# Patient Record
Sex: Female | Born: 1955 | Race: White | Hispanic: No | Marital: Single | Smoking: Current every day smoker
Health system: Southern US, Community
[De-identification: ages and names within clinical notes are randomized; demographics above are authoritative.]

## PROBLEM LIST (undated history)

## (undated) DIAGNOSIS — I272 Pulmonary hypertension, unspecified: Secondary | ICD-10-CM

## (undated) DIAGNOSIS — Z720 Tobacco use: Secondary | ICD-10-CM

## (undated) DIAGNOSIS — C50919 Malignant neoplasm of unspecified site of unspecified female breast: Secondary | ICD-10-CM

## (undated) DIAGNOSIS — E669 Obesity, unspecified: Secondary | ICD-10-CM

## (undated) DIAGNOSIS — M1990 Unspecified osteoarthritis, unspecified site: Secondary | ICD-10-CM

## (undated) DIAGNOSIS — J449 Chronic obstructive pulmonary disease, unspecified: Secondary | ICD-10-CM

## (undated) DIAGNOSIS — S0291XA Unspecified fracture of skull, initial encounter for closed fracture: Secondary | ICD-10-CM

## (undated) DIAGNOSIS — K449 Diaphragmatic hernia without obstruction or gangrene: Secondary | ICD-10-CM

## (undated) DIAGNOSIS — F32A Depression, unspecified: Secondary | ICD-10-CM

## (undated) DIAGNOSIS — F419 Anxiety disorder, unspecified: Secondary | ICD-10-CM

## (undated) DIAGNOSIS — R079 Chest pain, unspecified: Secondary | ICD-10-CM

## (undated) DIAGNOSIS — IMO0001 Reserved for inherently not codable concepts without codable children: Secondary | ICD-10-CM

## (undated) DIAGNOSIS — F329 Major depressive disorder, single episode, unspecified: Secondary | ICD-10-CM

## (undated) DIAGNOSIS — I10 Essential (primary) hypertension: Secondary | ICD-10-CM

## (undated) DIAGNOSIS — H9190 Unspecified hearing loss, unspecified ear: Secondary | ICD-10-CM

## (undated) DIAGNOSIS — I890 Lymphedema, not elsewhere classified: Secondary | ICD-10-CM

## (undated) DIAGNOSIS — K219 Gastro-esophageal reflux disease without esophagitis: Secondary | ICD-10-CM

## (undated) DIAGNOSIS — E785 Hyperlipidemia, unspecified: Secondary | ICD-10-CM

## (undated) HISTORY — DX: Chest pain, unspecified: R07.9

## (undated) HISTORY — DX: Tobacco use: Z72.0

## (undated) HISTORY — DX: Depression, unspecified: F32.A

## (undated) HISTORY — DX: Diaphragmatic hernia without obstruction or gangrene: K44.9

## (undated) HISTORY — PX: EYE SURGERY: SHX253

## (undated) HISTORY — PX: BACK SURGERY: SHX140

## (undated) HISTORY — DX: Unspecified fracture of skull, initial encounter for closed fracture: S02.91XA

## (undated) HISTORY — PX: TONSILLECTOMY: SUR1361

## (undated) HISTORY — DX: Essential (primary) hypertension: I10

## (undated) HISTORY — DX: Major depressive disorder, single episode, unspecified: F32.9

## (undated) HISTORY — DX: Unspecified hearing loss, unspecified ear: H91.90

## (undated) HISTORY — DX: Anxiety disorder, unspecified: F41.9

## (undated) HISTORY — DX: Obesity, unspecified: E66.9

## (undated) HISTORY — DX: Hyperlipidemia, unspecified: E78.5

## (undated) HISTORY — DX: Reserved for inherently not codable concepts without codable children: IMO0001

## (undated) HISTORY — DX: Lymphedema, not elsewhere classified: I89.0

---
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
## 1988-06-30 HISTORY — PX: MASTECTOMY: SHX3

## 1992-06-30 HISTORY — PX: TOTAL ABDOMINAL HYSTERECTOMY W/ BILATERAL SALPINGOOPHORECTOMY: SHX83

## 1994-06-30 HISTORY — PX: SHOULDER SURGERY: SHX246

## 1994-06-30 HISTORY — PX: CHOLECYSTECTOMY: SHX55

## 1994-06-30 HISTORY — PX: CARPAL TUNNEL RELEASE: SHX101

## 1997-06-30 HISTORY — PX: KNEE SURGERY: SHX244

## 2005-10-29 ENCOUNTER — Ambulatory Visit: Payer: Self-pay | Admitting: Internal Medicine

## 2005-11-12 ENCOUNTER — Ambulatory Visit: Payer: Self-pay | Admitting: Internal Medicine

## 2005-12-09 ENCOUNTER — Ambulatory Visit (HOSPITAL_COMMUNITY): Admission: RE | Admit: 2005-12-09 | Discharge: 2005-12-09 | Payer: Self-pay | Admitting: Internal Medicine

## 2005-12-09 ENCOUNTER — Ambulatory Visit: Payer: Self-pay | Admitting: Internal Medicine

## 2005-12-09 ENCOUNTER — Encounter (INDEPENDENT_AMBULATORY_CARE_PROVIDER_SITE_OTHER): Payer: Self-pay | Admitting: *Deleted

## 2006-01-19 ENCOUNTER — Ambulatory Visit: Payer: Self-pay | Admitting: Internal Medicine

## 2006-03-03 ENCOUNTER — Ambulatory Visit (HOSPITAL_COMMUNITY): Admission: RE | Admit: 2006-03-03 | Discharge: 2006-03-03 | Payer: Self-pay | Admitting: Internal Medicine

## 2006-04-23 ENCOUNTER — Ambulatory Visit: Payer: Self-pay | Admitting: Internal Medicine

## 2006-05-15 ENCOUNTER — Ambulatory Visit: Payer: Self-pay | Admitting: Internal Medicine

## 2006-05-15 ENCOUNTER — Ambulatory Visit (HOSPITAL_COMMUNITY): Admission: RE | Admit: 2006-05-15 | Discharge: 2006-05-15 | Payer: Self-pay | Admitting: Internal Medicine

## 2006-09-10 ENCOUNTER — Ambulatory Visit: Payer: Self-pay | Admitting: Internal Medicine

## 2006-10-14 ENCOUNTER — Emergency Department (HOSPITAL_COMMUNITY): Admission: EM | Admit: 2006-10-14 | Discharge: 2006-10-14 | Payer: Self-pay | Admitting: Emergency Medicine

## 2006-11-27 ENCOUNTER — Ambulatory Visit: Payer: Self-pay | Admitting: Cardiology

## 2006-12-01 ENCOUNTER — Ambulatory Visit: Payer: Self-pay | Admitting: Internal Medicine

## 2006-12-02 ENCOUNTER — Ambulatory Visit: Payer: Self-pay | Admitting: Cardiology

## 2006-12-02 ENCOUNTER — Inpatient Hospital Stay (HOSPITAL_BASED_OUTPATIENT_CLINIC_OR_DEPARTMENT_OTHER): Admission: RE | Admit: 2006-12-02 | Discharge: 2006-12-03 | Payer: Self-pay | Admitting: Cardiology

## 2006-12-10 ENCOUNTER — Ambulatory Visit: Payer: Self-pay | Admitting: Cardiology

## 2007-12-23 ENCOUNTER — Ambulatory Visit: Payer: Self-pay | Admitting: Internal Medicine

## 2008-02-04 ENCOUNTER — Encounter: Admission: RE | Admit: 2008-02-04 | Discharge: 2008-02-04 | Payer: Self-pay | Admitting: Obstetrics and Gynecology

## 2009-01-11 ENCOUNTER — Encounter: Payer: Self-pay | Admitting: Gastroenterology

## 2009-06-30 HISTORY — PX: COLONOSCOPY: SHX174

## 2010-11-12 NOTE — Assessment & Plan Note (Signed)
[facility]                          [facility]   JERILYNN, FELDMEIER                         MRN:          [id number]  DATE:12/10/2006                            DOB:          04/19/1956    Kathleen Burns returns after catheterization.  She is doing well.  See my  complete note dated Nov 27, 2006.  I saw her in the office then, and  there is an extensive dictation by Tereso Newcomer PA-C.  We decided to  proceed with catheterization.  This was done as an outpatient at [facility].  It was done on December 02, 2006.  Ejection fraction was normal.  Coronaries were normal.  There was no valvular disease.  It was felt  that her coronary status was quite good.  She was discharged home.  Since then, she has had no problems at her catheterization site.  She  has been stable, but still has some chest discomfort.  This is not  cardiac.   PAST MEDICAL HISTORY:   ALLERGIES:  1. PROCARDIA.  2. INDERAL.  3. ASPIRIN.  4. NSAIDS.  5. COGENTIN.  6. ARTANE.  7. PAXIL.  8. LEVAQUIN.  9. PENICILLIN.  10.DEMEROL.   MEDICATIONS:  Botox, Celebrex, Ambien, Ativan, Avinza, albuterol,  Xopenex, hydrochlorothiazide, Prilosec, Zocor.   OTHER MEDICAL PROBLEMS:  See the list on the note of Nov 27, 2006.   PHYSICAL EXAMINATION:  Blood pressure today is 138/95.  She needs blood  pressure followup.  Her heart rate is 87.   IMPRESSION:  1. Chest discomfort.  Not cardiac.  2. Catheterization done December 02, 2006 showing normal coronary arteries,      and normal left ventricular function.  No further cardiac workup is      needed.  3. Hypertension.  4. Hyperlipidemia.  5. Tobacco abuse.  6. Chronic obstructive pulmonary disease.  7. History of breast cancer, status post left mastectomy in 1990.  8. Aspirin intolerance.  9. History of blepharospasm and Meige syndrome followed by Dr.      Kate Sable at [facility].  10.History of depression and anxiety, and a previous  suicide attempt.  11.Gastroesophageal reflux disease.  12.Peptic ulcer disease.  13.Fatigue.  14.Hypersomnolence.   [name] is stable.  No further cardiac workup is needed at this time.  She is to return to her doctors in [place].     Luis Abed, MD, [facility]  Electronically Signed    JDK/MedQ  DD: 12/10/2006  DT: 12/10/2006  Job #: [id number]   cc:   Reynolds Bowl, MD

## 2010-11-12 NOTE — Assessment & Plan Note (Signed)
[facility]                          [facility]   [name], [name]                         MRN:          [id number]  DATE:11/27/2006                            DOB:          1955/09/24    REFERRING PHYSICIAN:  KRISTI POWERS, PA-C   CARDIOLOGIST:  She will be new to Dr. Myrtis Ser.   REASON FOR REFERRAL:  Chest pain.   HISTORY OF PRESENT ILLNESS:  [name] is a 55 year old female patient  with an extensive past medical history as outlined below.  She has no  proven coronary artery disease in the past.  She does tell me that she  had a heart catheterization in the late 1990s, as well as either in 2003  or 2004 in [place], [place].  She was previously seeing a cardiologist  in [place] named Dr. Dalbert Mayotte.  Apparently, she was told that she  had no blockages but possibly had spasm as a cause for her chest pain.  She had a stress test prior to her heart catheterization.  She is unsure  about the results for heart catheterization.  She recently saw her  primary care Izayiah Tibbitts with complaints of chest discomfort and she was  referred to [name] for further evaluation.   The patient reports a history that is somewhat consistent with chronic  chest discomfort; however, since March of this year she has noticed  worsening in her discomfort.  It is a left-sided pressure.  It does  radiate to her shoulder.  When asked what it feels like, she says it  feels like an elephant sitting on her chest.  She does note it with  exertion.  When asked what would happen when she goes up steps she says,  it feels like it will kill me.  She notes associated shortness of  breath, nausea, and diaphoresis.  She notes the pain will last from 2-20  minutes.  She notes it goes away with rest.  She denies any syncope.  She does feel lightheaded at times with it.  She does note that she can  sometimes make it worse with bending over.  She also notes tachy  palpitations associated with her chest discomfort.  She denies any  pleuritic chest pain.  She denies any association with meals.  Other  than the bending over, she denies any changes with positioning.   PAST MEDICAL HISTORY:  1. Prior cardiac catheterization in either 2003 or 2004 with,      apparently, no coronary disease.  Question of spasm.  Records      pending.  2. Hypertension.  3. Treated dyslipidemia.  4. Gastroesophageal reflux disease.  5. Peptic ulcer disease.  History of intolerance to ASPIRIN and NSAIDs      in the past.  6. Chronic obstructive pulmonary disease.  7. Breast carcinoma, status post left mastectomy 1990.  8. Depression/anxiety.  History of previous suicide attempt.  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  Followed by Dr.      Kate Sable at [facility].  10.Status post multiple surgeries including mastectomy of the  left      breast, back surgery 1998, cholecystectomy 1996, total abdominal      hysterectomy and bilateral salpingo-oophorectomy 1994, left      shoulder surgery in 1996, carpal tunnel surgery in 1996, left knee      surgery 1999, partial myomectomy bilateral eyes, 1999 and 2001, and      tonsillectomy.  11.Osteoarthritis.  12.History of skull fracture, 1969.  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   CURRENT MEDICATIONS:  1. Botox injections every 3 months.  2. Celebrex 100 mg b.i.d.  3. Ambien CR 12.5 mg nightly.  4. Ativan 1 mg 3 times a day.  5. Avinza 120 mg b.i.d.  6. Albuterol 2 puffs q.i.d.  7. Xopenex 1.25 mg q.8 h. p.r.n.  8. Hydrochlorothiazide 25 mg daily.  9. Prilosec 20 mg 2 tablets daily.  10.Zocor 20 mg daily.  11.Norco 10/325 mg p.r.n. pain  12.Tylenol p.r.n. pain.   ALLERGIES:  She has an extensive list including PROCARDIA, INDERAL  causing psychosis, ASPIRIN and ALL NSAIDs causing GI upset and  exacerbation of peptic ulcer disease, COGENTIN causing  rapid heartbeat,  ARTANE causing rapid heartbeat, PAXIL causing anxiety, LEVAQUIN causing  muscle weakness, PENICILLIN causing a rash, DEMEROL causing the opposite  effect.   SOCIAL HISTORY:  She smokes cigarettes 1-1/2 packs per day for 20 years  for a 30-pack-year history.  She denies alcohol abuse.  She did use  marijuana at some point, but denies any current drug abuse.  She is  disabled secondary to her extensive medical history.  She is a previous  case Production designer, theatre/television/film with [employer].  She is not married, she has no children.   FAMILY HISTORY:  Significant for coronary artery disease.  Her father  died at age 55 with a myocardial infarction.  Her mother died of a  pulmonary embolus post surgery.   REVIEW OF SYSTEMS:  Please see HPI.  She notes that she sleeps on 5  pillows.  She has done this for years.  She notes she has had to add  some extra pillows recently.  She denies paroxysmal nocturnal dyspnea.  She has gained about 50 pounds in the last 9 months.  She denies any  melena, hematochezia, hematuria, dysuria.  Denies any unilateral  weakness, monocular blindness, facial drooping or difficulty of speech.  She does have occasional edema in her legs.  Her friend that is with her  today does note that she snores.  There have been no witnessed apneic  episodes.  The patient does admit to awaking with headaches.  She does  also admit to daytime hypersomnolence.  The rest of the review of  systems are negative.   PHYSICAL EXAMINATION:  She is a well-nourished, well-developed female in  no distress.  Blood pressure 118/93, pulse 100, weight 249.8 pounds.  HEENT:  Normal.  NECK:  Without JVD, endocrine without thyromegaly, carotids without  bruits bilaterally.  CARDIAC:  Normal S1, S2.  Regular rate and rhythm without murmurs,  clicks, rubs, or gallops.  LUNGS:  Clear to auscultation bilaterally without wheezing, rhonchi, or  rales. ABDOMEN:  Soft, nontender, with normoactive bowel  sounds.  No  organomegaly and no bruits.  EXTREMITIES:  With 1-2+ nonpitting edema bilaterally.  Calves are soft,  nontender.  SKIN:  Warm and dry.  NEUROLOGIC:  She is alert and oriented x3.  Cranial nerves II-XII are  grossly intact.  Electrocardiogram reveals sinus rhythm with a heart rate of 96.  Normal  axis.  Nonspecific ST-T wave changes.   IMPRESSION:  1. Exertional chest discomfort, concerning for angina pectoris.  2. Prior history of cardiac catheterization in either 2003 or 2004.      Apparently negative at that time.      a.     Question vasospasm.      b.     Records pending.  3. Hypertension.  4. Hyperlipidemia.  5. Tobacco abuse.  6. Family history of coronary artery disease.  7. Chronic obstructive pulmonary disease.  8. Breast carcinoma, status post left mastectomy in 1990.  9. ASPIRIN intolerance.  10.History of blepharospasm and Meige syndrome, followed by Dr.      Kate Sable at [facility].  11.History of depression/anxiety with previous suicide attempt.  12.Multiple surgeries as outlined above.  13.Gastroesophageal reflux disease/peptic ulcer disease.  14.Fatigue and hypersomnolence.   PLAN:  The patient presents to the office today for evaluation of chest  pain.  She apparently had a workup in the past and was seeing a  cardiologist in [place], but does not prefer to go back to [facility].  Per her report, she had a negative cardiac catheterization  some 4-5 years ago.  However, she describes chest pain symptoms that are  fairly concerning for exertional angina pectoris.  She does have some  atypical features, but her typical features are much greater.  She does  have significant risk factors for coronary artery disease.  At this  point in time, we have recommended proceeding with cardiac  catheterization to rule out obstructive coronary artery disease.  We  will arrange for her to go to [facility] in [place] sometime next  week for  outpatient cardiac catheterization.  Risks and benefits have  been explained to the patient, she agrees to proceed.   After further evaluation with cardiac catheterization, we will need to  consider future evaluation for sleep apnea.  She certainly has the body  habitus for it, and is certainly describing symptoms consistent with  this.  Will consider this at followup appointment.      Tereso Newcomer, PA-C  Electronically Signed      Luis Abed, MD, [facility]  Electronically Signed   SW/MedQ  DD: 11/27/2006  DT: 11/27/2006  Job #: [id number]   cc:   Michaele Offer, PA-C

## 2010-11-12 NOTE — Assessment & Plan Note (Signed)
NAMEMarland Kitchen  Kathleen Burns, Kathleen Burns                   CHART#:  [id number]   DATE:  12/23/2007                       DOB:  Dec 11, 1955   REASON FOR VISIT:  Followup constipation, predominant IBS, and GERD.   HISTORY OF PRESENT ILLNESS:  The patient has done well on Prilosec 20 mg  orally b.i.d.  She was doing well on normal dosing of Amitiza 24 mcg  daily, but she ran out of this agent several months ago.  She had  negative cardiac evaluation by Dr. Collier Bullock at [place].  She has become  constipated once again, but again, her reflux symptoms are well  controlled.  She has been able to accomplish significant weight loss,  for which she was commended.  She weighed 249.5 on 12/01/2007.  She now  weighs 212 pounds.  She has not had any intercurrent illnesses.   CURRENT MEDICATIONS:  See updated list.   ALLERGIES:  Procardia, Inderal, aspirin, Cogentin, Paxil, Levaquin,  penicillin__________.   PHYSICAL EXAMINATION:  GENERAL:  She is accompanied by her partner.  VITAL SIGNS:  Weight 212, height 5 feet 5 inches, temp 98, BP 132/90,  and pulse 96.  SKIN:  Warm and dry.  ABDOMEN:  Somewhat obese.  Positive bowel sounds.  Soft and entirely  nontender without any appreciable mass or hepatosplenomegaly.   ASSESSMENT:  1. Gastroesophageal reflux disease symptoms, well controlled on      Prilosec.  2. Irritable bowel syndrome-C, previously responsive to Amitiza, 24-      mcg dose was a bit much for daily dosing for this nice lady.   RECOMMENDATIONS:  We will begin Amitiza 8 mcg b.i.d.  She is to take it  with meals, i.e., breakfast and supper, warned about headache, nausea,  and diarrhea.  I have also given her prescription as well as samples.  Unless something comes up, plan to see this nice lady back in a year and  will slate her for a routine screening colonoscopy in 2017.      Jonathon Bellows, M.D.  Electronically Signed    RMR/MEDQ  D:  12/23/2007  T:  12/24/2007  Job:  [id number]   cc:   Abbey Chatters.  Jorene Guest, MD

## 2010-11-12 NOTE — Assessment & Plan Note (Signed)
NAMEDEVINN, VOSHELL                   CHART#:  [id number]   DATE:  12/01/2006                       DOB:  [birth year]   Last seen on September 10, 2006.  Follow up for constipation, predominant  IBS, GERD.   We started her on some Amitiza 1 24 mcg tablet each day with food.  She  took it for about four days, got diarrhea, and stopped taking it;  however, she has learned that she ends up taking it once daily 2-3 days  in a row every 3-4 days on a p.r.n. basis for constipation.  This has  worked very well for her.  She is having on the order of 3-4 bowel  movements weekly and is very pleased, really not having any abdominal  discomfort.  Reflux symptoms are controlled on Prilosec OTC 20 mg orally  b.i.d.  She has developed a new problem since March, namely that of  dyspnea and chest pain on exertion on walking as little as 50 feet at  one time.  She has seen Dr. Myrtis Ser of [facility] up in [place].  She  is slated to have a cardiac catheterization tomorrow down in [place].  Has not seen Dr. Jorene Guest recently.  All in all from a chest standpoint,  she is doing well.  She has lost 2-1/2 pounds since her last visit.   CURRENT MEDICATIONS:  See updated list.   ALLERGIES:  PROCARDIA, INDERAL, ASA, COGENTIN, ARTANE, PAXIL, LEVAQUIN,  PENICILLIN, DEMEROL.   PHYSICAL EXAMINATION:  VITAL SIGNS:  She appears at her baseline weight  of 249.5.  Height 5 feet 6.  Temp 97.7, BP 130/80, pulse 80.  SKIN:  Warm and dry.  ABDOMEN:  Obese.  Positive bowel sounds.  Soft, nontender without  appreciable mass or organomegaly.   ASSESSMENT:  Gastroesophageal reflux disease symptoms that are  responsive to Prilosec 20 mg OTC b.i.d., constipation, predominant IBS,  now on a novel regimen of p.r.n. Amitiza use.  As long as Amitiza is  working for her in this manner, I see no reason why she cannot continue  taking it on a p.r.n. basis.  I have encouraged weight loss, antireflux,  lifestyle, continue Prilosec  20 mg OTC b.i.d. for as long as needed.  Unless something comes up, plan to see this nice lady back in the office  in one year.       Jonathon Bellows, M.D.  Electronically Signed    RMR/MEDQ  D:  12/01/2006  T:  12/01/2006  Job:  [id number]   cc:   Dr. Reynolds Bowl

## 2010-11-12 NOTE — Cardiovascular Report (Signed)
NAME:  AKIRE, RENNERT NO.:  [id number]   MEDICAL RECORD NO.:  [id number]          PATIENT TYPE:  OIB   LOCATION:  NA                           FACILITY:  [facility]   PHYSICIAN:  Salvadore Farber, MD  DATE OF BIRTH:  1956-02-11   DATE OF PROCEDURE:  12/02/2006  DATE OF DISCHARGE:                            CARDIAC CATHETERIZATION   PROCEDURES:  1. Left heart catheterization.  2. Left ventriculography.  3. Coronary angiography.   INDICATIONS:  Ms. Nester is a 55 year old woman with longstanding chest  pain occurring primarily at rest.  However, over the past 3 months she  has had substernal chest pressure with exertion.  In addition, she has  risk factors of hypertension, hypercholesterolemia, tobacco abuse and a  borderline family history of father dying of a myocardial infarction at  age 24.   PROCEDURAL TECHNIQUE:  Informed consent was obtained.  Under 1%  lidocaine local anesthesia, a 4-French sheath was placed in the right  common femoral artery using the modified Seldinger technique.  Diagnostic angiography and ventriculography were performed using JL-4,  JR-4, and pigtail catheters.  The patient tolerated the procedure well  and was transferred to the holding room in stable condition.  Sheaths  will be removed there.   COMPLICATIONS:  None.   FINDINGS:  1. LV:  138/0/6.  EF 65% without regional wall motion abnormality.  2. No aortic stenosis or mitral regurgitation.  3. Left main:  Angiographically normal.  4. LAD:  Moderate-sized vessel giving rise to two diagonals.  It is      angiographically normal.  5. Circumflex:  Moderate-sized vessel giving rise to a single      marginal.  It is angiographically normal.  6. RCA:  Moderate-sized, dominant vessel.  It is angiographically      normal.   IMPRESSION/PLAN:  Normal coronary arteries and normal left ventricular  size and systolic function.  There is no aortic stenosis or mitral  regurgitation.  The  patient will follow up with HER primary care  physician.      Salvadore Farber, MD  Electronically Signed     WED/MEDQ  D:  12/02/2006  T:  12/02/2006  Job:  [id number]

## 2010-11-15 NOTE — Op Note (Signed)
NAME:  Kathleen Burns, Kathleen Burns                ACCOUNT NO.:  [id number]   MEDICAL RECORD NO.:  [id number]          PATIENT TYPE:  AMB   LOCATION:  DAY                           FACILITY:  [facility]   PHYSICIAN:  R. Roetta Sessions, M.D. DATE OF BIRTH:  [birth year]   DATE OF PROCEDURE:  12/09/2005  DATE OF DISCHARGE:                                 OPERATIVE REPORT   PROCEDURE:  EGD and Maloney dilation followed by incomplete colonoscopy,  segmental biopsy, stool sampling.   INDICATIONS FOR PROCEDURE:  Patient is a 55 year old lady with esophageal  dysphagia in a background setting of gastroesophageal reflux disease  symptoms.  She has a history of chronic diarrhea.  She is Hemoccult  negative.  EGD and colonoscopy now being done.  This procedure has been  discussed with the patient at length and its risks, benefits, and  alternatives have been reviewed.  Questions answered and consent given.  Please see documentation on medical record.   DESCRIPTION OF PROCEDURE:  O2 saturation, blood pressure, pulse,  respirations were monitored throughout the entirety of both procedures.  Conscious sedation for both procedures with Phenergan 25 mg IV __________ IV  push in divided doses.  Versed 7 mg, IV fentanyl 150 mcg in divided doses,  Cetacaine spray for topical oropharyngeal anesthesia.   INSTRUMENTS:  Olympus video chip system.   FINDINGS:  EGD examination of esophagus revealed a prominent but noncritical  picture.  Schatzki's ring, esophageal mucosa otherwise appeared normal.  EG  junction was easily traversed.   Stomach:  Gastric cavity was empty and insufflated well with air.  Thorough  examination of the gastric mucosa including retroflexed view of the proximal  stomach, esophagogastric junction demonstrated only a moderate sized hiatal  hernia.  Pylorus was patent and easily traversed.  Examination of bulb and  second portion revealed no abnormalities.   THERAPY/DIAGNOSTIC MANEUVERS PERFORMED:  A 56  French Maloney dilator was  passed to full insertion with ease but revealed the ring remained intact.  Subsequently a 44 French Maloney dilator was passed with moderate  resistance.  Upon full insertion, a look-back revealed the ring again  remained intact.  Subsequently four quadrant bites of the ring with the  biopsy forceps were taken to disrupt it.  This was done without difficulty.  Patient tolerated the procedure well and was prepared for colonoscopy.  Digital rectal exam revealed no abnormalities.   ENDOSCOPIC FINDINGS:  Prep was marginal.   Rectum:  Examination of the rectal mucosa including retroflexed view of the  anal verge revealed no abnormalities.   Colon:  Colonic mucosa was surveyed from the rectosigmoid junction into the  descending colon.  Even though the procedure was going smoothly and she had  a large amount of sedation, the patient became somewhat combative and poorly  tolerant of the procedure.  I elected not to pursue the cecum any further as  she had a colonoscopy elsewhere within the last year.  Segmental biopsies of  the descending and sigmoid as well as rectal mucosa were taken for  histologic study.  Also stool residue was suctioned  out for microbiology  studies.  The patient did have left-sided diverticula.  The remainder of the  colonic mucosa to the mid descending colon appeared normal.  Patient overall  tolerated both procedures fairly well as reactive.   ENDOSCOPY IMPRESSION:  1.  Esophagogastroduodenoscopy:  Schatzki's ring, status post dilatation and      disruption as described above.  Otherwise normal esophagus.  Moderate      sized hiatal hernia, otherwise normal stomach, normal D1 and D2.  2.  Colonoscopy:  Marginally poor prep, appreciably a normal-appearing      rectum.  Left-sided diverticula.  Exam incomplete as described above.      Segmental biopsy and stool sample take.   RECOMMENDATIONS:  1.  Increase Questran to 4 g orally daily  because 2 g is met with      significant improvement in malfunction.  She is not to take Questran      within two hours of other medications.  2.  She is to continue Prilosec 20 mg orally twice daily.  3.  Follow up on pathology.  4.  Follow up on stool studies.  5.  Further recommendations to follow.      Jonathon Bellows, M.D.  Electronically Signed     RMR/MEDQ  D:  12/09/2005  T:  12/09/2005  Job:  [id number]   cc:   Reynolds Bowl, M.D.  [place], [place]

## 2010-11-15 NOTE — Op Note (Signed)
NAME:  Kathleen Burns, Kathleen Burns                ACCOUNT NO.:  [id number]   MEDICAL RECORD NO.:  [id number]          PATIENT TYPE:  AMB   LOCATION:  DAY                           FACILITY:  [facility]   PHYSICIAN:  R. Roetta Sessions, M.D. DATE OF BIRTH:  [birth year]   DATE OF PROCEDURE:  05/15/2006  DATE OF DISCHARGE:                                 OPERATIVE REPORT   Diagnostic colonoscopy.   INDICATIONS FOR PROCEDURE:  The patient is a 55 year old lady with  constipation predominant irritable bowel syndrome. Prior colonoscopy  incomplete and poorly tolerated in part secondary to poor prep in her  polypharmacy requiring large amounts of conscious sedation.  Because of her  multiple allergies, she was able to be given Demerol.  She underwent air-  contrast barium which revealed some filling defects transverse colon.  We  are bringing her back now for a colonoscopy under MAC.  This approach has  discussed with the patient at length.  Potential risks, benefits and  alternatives have been reviewed, questions answered.  She is agreeable.  Please see documentation in medical record.   PROCEDURE NOTE:  O2 saturation, blood pressure, pulse, respirations  monitored throughout the entire procedure.  Propofol MAC administered by Dr.  Tollie Eth and associates.   INSTRUMENT:  Olympus video chip system.   FINDINGS:  Digital rectal exam revealed no abnormalities.   ENDOSCOPIC FINDINGS:  The prep was suboptimal but very doable.  Rectum:  Examination rectal mucosa retroflex view anal verge revealed no  mucosal abnormalities.  Colon:  Colonic mucosa was surveyed from rectosigmoid junction to the left,  transverse, right colon, area appendiceal orifice, ileocecal valve and  cecum.  These structures well seen photographed for the record.  From this  level scope slowly withdrawn, all previous mentioned mucosal surfaces were  again seen.  The patient a long redundant tortuous colon.  The patient had  extensive left-sided  diverticula.  However, the colonic mucosa otherwise  appeared normal.  There was some granular stool throughout the colon which  was washed and suctioned out really without much difficulty.  I feel the  colonic mucosa was well seen.  The patient tolerated the procedure well was  taken to recovery room good condition.   IMPRESSION:  1. Normal rectum.  2. Long tortuous redundant colon, extensive left-sided diverticula,      remainder colonic mucosa appeared normal.   RECOMMENDATIONS:  1. Begin Benefiber or Citrucel fiber supplement.  2. Add MiraLax 70 grams orally bedtime p.r.n. constipation.  Follow up      appointment with [name] in 4 to 6 weeks.      Jonathon Bellows, M.D.  Electronically Signed     RMR/MEDQ  D:  05/15/2006  T:  05/15/2006  Job:  [id number]

## 2010-11-15 NOTE — H&P (Signed)
NAME:  MELLONIE, GUESS                ACCOUNT NO.:  [id number]   MEDICAL RECORD NO.:  [id number]          PATIENT TYPE:  AMB   LOCATION:                                FACILITY:  [facility]   PHYSICIAN:  R. Roetta Sessions, M.D. DATE OF BIRTH:  Aug 13, 1955   DATE OF ADMISSION:  04/23/2006  DATE OF DISCHARGE:  LH                                HISTORY & PHYSICAL   CHIEF COMPLAINT:  Abnormal BE.   HISTORY:  Patient is a pleasant 55 year old Caucasian female with irritable  bowel syndrome who underwent an EGD and colonoscopy back on December 09, 2005.  She had dysphagia of the Schatzki ring and was dilated.  Colonoscopy  revealed a marginal prep.  Cecum was note seen.  She was poorly tolerant of  the procedure with Versed and Fentanyl (allergic to Demerol).  Because of  the a poor prep and poor tolerance, the exam count not be completed.  We  complemented the colonoscopy with an air-contrast barium enema which  revealed some filling defects in the transverse colon which were not felt to  be stool and were felt to be clinically significant.  I did review this  study personally with Dr. Alver Fisher.  We then attempted to get a virtual  colonoscopy but because of third-party payment restraints in a patient's  financial obligation if this were to be done; made it not feasible.  Ms.  Lafayette Dragon is back today saying that she is really doing well from a GI  standpoint.  She does have alternating constipation and diarrhea, but all-in-  all her symptoms are more or less the middle of the road now.  She does take  a fiber supplement daily; and is taking Prilosec OTC 20 mg orally b.i.d.  with excellent control of her reflux symptoms.  We discussed the abnormal BE  and the importance of thoroughly imaging her colon after some discussion we  mutually felt that it would be best if we attempted to go ahead and take  extra pains for a good colon prep and do her colonoscopy under Propofol  sedation in the operating room.  In  addition, stool studies from a prior  colonoscopy as well as biopsy of the left colon failed to demonstrate any  infection or evidence of mucosal inflammation.  She has been Hemoccult  negative.   PAST MEDICAL HISTORY:  1. Left blepharospasm.  2. __________ syndrome.  3. Hearing impaired.  4. Degenerative arthritis.  5. Asthma.  6. COPD.  7. Gastroesophageal reflux.  8. Hiatal hernia.  9. Chronic low back pain.  10.Cholecystectomy.  11.Hysterectomy.  12.Left shoulder surgery.  13.Left hand carpal tunnel release.  14.Skull fracture.  15.Left knee surgery.  16.Partial myectomy both eyes.  17.Tonsillectomy.  18.History of breast cancer, status post left mastectomy in 1999 and      chemotherapy.   CURRENT MEDICATIONS:  1. Prilosec 20 mg b.i.d.  2. Avinza 120 mg b.i.d.  3. Ativan 1 mg t.i.d.  4. Lyrica 75 mg b.i.d.  5. Hydrocodone/APAP 10/325 mg daily.  6. Celebrex __________ b.i.d.  7. Hydrochlorothiazide 12.5  mg daily.  8. Ambien CR 12.5 mg at bedtime.  9. Albuterol 2 puffs q.i.d.  10.Botox injection every 3 months.  11.Tylenol p.r.n.  12.Xopenex 1.25 mg q.8 h.  13.Fiber supplement daily.   ALLERGIES:  PROCARDIA, INDERAL (causes psychosis), ASPIRIN, NSAIDS, COGENTIN  (rapid heard rate), ARTANE (rapid heart rate), PAXIL (anxiety and jittery),  LEVAQUIN (muscle weakness), PENICILLIN (rash), and DEMEROL (causes  combativeness).   FAMILY HISTORY:  Mother died in her 19s of blood clot.  Father died at age  32 of a MI.  No history of colorectal cancer.   SOCIAL HISTORY:  The patient is single.  She is disabled.  She still smokes,  no alcohol or illicit drug use.   REVIEW OF SYSTEMS:  No chest pain, shortness of breath, no recent change in  weight.   PHYSICAL EXAMINATION:  GENERAL:  A pleasant, 55 year old lady resting  comfortably.  VITAL SIGNS:  Weight 240.5, height 5 feet 6 inches, temperature 98.2, blood  pressure 114/86, pulse 72.  HEENT:  There is no jaundice.   No scleral icterus.  CHEST:  Lungs are clear to auscultation.  HEART:  Regular rate and rhythm without murmur, gallop, or rub.  BREASTS:  Exam deferred.  ABDOMEN:  Obese.  Positive bowel sounds, soft, nontender, without  appreciable mass or organomegaly.  RECTAL EXAM:  Deferred until the time of colonoscopy.   IMPRESSION:  Patient is a pleasant 55 year old lady who has abnormalities on  a barium enema which warrant further evaluation.  This is after a failed  complete colonoscopy.  Reasons for incomplete colonoscopy were outlined  above.  I think this lady, given her polypharmacy and poor tolerance to the  procedure would be best done in the operating room under Propofol and it  would indeed be worthwhile to go ahead and repeat her colonoscopy in the  setting of hopefully an excellent prep.  I feel doing a virtual colonoscopy  may raise more questions than it answers and we may still end up back to  square one contemplating a repeat colonoscopy; so, therefore, I have offered  this  patient a repeat colonoscopy under Propofol sedation in the operating room.  The potential risks, benefits, and alternatives have been reviewed, and  questions answered.  She is agreeable.  I will make plans to go this route  in the very near future.      Jonathon Bellows, M.D.  Electronically Signed     RMR/MEDQ  D:  04/23/2006  T:  04/24/2006  Job:  [id number]   cc:   [place]  [id number] [address], [facility]

## 2011-06-24 ENCOUNTER — Encounter: Payer: Self-pay | Admitting: *Deleted

## 2011-06-24 ENCOUNTER — Emergency Department (HOSPITAL_COMMUNITY): Payer: No Typology Code available for payment source

## 2011-06-24 ENCOUNTER — Emergency Department (HOSPITAL_COMMUNITY)
Admission: EM | Admit: 2011-06-24 | Discharge: 2011-06-24 | Disposition: A | Discharge status: Home / Self Care | Payer: No Typology Code available for payment source | Attending: Emergency Medicine | Admitting: Emergency Medicine

## 2011-06-24 ENCOUNTER — Other Ambulatory Visit: Payer: Self-pay

## 2011-06-24 DIAGNOSIS — F17200 Nicotine dependence, unspecified, uncomplicated: Secondary | ICD-10-CM | POA: Insufficient documentation

## 2011-06-24 DIAGNOSIS — K219 Gastro-esophageal reflux disease without esophagitis: Secondary | ICD-10-CM | POA: Insufficient documentation

## 2011-06-24 DIAGNOSIS — J4489 Other specified chronic obstructive pulmonary disease: Secondary | ICD-10-CM | POA: Insufficient documentation

## 2011-06-24 DIAGNOSIS — J449 Chronic obstructive pulmonary disease, unspecified: Secondary | ICD-10-CM | POA: Insufficient documentation

## 2011-06-24 DIAGNOSIS — R079 Chest pain, unspecified: Secondary | ICD-10-CM | POA: Insufficient documentation

## 2011-06-24 DIAGNOSIS — Z853 Personal history of malignant neoplasm of breast: Secondary | ICD-10-CM | POA: Insufficient documentation

## 2011-06-24 DIAGNOSIS — M1990 Unspecified osteoarthritis, unspecified site: Secondary | ICD-10-CM | POA: Insufficient documentation

## 2011-06-24 DIAGNOSIS — S20219A Contusion of unspecified front wall of thorax, initial encounter: Secondary | ICD-10-CM | POA: Insufficient documentation

## 2011-06-24 DIAGNOSIS — Z79899 Other long term (current) drug therapy: Secondary | ICD-10-CM | POA: Insufficient documentation

## 2011-06-24 HISTORY — DX: Unspecified osteoarthritis, unspecified site: M19.90

## 2011-06-24 HISTORY — DX: Chronic obstructive pulmonary disease, unspecified: J44.9

## 2011-06-24 HISTORY — DX: Gastro-esophageal reflux disease without esophagitis: K21.9

## 2011-06-24 HISTORY — DX: Malignant neoplasm of unspecified site of unspecified female breast: C50.919

## 2011-06-24 MED ORDER — METHOCARBAMOL 500 MG PO TABS: ORAL_TABLET | ORAL | Status: DC
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
## 2011-06-29 NOTE — ED Provider Notes (Signed)
Medical screening examination/treatment/procedure(s) were performed by non-physician practitioner and as supervising physician I was immediately available for consultation/collaboration. Devoria Albe, MD, FACEP   Ward Givens, MD 06/29/11 1000

## 2011-09-17 DIAGNOSIS — Z006 Encounter for examination for normal comparison and control in clinical research program: Secondary | ICD-10-CM | POA: Diagnosis not present

## 2011-09-17 DIAGNOSIS — G245 Blepharospasm: Secondary | ICD-10-CM | POA: Diagnosis not present

## 2011-10-22 DIAGNOSIS — G245 Blepharospasm: Secondary | ICD-10-CM | POA: Diagnosis not present

## 2011-10-22 DIAGNOSIS — Z006 Encounter for examination for normal comparison and control in clinical research program: Secondary | ICD-10-CM | POA: Diagnosis not present

## 2012-01-15 DIAGNOSIS — R0789 Other chest pain: Secondary | ICD-10-CM | POA: Diagnosis not present

## 2012-01-15 DIAGNOSIS — J449 Chronic obstructive pulmonary disease, unspecified: Secondary | ICD-10-CM | POA: Diagnosis not present

## 2012-01-15 DIAGNOSIS — E870 Hyperosmolality and hypernatremia: Secondary | ICD-10-CM | POA: Diagnosis not present

## 2012-01-15 DIAGNOSIS — I10 Essential (primary) hypertension: Secondary | ICD-10-CM | POA: Diagnosis not present

## 2012-01-15 DIAGNOSIS — R55 Syncope and collapse: Secondary | ICD-10-CM | POA: Diagnosis not present

## 2012-01-28 ENCOUNTER — Encounter: Payer: Self-pay | Admitting: *Deleted

## 2012-01-28 ENCOUNTER — Encounter: Payer: Self-pay | Admitting: Cardiology

## 2012-01-28 ENCOUNTER — Ambulatory Visit (INDEPENDENT_AMBULATORY_CARE_PROVIDER_SITE_OTHER): Payer: Medicare Other | Admitting: Cardiology

## 2012-01-28 VITALS — BP 118/88 | HR 108 | Ht 66.0 in | Wt 198.0 lb

## 2012-01-28 DIAGNOSIS — E785 Hyperlipidemia, unspecified: Secondary | ICD-10-CM | POA: Insufficient documentation

## 2012-01-28 DIAGNOSIS — C50919 Malignant neoplasm of unspecified site of unspecified female breast: Secondary | ICD-10-CM | POA: Insufficient documentation

## 2012-01-28 DIAGNOSIS — F329 Major depressive disorder, single episode, unspecified: Secondary | ICD-10-CM

## 2012-01-28 DIAGNOSIS — N289 Disorder of kidney and ureter, unspecified: Secondary | ICD-10-CM | POA: Diagnosis not present

## 2012-01-28 DIAGNOSIS — F17200 Nicotine dependence, unspecified, uncomplicated: Secondary | ICD-10-CM

## 2012-01-28 DIAGNOSIS — R000 Tachycardia, unspecified: Secondary | ICD-10-CM

## 2012-01-28 DIAGNOSIS — F419 Anxiety disorder, unspecified: Secondary | ICD-10-CM | POA: Insufficient documentation

## 2012-01-28 DIAGNOSIS — I890 Lymphedema, not elsewhere classified: Secondary | ICD-10-CM | POA: Insufficient documentation

## 2012-01-28 DIAGNOSIS — Z720 Tobacco use: Secondary | ICD-10-CM

## 2012-01-28 DIAGNOSIS — E669 Obesity, unspecified: Secondary | ICD-10-CM

## 2012-01-28 DIAGNOSIS — R079 Chest pain, unspecified: Secondary | ICD-10-CM | POA: Insufficient documentation

## 2012-01-28 DIAGNOSIS — N189 Chronic kidney disease, unspecified: Secondary | ICD-10-CM

## 2012-01-28 DIAGNOSIS — I10 Essential (primary) hypertension: Secondary | ICD-10-CM | POA: Diagnosis not present

## 2012-01-28 MED ORDER — AMLODIPINE BESYLATE 5 MG PO TABS: 5.0000 mg | ORAL_TABLET | Freq: Every day | ORAL | Status: DC

## 2012-01-28 NOTE — Progress Notes (Deleted)
Name: Kathleen Burns    DOB: 1955/09/08  Age: 56 y.o.  MR#: [id number]       PCP:  No primary provider on file.      Insurance: @PAYORNAME @   CC:    Chief Complaint  Patient presents with  . Acute Renal Failure    s/p hosp [facility] for syncope and renal failure - Med list reviewed/TC  . Loss of Consciousness    VS BP 118/88  Pulse 108  Ht 5\' 6"  (1.676 m)  Wt 198 lb (89.812 kg)  BMI 31.96 kg/m2  SpO2 98%  Weights Current Weight  01/28/12 198 lb (89.812 kg)  06/24/11 200 lb (90.719 kg)    Blood Pressure  BP Readings from Last 3 Encounters:  01/28/12 118/88  06/24/11 152/89     Admit date:  (Not on file) Last encounter with RMR:  Visit date not found   Allergy Allergies  Allergen Reactions  . Aspirin   . Cogentin (Benztropine Mesylate)   . Demerol   . Inderal (Propranolol Hcl)   . Levofloxacin   . Other     artane  . Penicillins   . Procardia (Nifedipine)   . Sulfa Antibiotics     Current Outpatient Prescriptions  Medication Sig Dispense Refill  . albuterol (PROAIR HFA) 108 (90 BASE) MCG/ACT inhaler Inhale 2 puffs into the lungs 4 (four) times daily as needed. For shortness of breath       . celecoxib (CELEBREX) 100 MG capsule Take 100 mg by mouth 2 (two) times daily.        . hydrochlorothiazide (HYDRODIURIL) 25 MG tablet Take 12.5 mg by mouth daily.       Marland Kitchen HYDROcodone-acetaminophen (NORCO) 10-325 MG per tablet Take 1 tablet by mouth every 6 (six) hours as needed. For pain       . lisinopril (PRINIVIL,ZESTRIL) 40 MG tablet Take 40 mg by mouth daily.      Marland Kitchen LORazepam (ATIVAN) 1 MG tablet Take 1 mg by mouth every 8 (eight) hours.        Marland Kitchen morphine (AVINZA) 120 MG 24 hr capsule Take 120 mg by mouth 2 (two) times daily.       Marland Kitchen omeprazole (PRILOSEC) 20 MG capsule Take 20 mg by mouth 2 (two) times daily.        . OnabotulinumtoxinA (BOTOX IJ) Inject 80 Units as directed every 3 (three) months.        . tiotropium (SPIRIVA) 18 MCG inhalation capsule Place 18 mcg into  inhaler and inhale daily.        Marland Kitchen UNABLE TO FIND Inject as directed every 3 (three) months. Xenomin      . zolpidem (AMBIEN CR) 12.5 MG CR tablet Take 12.5 mg by mouth at bedtime.          Discontinued Meds:    Medications Discontinued During This Encounter  Medication Reason  . methocarbamol (ROBAXIN) 500 MG tablet Discontinued by provider    Patient Active Problem List  Diagnosis  . Chest pain  . Hypertension  . Meige disease (lymphedema praecox)  . Breast cancer  . Hyperlipidemia  . Anxiety and depression  . Tobacco abuse    LABS No results found for any previous visit.   Results for this Opt Visit:    No results found for this or any previous visit.  EKG Orders placed during the hospital encounter of 06/24/11  . ED EKG  . ED EKG  . EKG     Prior  Assessment and Plan Problem List as of 01/28/2012            Cardiology Problems   Hypertension   Hyperlipidemia     Other   Chest pain   Meige disease (lymphedema praecox)   Breast cancer   Anxiety and depression   Tobacco abuse       Imaging: No results found.   FRS Calculation: Score not calculated. Missing: Total Cholesterol, HDL

## 2012-01-28 NOTE — Assessment & Plan Note (Signed)
Patient advised to discontinue cigarette smoking.  We will spend more time addressing this problem at future visits.

## 2012-01-28 NOTE — Assessment & Plan Note (Signed)
No prior record of lipid measurements available; a profile will be obtained.

## 2012-01-28 NOTE — Patient Instructions (Addendum)
Your physician recommends that you schedule a follow-up appointment in: 5 weeks  Your physician has requested that you have a renal artery duplex. During this test, an ultrasound is used to evaluate blood flow to the kidneys. Allow one hour for this exam. Do not eat after midnight the day before and avoid carbonated beverages. Take your medications as you usually do.  Your physician recommends that you return for lab work in: 1 month (you will receive a reminder letter)  Your physician has recommended you make the following change in your medication:  1 - START Amlodipine 2.5 mg daily (if upper number is > 160 or bottom number is >100, increase to 5 mg daily 2 - STOP HCTZ 3 - STOP Lisinopril  STOP Smoking

## 2012-01-28 NOTE — Assessment & Plan Note (Addendum)
Renal insufficiency at least partially appears to reflect the effect of medication.  Hyponatremia is also present, likely related to diuretic therapy.  ACE Inhibitor may be exacerbating chronic kidney disease.  Both of these medications will be discontinued and amlodipine substituted.  Patient will call if blood pressure control deteriorates.  Renal ultrasound will be performed to eliminate obvious structural urologic disease.  Renal function and electrolytes will be rechecked in one month.

## 2012-01-28 NOTE — Progress Notes (Signed)
Patient ID: Kathleen Burns, female   DOB: 1956/04/30, 55 y.o.   MRN: [id number]  HPI: Patient returns to the [facility] practice after a hiatus of 5 years for evaluation of near syncope, cardiomegaly and kidney disease.  She was last evaluated in 2008 for chest discomfort at which time cardiac catheterization revealed normal left ventricular systolic function and normal coronary angiography.  She has done well since then with no suggestion of cardiac problems.  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  EMS found a thready pulse and unobtainable blood pressure prompting administration of intravenous fluids and transported to [facility] where symptoms resolved after additional intravenous fluids.  Admission was advised, but she elected to leave against medical advice and has done well since.  Medications were not changed.  She continues to note mild orthostatic lightheadedness.  Records obtained from hospital and reviewed.  Creatinine was 1.98, but prior values are uncertain.  Approximately 4 years ago, she was told by her primary care doctor of a minor kidney problem.Chest x-ray showed cardiomegaly without acute abnormalities and reabsorption of the lateral aspect of left clavicle.  A large hiatal hernia was noted.  CT Scan of the head was negative.  Lab notable for sodium of 126, potassium of 5.2, BUN of 35 and creatinine of 1.98.  Glucose was 128.  CBC was normal.  EKG: Normal sinus rhythm with a single PVC, low voltage, abnormal R wave progression-likely lead placement her, otherwise normal.  Current Outpatient Prescriptions on File Prior to Visit  Medication Sig Dispense Refill  . albuterol (PROAIR HFA) 108 (90 BASE) MCG/ACT inhaler Inhale 2 puffs into the lungs 4 (four) times daily as needed. For shortness of breath       . celecoxib (CELEBREX) 100 MG capsule Take 100 mg by mouth 2 (two)  times daily.        Marland Kitchen HYDROcodone-acetaminophen (NORCO) 10-325 MG per tablet Take 1 tablet by mouth every 6 (six) hours as needed. For pain       . LORazepam (ATIVAN) 1 MG tablet Take 1 mg by mouth every 8 (eight) hours.        Marland Kitchen morphine (AVINZA) 120 MG 24 hr capsule Take 120 mg by mouth 2 (two) times daily.       Marland Kitchen omeprazole (PRILOSEC) 20 MG capsule Take 20 mg by mouth 2 (two) times daily.        . OnabotulinumtoxinA (BOTOX IJ) Inject 80 Units as directed every 3 (three) months.        . tiotropium (SPIRIVA) 18 MCG inhalation capsule Place 18 mcg into inhaler and inhale daily.        Marland Kitchen zolpidem (AMBIEN CR) 12.5 MG CR tablet Take 12.5 mg by mouth at bedtime.        Marland Kitchen amLODipine (NORVASC) 5 MG tablet Take 1 tablet (5 mg total) by mouth daily.  30 tablet  12   Allergies  Allergen Reactions  . Aspirin   . Cogentin (Benztropine Mesylate)   . Demerol   . Inderal (Propranolol Hcl)   . Levofloxacin   . Other     artane  . Penicillins   . Procardia (Nifedipine)   . Sulfa Antibiotics     Past Medical History  Diagnosis Date  . COPD (chronic obstructive pulmonary disease)   . Chest pain     Cardiac catheterization in 1999, 2003, 2008-normal EF, normal coronaries  .  GERD (gastroesophageal reflux disease)   . Hypertension   . Meige disease (lymphedema praecox)     blepharospasm; followed at [facility] And treated with botulinum toxin injections  . Osteoarthritis   . Breast cancer     Left mastectomy in 1990  . Hyperlipidemia   . Anxiety and depression     H/o suicide attempt  . Skull fracture 1969    surgical repair in 1969  . Hearing impairment   . Tobacco abuse     45 pack years    Past Surgical History  Procedure Date  . Mastectomy 1990    Left  . Back surgery 1998 and  . Eye surgery 1999, 2001  . Skull fracture elevation 1969  . Carpal tunnel release 1996  . Shoulder surgery 1996    Left  . Knee surgery 1999    Left  . Cholecystectomy 1996  . Total abdominal hysterectomy  w/ bilateral salpingoophorectomy 1994  . Tonsillectomy     Family History  Problem Relation Age of Onset  . Heart attack  59    History   Social History  . Marital Status: Single    Spouse Name: N/A    Number of Children: 0  . Years of Education: N/A   Occupational History  . Disabled     previously case Production designer, theatre/television/film with [employer]   Social History Main Topics  . Smoking status: Current Everyday Smoker -- 1.5 packs/day for 30 years  . Smokeless tobacco: Never Used  . Alcohol Use: No  . Drug Use: Yes     remote marijuana  . Sexually Active: Not on file   Other Topics Concern  . Not on file   Social History Narrative  . No narrative on file    ROS: Patient notes malaise and fatigue, hearing impairment, poor dentition, palpitations, dyspnea on exertion, constipation, negative colonoscopy in 2011, joint discomfort, chronic back pain, headaches, dizziness, insomnia, irritability, anxiety and seasonal allergies.   All other systems reviewed and are negative.  PHYSICAL EXAM: BP 118/88  Pulse 108  Ht 5\' 6"  (1.676 m)  Wt 89.812 kg (198 lb)  BMI 31.96 kg/m2  SpO2 98%   General-Well-developed; no acute distress Body Habitus-proportionate weight and height HEENT-[place]/AT; PERRL; EOM intact; conjunctiva and lids nl Neck-No JVD; no carotid bruits Endocrine-No thyromegaly Lungs-Clear lung fields; resonant percussion; normal I-to-E ratio Cardiovascular- normal PMI; normal S1 and S2 Abdomen-BS normal; soft and non-tender without masses or organomegaly Musculoskeletal-No deformities, cyanosis or clubbing Neurologic-Nl cranial nerves; symmetric strength and tone Skin- Warm, no significant lesions Extremities-Nl distal pulses; no edema  Rhythm Strip:   Sinus tachycardia at a rate of 100 bpm  ASSESSMENT AND PLAN:  Mustang Bing, MD 01/28/2012 3:38 PM

## 2012-01-28 NOTE — Assessment & Plan Note (Signed)
Patient assesses at home with generally good results.  Control appears adequate.

## 2012-02-02 ENCOUNTER — Other Ambulatory Visit: Payer: Self-pay | Admitting: Cardiology

## 2012-02-02 DIAGNOSIS — I10 Essential (primary) hypertension: Secondary | ICD-10-CM

## 2012-02-06 ENCOUNTER — Encounter (INDEPENDENT_AMBULATORY_CARE_PROVIDER_SITE_OTHER): Payer: Managed Care, Other (non HMO)

## 2012-02-06 DIAGNOSIS — I10 Essential (primary) hypertension: Secondary | ICD-10-CM | POA: Diagnosis not present

## 2012-02-12 ENCOUNTER — Telehealth: Payer: Self-pay | Admitting: Cardiology

## 2012-02-12 NOTE — Telephone Encounter (Signed)
Pt calling for renal artery test results done last week in [place]

## 2012-02-16 ENCOUNTER — Encounter: Payer: Self-pay | Admitting: Cardiology

## 2012-02-18 NOTE — Telephone Encounter (Signed)
Advised patient that test needed to be reviewed by physician and I would call her as soon as it is completed.

## 2012-02-27 ENCOUNTER — Other Ambulatory Visit: Payer: Self-pay | Admitting: *Deleted

## 2012-02-27 DIAGNOSIS — R000 Tachycardia, unspecified: Secondary | ICD-10-CM

## 2012-02-27 DIAGNOSIS — N289 Disorder of kidney and ureter, unspecified: Secondary | ICD-10-CM

## 2012-02-27 DIAGNOSIS — I10 Essential (primary) hypertension: Secondary | ICD-10-CM

## 2012-02-27 DIAGNOSIS — N189 Chronic kidney disease, unspecified: Secondary | ICD-10-CM

## 2012-03-02 ENCOUNTER — Other Ambulatory Visit: Payer: Self-pay | Admitting: Cardiology

## 2012-03-02 DIAGNOSIS — N289 Disorder of kidney and ureter, unspecified: Secondary | ICD-10-CM | POA: Diagnosis not present

## 2012-03-02 DIAGNOSIS — R000 Tachycardia, unspecified: Secondary | ICD-10-CM | POA: Diagnosis not present

## 2012-03-02 DIAGNOSIS — I10 Essential (primary) hypertension: Secondary | ICD-10-CM | POA: Diagnosis not present

## 2012-03-02 DIAGNOSIS — N189 Chronic kidney disease, unspecified: Secondary | ICD-10-CM | POA: Diagnosis not present

## 2012-03-03 ENCOUNTER — Other Ambulatory Visit: Payer: Self-pay | Admitting: *Deleted

## 2012-03-03 ENCOUNTER — Encounter: Payer: Self-pay | Admitting: *Deleted

## 2012-03-03 ENCOUNTER — Encounter: Payer: Self-pay | Admitting: Cardiology

## 2012-03-03 DIAGNOSIS — E875 Hyperkalemia: Secondary | ICD-10-CM

## 2012-03-03 LAB — URINALYSIS
Nitrite: POSITIVE — AB
Specific Gravity, Urine: 1.031 — ABNORMAL HIGH (ref 1.005–1.030)
Urobilinogen, UA: 0.2 mg/dL (ref 0.0–1.0)
pH: 5 (ref 5.0–8.0)

## 2012-03-03 LAB — LIPID PANEL: HDL: 49 mg/dL (ref >39)

## 2012-03-03 LAB — CBC
Platelets: 332 10*3/uL (ref 150–400)
RBC: 4.15 MIL/uL (ref 3.87–5.11)
RDW: 13.1 % (ref 11.5–15.5)
WBC: 8.4 10*3/uL (ref 4.0–10.5)

## 2012-03-03 LAB — URINALYSIS, MICROSCOPIC ONLY
Casts: NONE SEEN
Crystals: NONE SEEN

## 2012-03-03 LAB — COMPREHENSIVE METABOLIC PANEL
Albumin: 4.2 g/dL (ref 3.5–5.2)
CO2: 27 mEq/L (ref 19–32)
Chloride: 106 mEq/L (ref 96–112)
Glucose, Bld: 93 mg/dL (ref 70–99)
Potassium: 5.7 mEq/L — ABNORMAL HIGH (ref 3.5–5.3)
Sodium: 138 mEq/L (ref 135–145)
Total Protein: 6.8 g/dL (ref 6.0–8.3)

## 2012-03-03 LAB — TSH: TSH: 1.496 u[IU]/mL (ref 0.350–4.500)

## 2012-03-03 MED ORDER — ACETAMINOPHEN 325 MG PO TABS: 650.0000 mg | ORAL_TABLET | Freq: Four times a day (QID) | ORAL | Status: AC | PRN

## 2012-03-04 ENCOUNTER — Encounter: Payer: Self-pay | Admitting: Cardiology

## 2012-03-05 ENCOUNTER — Ambulatory Visit (INDEPENDENT_AMBULATORY_CARE_PROVIDER_SITE_OTHER): Payer: Medicare Other | Admitting: Cardiology

## 2012-03-05 ENCOUNTER — Encounter: Payer: Self-pay | Admitting: Cardiology

## 2012-03-05 VITALS — BP 116/84 | HR 105 | Ht 66.0 in | Wt 191.0 lb

## 2012-03-05 DIAGNOSIS — R079 Chest pain, unspecified: Secondary | ICD-10-CM

## 2012-03-05 DIAGNOSIS — E785 Hyperlipidemia, unspecified: Secondary | ICD-10-CM

## 2012-03-05 DIAGNOSIS — I10 Essential (primary) hypertension: Secondary | ICD-10-CM

## 2012-03-05 DIAGNOSIS — E875 Hyperkalemia: Secondary | ICD-10-CM

## 2012-03-05 LAB — URINE CULTURE: Colony Count: >=100000

## 2012-03-05 MED ORDER — CEPHALEXIN 500 MG PO CAPS: 500.0000 mg | ORAL_CAPSULE | Freq: Three times a day (TID) | ORAL | Status: AC

## 2012-03-05 MED ORDER — AMLODIPINE BESYLATE 10 MG PO TABS: 10.0000 mg | ORAL_TABLET | Freq: Every day | ORAL | Status: DC

## 2012-03-05 NOTE — Assessment & Plan Note (Signed)
Renal function has normalized with adjustment of antihypertensive medications.  Asymptomatic urinary tract infection will be treated with a 3 day course of antibiotics.  Organism was Escherichia coli and was pansensitive.

## 2012-03-05 NOTE — Progress Notes (Signed)
Patient ID: Kathleen Burns, female   DOB: December 29, 1955, 56 y.o.   MRN: [id number]  HPI: Scheduled return visit for this nice woman with hypertension and renal insufficiency.  Since her last visit, she has done fairly well.  She has occasional episodes of lightheadedness, mostly when she is over exerting.  These resolve with rest.  Careful monitoring of blood pressure at home reveals that more than half of systolics are greater than 140 and a substantial minority of diastolics greater than 90.  Prior to Admission medications   Medication Sig Start Date End Date Taking? Authorizing Provider  acetaminophen (TYLENOL) 325 MG tablet Take 2 tablets (650 mg total) by mouth every 6 (six) hours as needed for pain. 03/03/12 03/03/13 Yes Kathlen Brunswick, MD  albuterol (PROAIR HFA) 108 (90 BASE) MCG/ACT inhaler Inhale 2 puffs into the lungs 4 (four) times daily as needed. For shortness of breath    Yes Historical Provider, MD  amLODipine (NORVASC) 5 MG tablet Take 1 tablet (5 mg total) by mouth daily. 01/28/12 01/27/13 Yes Kathlen Brunswick, MD  HYDROcodone-acetaminophen (NORCO) 10-325 MG per tablet Take 1 tablet by mouth every 6 (six) hours as needed. For pain    Yes Historical Provider, MD  LORazepam (ATIVAN) 1 MG tablet Take 1 mg by mouth every 8 (eight) hours.     Yes Historical Provider, MD  morphine (AVINZA) 120 MG 24 hr capsule Take 120 mg by mouth 2 (two) times daily.    Yes Historical Provider, MD  omeprazole (PRILOSEC) 20 MG capsule Take 20 mg by mouth 2 (two) times daily.     Yes Historical Provider, MD  OnabotulinumtoxinA (BOTOX IJ) Inject 80 Units as directed every 3 (three) months.     Yes Historical Provider, MD  tiotropium (SPIRIVA) 18 MCG inhalation capsule Place 18 mcg into inhaler and inhale daily.     Yes Historical Provider, MD  UNABLE TO FIND Inject as directed every 3 (three) months. Xenomin   Yes Historical Provider, MD  zolpidem (AMBIEN CR) 12.5 MG CR tablet Take 12.5 mg by mouth at bedtime.      Yes Historical Provider, MD   Allergies  Allergen Reactions  . Artane (Trihexyphenidyl) Other (See Comments)    tachycardia  . Aspirin Other (See Comments)    + nonsteroidals-tachycardia  . Cogentin (Benztropine Mesylate) Other (See Comments)    Tachycardia   . Demerol Other (See Comments)    Delirium  . Inderal (Propranolol Hcl) Other (See Comments)    Psychosis  . Levofloxacin Other (See Comments)    Muscle weakness  . Paxil (Paroxetine Hcl) Other (See Comments)    Anxiety   . Procardia (Nifedipine)   . Sulfa Antibiotics   . Penicillins Rash     Past medical history, social history, and family history reviewed and updated.  ROS: Denies chest pain, palpitations, lightheadedness or syncope.  All other systems reviewed and are negative.  PHYSICAL EXAM: BP 116/84  Pulse 105  Ht 5\' 6"  (1.676 m)  Wt 86.637 kg (191 lb)  BMI 30.83 kg/m2  General-Well developed; no acute distress Body habitus-mildly overweight Neck-No JVD; no carotid bruits Lungs-clear lung fields; resonant to percussion Cardiovascular-normal PMI; normal S1 and S2 Abdomen-normal bowel sounds; soft and non-tender without masses or organomegaly Musculoskeletal-No deformities, no cyanosis or clubbing Neurologic-Normal cranial nerves; symmetric strength and tone Skin-Warm, no significant lesions Extremities-distal pulses intact; no edema  ASSESSMENT AND PLAN:   Bing, MD 03/05/2012 3:40 PM

## 2012-03-05 NOTE — Progress Notes (Deleted)
Name: Kathleen Burns    DOB: 07-12-1955  Age: 56 y.o.  MR#: [id number]       PCP:  Reynolds Bowl, MD      Insurance: @PAYORNAME @   CC:    Chief Complaint  Patient presents with  . Hypertension    No change in symptoms since last visit - Med list/TC - taking 2.5 mg of amlodipine, for the most part, however takes 5 mg  when pressure is up.    VS BP 116/84  Pulse 105  Ht 5\' 6"  (1.676 m)  Wt 191 lb (86.637 kg)  BMI 30.83 kg/m2  Weights Current Weight  03/05/12 191 lb (86.637 kg)  01/28/12 198 lb (89.812 kg)  06/24/11 200 lb (90.719 kg)    Blood Pressure  BP Readings from Last 3 Encounters:  03/05/12 116/84  01/28/12 118/88  06/24/11 152/89     Admit date:  (Not on file) Last encounter with RMR:  03/04/2012   Allergy Allergies  Allergen Reactions  . Artane (Trihexyphenidyl) Other (See Comments)    tachycardia  . Aspirin Other (See Comments)    + nonsteroidals-tachycardia  . Cogentin (Benztropine Mesylate) Other (See Comments)    Tachycardia   . Demerol Other (See Comments)    Delirium  . Inderal (Propranolol Hcl) Other (See Comments)    Psychosis  . Levofloxacin Other (See Comments)    Muscle weakness  . Paxil (Paroxetine Hcl) Other (See Comments)    Anxiety   . Procardia (Nifedipine)   . Sulfa Antibiotics   . Penicillins Rash    Current Outpatient Prescriptions  Medication Sig Dispense Refill  . acetaminophen (TYLENOL) 325 MG tablet Take 2 tablets (650 mg total) by mouth every 6 (six) hours as needed for pain.      Marland Kitchen albuterol (PROAIR HFA) 108 (90 BASE) MCG/ACT inhaler Inhale 2 puffs into the lungs 4 (four) times daily as needed. For shortness of breath       . amLODipine (NORVASC) 5 MG tablet Take 1 tablet (5 mg total) by mouth daily.  30 tablet  12  . HYDROcodone-acetaminophen (NORCO) 10-325 MG per tablet Take 1 tablet by mouth every 6 (six) hours as needed. For pain       . LORazepam (ATIVAN) 1 MG tablet Take 1 mg by mouth every 8 (eight) hours.        Marland Kitchen  morphine (AVINZA) 120 MG 24 hr capsule Take 120 mg by mouth 2 (two) times daily.       Marland Kitchen omeprazole (PRILOSEC) 20 MG capsule Take 20 mg by mouth 2 (two) times daily.        . OnabotulinumtoxinA (BOTOX IJ) Inject 80 Units as directed every 3 (three) months.        . tiotropium (SPIRIVA) 18 MCG inhalation capsule Place 18 mcg into inhaler and inhale daily.        Marland Kitchen UNABLE TO FIND Inject as directed every 3 (three) months. Xenomin      . zolpidem (AMBIEN CR) 12.5 MG CR tablet Take 12.5 mg by mouth at bedtime.          Discontinued Meds:   There are no discontinued medications.  Patient Active Problem List  Diagnosis  . Chest pain  . Hypertension  . Meige disease (lymphedema praecox)  . Breast cancer  . Hyperlipidemia  . Anxiety and depression  . Tobacco abuse  . Chronic kidney disease  . Obesity    LABS Orders Only on 03/02/2012  Component Date Value  .  Squamous Epithelial / LPF 03/02/2012 RARE   . Crystals 03/02/2012 NONE SEEN   . Casts 03/02/2012 NONE SEEN   . WBC, UA 03/02/2012 3-6*  . RBC / HPF 03/02/2012 0-2   . Bacteria, UA 03/02/2012 MANY*  . Colony Count 03/02/2012 >=100,000 COLONIES/ML   . Organism ID, Bacteria 03/02/2012 ESCHERICHIA COLI   Orders Only on 02/27/2012  Component Date Value  . Sodium 03/02/2012 138   . Potassium 03/02/2012 5.7*  . Chloride 03/02/2012 106   . CO2 03/02/2012 27   . Glucose, Bld 03/02/2012 93   . BUN 03/02/2012 21   . Creat 03/02/2012 1.00   . Total Bilirubin 03/02/2012 0.4   . Alkaline Phosphatase 03/02/2012 76   . AST 03/02/2012 13   . ALT 03/02/2012 <8   . Total Protein 03/02/2012 6.8   . Albumin 03/02/2012 4.2   . Calcium 03/02/2012 10.1   . WBC 03/02/2012 8.4   . RBC 03/02/2012 4.15   . Hemoglobin 03/02/2012 13.6   . HCT 03/02/2012 40.2   . MCV 03/02/2012 96.9   . [facility] 03/02/2012 32.8   . MCHC 03/02/2012 33.8   . RDW 03/02/2012 13.1   . Platelets 03/02/2012 332   . Color, Urine 03/02/2012 ORANGE*  . APPearance  03/02/2012 CLEAR   . Specific Gravity, Urine 03/02/2012 1.031*  . pH 03/02/2012 5.0   . Glucose, UA 03/02/2012 NEG   . Bilirubin Urine 03/02/2012 SMALL*  . Ketones, ur 03/02/2012 NEG   . Hgb urine dipstick 03/02/2012 SMALL*  . Protein, ur 03/02/2012 NEG   . Urobilinogen, UA 03/02/2012 0.2   . Nitrite 03/02/2012 POS*  . Leukocytes, UA 03/02/2012 SMALL*  . Cholesterol 03/02/2012 203*  . Triglycerides 03/02/2012 187*  . HDL 03/02/2012 49   . Total CHOL/HDL Ratio 03/02/2012 4.1   . VLDL 03/02/2012 37   . LDL Cholesterol 03/02/2012 117*  . TSH 03/02/2012 1.496      Results for this Opt Visit:     Results for orders placed in visit on 03/02/12  URINALYSIS, WITH MICROSCOPIC      Component Value Range   Squamous Epithelial / LPF RARE  RARE   Crystals NONE SEEN  NONE SEEN   Casts NONE SEEN  NONE SEEN   WBC, UA 3-6 (*) <3 WBC/hpf   RBC / HPF 0-2  <3 RBC/hpf   Bacteria, UA MANY (*) RARE  URINE CULTURE      Component Value Range   Colony Count >=100,000 COLONIES/ML     Organism ID, Bacteria ESCHERICHIA COLI      EKG Orders placed in visit on 02/13/12  . EKG     Prior Assessment and Plan Problem List as of 03/05/2012            Cardiology Problems   Hypertension   Last Assessment & Plan Note   01/28/2012 Office Visit Signed 01/28/2012  3:53 PM by Kathlen Brunswick, MD    Patient assesses at home with generally good results.  Control appears adequate.    Hyperlipidemia   Last Assessment & Plan Note   01/28/2012 Office Visit Signed 01/28/2012  3:52 PM by Kathlen Brunswick, MD    No prior record of lipid measurements available; a profile will be obtained.      Other   Chest pain   Meige disease (lymphedema praecox)   Breast cancer   Anxiety and depression   Tobacco abuse   Last Assessment & Plan Note   01/28/2012 Office Visit Signed  01/28/2012  4:10 PM by Kathlen Brunswick, MD    Patient advised to discontinue cigarette smoking.  We will spend more time addressing this  problem at future visits.    Chronic kidney disease   Last Assessment & Plan Note   01/28/2012 Office Visit Addendum 01/28/2012  3:56 PM by Kathlen Brunswick, MD    Renal insufficiency at least partially appears to reflect the effect of medication.  Hyponatremia is also present, likely related to diuretic therapy.  ACE Inhibitor may be exacerbating chronic kidney disease.  Both of these medications will be discontinued and amlodipine substituted.  Patient will call if blood pressure control deteriorates.  Renal ultrasound will be performed to eliminate obvious structural urologic disease.  Renal function and electrolytes will be rechecked in one month.    Obesity       Imaging: No results found.   FRS Calculation: Score not calculated. Missing: Total Cholesterol

## 2012-03-05 NOTE — Patient Instructions (Addendum)
Your physician recommends that you schedule a follow-up appointment in:  1 - 6 month follow up 2 - 1 month blood pressure check - Lab work will also be due at that time  Your physician has requested that you regularly monitor and record your blood pressure readings at home. Please use the same machine at the same time of day to check your readings and record them to bring to your follow-up visit.  Your physician has recommended you make the following change in your medication:  1 - OK to resume celebrex at previous dose 2 - INCREASE Amlodipine to 10 mg daily 3 - START Keflex 500 mg 3 x daily for 3 days

## 2012-03-12 ENCOUNTER — Telehealth: Payer: Self-pay | Admitting: *Deleted

## 2012-03-12 ENCOUNTER — Other Ambulatory Visit: Payer: Self-pay | Admitting: *Deleted

## 2012-03-12 DIAGNOSIS — E782 Mixed hyperlipidemia: Secondary | ICD-10-CM

## 2012-03-12 NOTE — Assessment & Plan Note (Signed)
Control of hypertension inadequate.  Amlodipine dosage will be increased, and patient asked to return in one month for a blood pressure check by the cardiology nurses.  She will monitor blood pressure at home in the interim.

## 2012-03-12 NOTE — Assessment & Plan Note (Signed)
No recent lipid profile-1 will be obtained.

## 2012-03-12 NOTE — Assessment & Plan Note (Signed)
No recent chest discomfort.  With negative coronary angiography 5 years ago, the likelihood that she currently has hemodynamically significant coronary disease is minimal.

## 2012-03-12 NOTE — Telephone Encounter (Signed)
Attempted to contact patient with recommendations from Dr Dietrich Pates to restrict potassium in her diet.  Will mail information.

## 2012-03-12 NOTE — Assessment & Plan Note (Signed)
Potassium restriction will be added to her diet and chemistry profile reassessed in one month.

## 2012-04-02 ENCOUNTER — Other Ambulatory Visit: Payer: Self-pay | Admitting: *Deleted

## 2012-04-02 DIAGNOSIS — E782 Mixed hyperlipidemia: Secondary | ICD-10-CM

## 2012-04-02 DIAGNOSIS — E875 Hyperkalemia: Secondary | ICD-10-CM

## 2012-04-07 DIAGNOSIS — G245 Blepharospasm: Secondary | ICD-10-CM | POA: Diagnosis not present

## 2012-04-09 ENCOUNTER — Encounter: Payer: Self-pay | Admitting: *Deleted

## 2012-04-12 ENCOUNTER — Ambulatory Visit (INDEPENDENT_AMBULATORY_CARE_PROVIDER_SITE_OTHER): Payer: Managed Care, Other (non HMO) | Admitting: *Deleted

## 2012-04-12 VITALS — BP 124/96 | HR 104 | Ht 66.0 in | Wt 190.1 lb

## 2012-04-12 DIAGNOSIS — I10 Essential (primary) hypertension: Secondary | ICD-10-CM | POA: Diagnosis not present

## 2012-04-13 ENCOUNTER — Encounter: Payer: Self-pay | Admitting: Cardiology

## 2012-04-13 LAB — BASIC METABOLIC PANEL
CO2: 28 mEq/L (ref 19–32)
Calcium: 10.3 mg/dL (ref 8.4–10.5)
Chloride: 98 mEq/L (ref 96–112)
Potassium: 5.1 mEq/L (ref 3.5–5.3)
Sodium: 137 mEq/L (ref 135–145)

## 2012-04-13 LAB — LIPID PANEL
HDL: 46 mg/dL (ref >39)
Total CHOL/HDL Ratio: 4.7 Ratio
VLDL: 47 mg/dL — ABNORMAL HIGH (ref 0–40)

## 2012-04-13 NOTE — Progress Notes (Signed)
Presents for blood pressure check.  Orthostatics completed .Meds reviewed and states taking medications, as prescribed.  Denies complaints.  BP at OV on 03/05/12 116/84 with HR of 105.

## 2012-04-13 NOTE — Progress Notes (Signed)
Patient ID: Kathleen Burns, female   DOB: [birth year], 56 y.o.   MRN: [id number]  Blood pressure control improved; continue current Rx.

## 2012-04-14 NOTE — Progress Notes (Signed)
Noted  

## 2012-05-07 ENCOUNTER — Telehealth: Payer: Self-pay | Admitting: Cardiology

## 2012-05-07 NOTE — Telephone Encounter (Signed)
Letter  Can be discarded

## 2012-05-07 NOTE — Telephone Encounter (Signed)
States that she got a letter stating it was time to have her labs drawn and wants to know what she is to have drawn.  I didn't see the order in the computer. / tg

## 2012-05-18 ENCOUNTER — Encounter: Payer: Self-pay | Admitting: Adult Health

## 2012-05-18 ENCOUNTER — Other Ambulatory Visit: Payer: Self-pay | Admitting: *Deleted

## 2012-05-18 ENCOUNTER — Ambulatory Visit (INDEPENDENT_AMBULATORY_CARE_PROVIDER_SITE_OTHER): Payer: Medicare Other | Admitting: Adult Health

## 2012-05-18 VITALS — BP 150/102 | HR 99 | Ht 66.0 in | Wt 190.0 lb

## 2012-05-18 DIAGNOSIS — Z720 Tobacco use: Secondary | ICD-10-CM

## 2012-05-18 DIAGNOSIS — I10 Essential (primary) hypertension: Secondary | ICD-10-CM | POA: Diagnosis not present

## 2012-05-18 DIAGNOSIS — E785 Hyperlipidemia, unspecified: Secondary | ICD-10-CM

## 2012-05-18 DIAGNOSIS — F17200 Nicotine dependence, unspecified, uncomplicated: Secondary | ICD-10-CM | POA: Diagnosis not present

## 2012-05-18 NOTE — Assessment & Plan Note (Signed)
Review of blood pressure log to include orthostatics reveals a blood pressure range of 136 over 92 to 104/70. Unfortunately she is orthostatics with a decrease of her blood pressure from the higher level lying down to the lower level with standing. However, at other times during the day her blood pressure remains normal with normal response to standing. She does not take her blood pressure medication the same time every day. And is often DZ with position changes. Average blood pressures in the 120 range systolically but I am seeing levels of 93/64 102/75.   Today she is elevated on her blood pressure in the 150s range. I am not going to make any changes at this time and asked her to begin her medication at the same time every day. She is with a partner who works nights, and therefore she is up all night and sleeps during the day as well. I have advised her to take her blood pressure medication within an hour after waking. She states she usually awakens around 12:00 in the afternoon. I have asked her to take it around 1 PM.  Review of results of renal artery ultrasound was negative for renal artery stenosis dated 02/06/2012. She had normal caliber abdominal aorta, asymmetrical kidney size right smaller by 2.1 cm in length. She had normal renal arteries bilaterally.  It does not appear that she has had an echocardiogram in the past. Will evaluate her LV function with an echocardiogram prior to her next visit.

## 2012-05-18 NOTE — Assessment & Plan Note (Signed)
Review of labs shows a cholesterol of 215, triglycerides of 236, HDL of 46, LDL 122. Unfortunately the patient ate prior to having her labs completed. I will not add medications at this time and repeat her class all status fasting in one month. She is given instructions on a low cholesterol diet.

## 2012-05-18 NOTE — Patient Instructions (Addendum)
Your physician recommends that you schedule a follow-up appointment in: ONE MONTH WITH KL  Your physician recommends that you return for lab work in: ONE MONTH (FASTING LIPIDS)   LAB REQUISITIONS WILL BE FAXED TO THE LAB FOR YOUR CONVEINENCE   Please go to [facility], located across the street from [facility] on the second floor. Hours are Monday - Friday 7am until 7:30pm Saturday - 8am until 12noon   __x_ DO NOT EAT OR DRINK AFTER MIDNIGHT EVENING PRIOR TO LABWORK  Please try to quit smoking, information has been included in your discharge summery  Please try a low cholesterol diet, information has been included in your discharge summery   Your physician has requested that you have an echocardiogram. Echocardiography is a painless test that uses sound waves to create images of your heart. It provides your doctor with information about the size and shape of your heart and how well your heart's chambers and valves are working. This procedure takes approximately one hour. There are no restrictions for this procedure.

## 2012-05-18 NOTE — Progress Notes (Signed)
HPI:  Kathleen Burns is a 56 year old patient of Dr. Rosenhayn Bing we are following for ongoing assessment treatment of hypertension, history of renal insufficiency, ongoing tobacco abuse, hyperlipidemia. She also has a history of mild anxiety. On last visit with Dr. Dietrich Pates dated 03/05/2012 the patient was started on higher dose of amlodipine from 5 mg increased to 10 mg daily. The patient was also scheduled for a renal artery ultrasound. For risk stratification the patient also had lipids and LFTs completed. She is here to discuss results, and bring a copy of her blood pressure log for review. Her main complaint at this time is some positional dizziness. On occasion when she stands up quickly or bends over she has noticed some mild vertigo. Otherwise she is medically compliant. Unfortunately she continues to smoke.  Allergies  Allergen Reactions  . Artane (Trihexyphenidyl) Other (See Comments)    tachycardia  . Aspirin Other (See Comments)    + nonsteroidals-tachycardia  . Cogentin (Benztropine Mesylate) Other (See Comments)    Tachycardia   . Demerol Other (See Comments)    Delirium  . Inderal (Propranolol Hcl) Other (See Comments)    Psychosis  . Levofloxacin Other (See Comments)    Muscle weakness  . Paxil (Paroxetine Hcl) Other (See Comments)    Anxiety   . Procardia (Nifedipine)   . Sulfa Antibiotics   . Penicillins Rash    Current Outpatient Prescriptions  Medication Sig Dispense Refill  . acetaminophen (TYLENOL) 325 MG tablet Take 2 tablets (650 mg total) by mouth every 6 (six) hours as needed for pain.      Marland Kitchen albuterol (PROAIR HFA) 108 (90 BASE) MCG/ACT inhaler Inhale 2 puffs into the lungs 4 (four) times daily as needed. For shortness of breath       . amLODipine (NORVASC) 10 MG tablet Take 1 tablet (10 mg total) by mouth daily.  30 tablet  11  . celecoxib (CELEBREX) 100 MG capsule Take 100 mg by mouth 2 (two) times daily.      . cetirizine (ZYRTEC) 10 MG tablet Take 10  mg by mouth as needed.      Marland Kitchen HYDROcodone-acetaminophen (NORCO) 10-325 MG per tablet Take 1 tablet by mouth every 6 (six) hours as needed. For pain       . LORazepam (ATIVAN) 1 MG tablet Take 1 mg by mouth every 8 (eight) hours.        Marland Kitchen morphine (AVINZA) 120 MG 24 hr capsule Take 120 mg by mouth 2 (two) times daily.       Marland Kitchen omeprazole (PRILOSEC) 20 MG capsule Take 20 mg by mouth 2 (two) times daily.        . OnabotulinumtoxinA (BOTOX IJ) Inject 80 Units as directed every 6 (six) months.       Marland Kitchen UNABLE TO FIND Inject as directed every 3 (three) months. Xenomin      . zolpidem (AMBIEN CR) 12.5 MG CR tablet Take 12.5 mg by mouth at bedtime.          Past Medical History  Diagnosis Date  . COPD (chronic obstructive pulmonary disease)     with bronchospasm  . Chest pain     Cardiac catheterization in 1999, 2003, 2008-normal EF, normal coronaries  . GERD (gastroesophageal reflux disease)   . Hypertension   . Meige disease (lymphedema praecox)     blepharospasm; followed at [facility] And treated with botulinum toxin injections  . Osteoarthritis   . Breast cancer     Left  mastectomy in 1990s + chemotherapy  . Hyperlipidemia   . Anxiety and depression     H/o suicide attempt  . Skull fracture 1969    surgical repair in 1969  . Hearing impairment   . Tobacco abuse     45 pack years  . Obesity     Past Surgical History  Procedure Date  . Mastectomy 1990    Left  . Back surgery 1998 and  . Eye surgery 1999, 2001  . Skull fracture elevation 1969  . Carpal tunnel release 1996  . Shoulder surgery 1996    Left  . Knee surgery 1999    Left  . Cholecystectomy 1996  . Total abdominal hysterectomy w/ bilateral salpingoophorectomy 1994  . Tonsillectomy   . Colonoscopy 2011    No significant abnormalities    ZOX:WRUEAV of systems complete and found to be negative unless listed above  PHYSICAL EXAM BP 150/102  Pulse 99  Ht 5\' 6"  (1.676 m)  Wt 190 lb (86.183 kg)  BMI 30.67 kg/m2   SpO2 93%  General: Well developed, well nourished, in no acute distress, dental caries prominent Head: Eyes PERRLA, No xanthomas.   Normal cephalic and atramatic  Lungs: Clear bilaterally to auscultation and percussion. Heart: HRRR S1 S2, soft S 4.with 1/6 systolic murmur. .  Pulses are 2+ & equal.            No carotid bruit. No JVD.  No abdominal bruits. No femoral bruits. Abdomen: Bowel sounds are positive, abdomen soft and non-tender without masses or                  Hernia's noted. Msk:  Back normal, normal gait. Normal strength and tone for age. Extremities: No clubbing, cyanosis or edema.  DP +1 Neuro: Alert and oriented X 3. Psych:  Good affect, responds appropriately  ASSESSMENT AND PLAN

## 2012-05-18 NOTE — Assessment & Plan Note (Signed)
I was spoken to her concerning smoking cessation. I have provided her with information on smoking cessation classes N/A quit line is available through [facility]. She verbalizes understanding.

## 2012-05-18 NOTE — Progress Notes (Deleted)
Name: Kathleen Burns    DOB: 1955/12/03  Age: 56 y.o.  MR#: [id number]       PCP:  Reynolds Bowl, MD      Insurance: @PAYORNAME @   CC:   No chief complaint on file.   VS BP 150/102  Pulse 99  Ht 5\' 6"  (1.676 m)  Wt 190 lb (86.183 kg)  BMI 30.67 kg/m2  SpO2 93%  Weights Current Weight  05/18/12 190 lb (86.183 kg)  04/12/12 190 lb 1.9 oz (86.238 kg)  03/05/12 191 lb (86.637 kg)    Blood Pressure  BP Readings from Last 3 Encounters:  05/18/12 150/102  04/13/12 124/96  03/05/12 116/84     Admit date:  (Not on file) Last encounter with RMR:  Visit date not found   Allergy Allergies  Allergen Reactions  . Artane (Trihexyphenidyl) Other (See Comments)    tachycardia  . Aspirin Other (See Comments)    + nonsteroidals-tachycardia  . Cogentin (Benztropine Mesylate) Other (See Comments)    Tachycardia   . Demerol Other (See Comments)    Delirium  . Inderal (Propranolol Hcl) Other (See Comments)    Psychosis  . Levofloxacin Other (See Comments)    Muscle weakness  . Paxil (Paroxetine Hcl) Other (See Comments)    Anxiety   . Procardia (Nifedipine)   . Sulfa Antibiotics   . Penicillins Rash    Current Outpatient Prescriptions  Medication Sig Dispense Refill  . acetaminophen (TYLENOL) 325 MG tablet Take 2 tablets (650 mg total) by mouth every 6 (six) hours as needed for pain.      Marland Kitchen albuterol (PROAIR HFA) 108 (90 BASE) MCG/ACT inhaler Inhale 2 puffs into the lungs 4 (four) times daily as needed. For shortness of breath       . amLODipine (NORVASC) 10 MG tablet Take 1 tablet (10 mg total) by mouth daily.  30 tablet  11  . celecoxib (CELEBREX) 100 MG capsule Take 100 mg by mouth 2 (two) times daily.      . cetirizine (ZYRTEC) 10 MG tablet Take 10 mg by mouth as needed.      Marland Kitchen HYDROcodone-acetaminophen (NORCO) 10-325 MG per tablet Take 1 tablet by mouth every 6 (six) hours as needed. For pain       . LORazepam (ATIVAN) 1 MG tablet Take 1 mg by mouth every 8 (eight) hours.         Marland Kitchen morphine (AVINZA) 120 MG 24 hr capsule Take 120 mg by mouth 2 (two) times daily.       Marland Kitchen omeprazole (PRILOSEC) 20 MG capsule Take 20 mg by mouth 2 (two) times daily.        . OnabotulinumtoxinA (BOTOX IJ) Inject 80 Units as directed every 6 (six) months.       Marland Kitchen UNABLE TO FIND Inject as directed every 3 (three) months. Xenomin      . zolpidem (AMBIEN CR) 12.5 MG CR tablet Take 12.5 mg by mouth at bedtime.          Discontinued Meds:    Medications Discontinued During This Encounter  Medication Reason  . tiotropium (SPIRIVA) 18 MCG inhalation capsule Error    Patient Active Problem List  Diagnosis  . Chest pain  . Hypertension  . Meige disease (lymphedema praecox)  . Breast cancer  . Hyperlipidemia  . Anxiety and depression  . Tobacco abuse  . Chronic kidney disease  . Obesity  . Hyperkalemia    LABS Orders Only on 04/02/2012  Component  Date Value  . Sodium 04/02/2012 137   . Potassium 04/02/2012 5.1   . Chloride 04/02/2012 98   . CO2 04/02/2012 28   . Glucose, Bld 04/02/2012 90   . BUN 04/02/2012 22   . Creat 04/02/2012 1.09   . Calcium 04/02/2012 10.3   . Cholesterol 04/12/2012 215*  . Triglycerides 04/12/2012 236*  . HDL 04/12/2012 46   . Total CHOL/HDL Ratio 04/12/2012 4.7   . VLDL 04/12/2012 47*  . LDL Cholesterol 04/12/2012 122*  Orders Only on 03/02/2012  Component Date Value  . Squamous Epithelial / LPF 03/02/2012 RARE   . Crystals 03/02/2012 NONE SEEN   . Casts 03/02/2012 NONE SEEN   . WBC, UA 03/02/2012 3-6*  . RBC / HPF 03/02/2012 0-2   . Bacteria, UA 03/02/2012 MANY*  . Colony Count 03/02/2012 >=100,000 COLONIES/ML   . Organism ID, Bacteria 03/02/2012 ESCHERICHIA COLI   Orders Only on 02/27/2012  Component Date Value  . Sodium 03/02/2012 138   . Potassium 03/02/2012 5.7*  . Chloride 03/02/2012 106   . CO2 03/02/2012 27   . Glucose, Bld 03/02/2012 93   . BUN 03/02/2012 21   . Creat 03/02/2012 1.00   . Total Bilirubin 03/02/2012 0.4     . Alkaline Phosphatase 03/02/2012 76   . AST 03/02/2012 13   . ALT 03/02/2012 <8   . Total Protein 03/02/2012 6.8   . Albumin 03/02/2012 4.2   . Calcium 03/02/2012 10.1   . WBC 03/02/2012 8.4   . RBC 03/02/2012 4.15   . Hemoglobin 03/02/2012 13.6   . HCT 03/02/2012 40.2   . MCV 03/02/2012 96.9   . [facility] 03/02/2012 32.8   . MCHC 03/02/2012 33.8   . RDW 03/02/2012 13.1   . Platelets 03/02/2012 332   . Color, Urine 03/02/2012 ORANGE*  . APPearance 03/02/2012 CLEAR   . Specific Gravity, Urine 03/02/2012 1.031*  . pH 03/02/2012 5.0   . Glucose, UA 03/02/2012 NEG   . Bilirubin Urine 03/02/2012 SMALL*  . Ketones, ur 03/02/2012 NEG   . Hgb urine dipstick 03/02/2012 SMALL*  . Protein, ur 03/02/2012 NEG   . Urobilinogen, UA 03/02/2012 0.2   . Nitrite 03/02/2012 POS*  . Leukocytes, UA 03/02/2012 SMALL*  . Cholesterol 03/02/2012 203*  . Triglycerides 03/02/2012 187*  . HDL 03/02/2012 49   . Total CHOL/HDL Ratio 03/02/2012 4.1   . VLDL 03/02/2012 37   . LDL Cholesterol 03/02/2012 117*  . TSH 03/02/2012 1.496      Results for this Opt Visit:     Results for orders placed in visit on 04/02/12  BASIC METABOLIC PANEL      Component Value Range   Sodium 137  135 - 145 mEq/L   Potassium 5.1  3.5 - 5.3 mEq/L   Chloride 98  96 - 112 mEq/L   CO2 28  19 - 32 mEq/L   Glucose, Bld 90  70 - 99 mg/dL   BUN 22  6 - 23 mg/dL   Creat 1.61  0.96 - 0.45 mg/dL   Calcium 40.9  8.4 - 81.1 mg/dL  LIPID PANEL      Component Value Range   Cholesterol 215 (*) 0 - 200 mg/dL   Triglycerides 914 (*) <150 mg/dL   HDL 46  >78 mg/dL   Total CHOL/HDL Ratio 4.7     VLDL 47 (*) 0 - 40 mg/dL   LDL Cholesterol 295 (*) 0 - 99 mg/dL    EKG Orders placed in  visit on 02/13/12  . EKG     Prior Assessment and Plan Problem List as of 05/18/2012            Cardiology Problems   Hypertension   Last Assessment & Plan Note   03/05/2012 Office Visit Signed 03/12/2012  8:26 AM by Kathlen Brunswick, MD     Control of hypertension inadequate.  Amlodipine dosage will be increased, and patient asked to return in one month for a blood pressure check by the cardiology nurses.  She will monitor blood pressure at home in the interim.    Hyperlipidemia   Last Assessment & Plan Note   03/05/2012 Office Visit Signed 03/12/2012  8:26 AM by Kathlen Brunswick, MD    No recent lipid profile-1 will be obtained.      Other   Chest pain   Last Assessment & Plan Note   03/05/2012 Office Visit Signed 03/12/2012  8:23 AM by Kathlen Brunswick, MD    No recent chest discomfort.  With negative coronary angiography 5 years ago, the likelihood that she currently has hemodynamically significant coronary disease is minimal.    Meige disease (lymphedema praecox)   Breast cancer   Anxiety and depression   Tobacco abuse   Last Assessment & Plan Note   01/28/2012 Office Visit Signed 01/28/2012  4:10 PM by Kathlen Brunswick, MD    Patient advised to discontinue cigarette smoking.  We will spend more time addressing this problem at future visits.    Chronic kidney disease   Last Assessment & Plan Note   03/05/2012 Office Visit Signed 03/05/2012  3:44 PM by Kathlen Brunswick, MD    Renal function has normalized with adjustment of antihypertensive medications.  Asymptomatic urinary tract infection will be treated with a 3 day course of antibiotics.  Organism was Escherichia coli and was pansensitive.    Obesity   Hyperkalemia   Last Assessment & Plan Note   03/05/2012 Office Visit Signed 03/12/2012  8:25 AM by Kathlen Brunswick, MD    Potassium restriction will be added to her diet and chemistry profile reassessed in one month.        Imaging: No results found.   FRS Calculation: Score not calculated. Missing: Total Cholesterol

## 2012-05-20 ENCOUNTER — Other Ambulatory Visit (HOSPITAL_COMMUNITY): Payer: Managed Care, Other (non HMO)

## 2012-05-26 ENCOUNTER — Ambulatory Visit (HOSPITAL_COMMUNITY)
Admission: RE | Admit: 2012-05-26 | Discharge: 2012-05-26 | Disposition: A | Discharge status: Home / Self Care | Payer: Managed Care, Other (non HMO) | Source: Ambulatory Visit | Attending: Adult Health | Admitting: Adult Health

## 2012-05-26 DIAGNOSIS — N189 Chronic kidney disease, unspecified: Secondary | ICD-10-CM | POA: Insufficient documentation

## 2012-05-26 DIAGNOSIS — I129 Hypertensive chronic kidney disease with stage 1 through stage 4 chronic kidney disease, or unspecified chronic kidney disease: Secondary | ICD-10-CM | POA: Insufficient documentation

## 2012-05-26 DIAGNOSIS — I079 Rheumatic tricuspid valve disease, unspecified: Secondary | ICD-10-CM | POA: Insufficient documentation

## 2012-05-26 DIAGNOSIS — E785 Hyperlipidemia, unspecified: Secondary | ICD-10-CM | POA: Insufficient documentation

## 2012-05-26 DIAGNOSIS — I059 Rheumatic mitral valve disease, unspecified: Secondary | ICD-10-CM | POA: Diagnosis not present

## 2012-05-26 DIAGNOSIS — I10 Essential (primary) hypertension: Secondary | ICD-10-CM

## 2012-05-26 DIAGNOSIS — F17200 Nicotine dependence, unspecified, uncomplicated: Secondary | ICD-10-CM | POA: Insufficient documentation

## 2012-05-26 DIAGNOSIS — I369 Nonrheumatic tricuspid valve disorder, unspecified: Secondary | ICD-10-CM | POA: Diagnosis not present

## 2012-05-26 DIAGNOSIS — I517 Cardiomegaly: Secondary | ICD-10-CM | POA: Insufficient documentation

## 2012-05-26 DIAGNOSIS — C3490 Malignant neoplasm of unspecified part of unspecified bronchus or lung: Secondary | ICD-10-CM | POA: Diagnosis not present

## 2012-05-26 NOTE — Progress Notes (Signed)
*  PRELIMINARY RESULTS* Echocardiogram 2D Echocardiogram has been performed.  Conrad [place] 05/26/2012, 1:41 PM

## 2012-06-04 ENCOUNTER — Encounter: Payer: Self-pay | Admitting: *Deleted

## 2012-06-04 ENCOUNTER — Other Ambulatory Visit: Payer: Self-pay | Admitting: *Deleted

## 2012-06-04 DIAGNOSIS — I10 Essential (primary) hypertension: Secondary | ICD-10-CM

## 2012-06-17 ENCOUNTER — Encounter: Payer: Self-pay | Admitting: Adult Health

## 2012-06-17 ENCOUNTER — Encounter: Payer: Self-pay | Admitting: *Deleted

## 2012-06-17 ENCOUNTER — Ambulatory Visit (INDEPENDENT_AMBULATORY_CARE_PROVIDER_SITE_OTHER): Payer: Managed Care, Other (non HMO) | Admitting: Adult Health

## 2012-06-17 VITALS — BP 138/84 | HR 88 | Wt 192.0 lb

## 2012-06-17 DIAGNOSIS — I10 Essential (primary) hypertension: Secondary | ICD-10-CM

## 2012-06-17 DIAGNOSIS — I272 Pulmonary hypertension, unspecified: Secondary | ICD-10-CM

## 2012-06-17 DIAGNOSIS — E785 Hyperlipidemia, unspecified: Secondary | ICD-10-CM | POA: Diagnosis not present

## 2012-06-17 DIAGNOSIS — J8410 Pulmonary fibrosis, unspecified: Secondary | ICD-10-CM

## 2012-06-17 DIAGNOSIS — I2789 Other specified pulmonary heart diseases: Secondary | ICD-10-CM | POA: Diagnosis not present

## 2012-06-17 LAB — LIPID PANEL
Cholesterol: 241 mg/dL — ABNORMAL HIGH (ref 0–200)
VLDL: 35 mg/dL (ref 0–40)

## 2012-06-17 MED ORDER — ATORVASTATIN CALCIUM 40 MG PO TABS: 40.0000 mg | ORAL_TABLET | Freq: Every day | ORAL | Status: DC

## 2012-06-17 NOTE — Assessment & Plan Note (Signed)
I have discussed the echocardiogram results with Dr. Diona Browner on site. The patient will be referred to pulmonology in [facility]. The patient states that in the past she been told that she had pulmonary fibrosis although I have no records of same. She will need to have a workup via pulmonology for better evaluation from their standpoint. More recommendations concerning medications and adjustments of current doses will be deferred to them on their assessment.

## 2012-06-17 NOTE — Patient Instructions (Addendum)
Your physician recommends that you schedule a follow-up appointment in: 6 months  Referral has been made to [facility] in [facility]  Your physician has recommended you make the following change in your medication:  1 - START Atorvastatin (Lipitor) 40 mg daily  Your physician recommends that you return for lab work in: 6 weeks - You will receive a letter

## 2012-06-17 NOTE — Progress Notes (Signed)
HPI: Kathleen Burns is a 56 year old patient of Dr. Trilby Bing we are following for ongoing assessment treatment of hypertension, history of renal insufficiency, ongoing tobacco abuse and hyperlipidemia. The patient has been seen recently for labile blood pressure. She lives with a partner who works Advertising copywriter and therefore she is awake all night and sleeping during the day. She was taking her blood pressure medications at different times during the day with changes in blood pressure throughout the day from very low to 90/46 too high at 158/90. On last visit I advised to take her medications at the same time every day. She is here on followup. She still states that she is having drops in her blood pressure on an off throughout the day. Usually her blood pressure goes up after smoking cigarettes, she states she smokes approximately 1-2 an hour. She is also taking morphine for chronic pain. And has noticed that her blood pressure will drop within a couple hours after taking that medication. I will also check an echocardiogram on her from the prior visit. Along with lipids and LFTs. She denies any chest discomfort, or dizziness.  Allergies  Allergen Reactions  . Artane (Trihexyphenidyl) Other (See Comments)    tachycardia  . Aspirin Other (See Comments)    + nonsteroidals-tachycardia  . Cogentin (Benztropine Mesylate) Other (See Comments)    Tachycardia   . Demerol Other (See Comments)    Delirium  . Inderal (Propranolol Hcl) Other (See Comments)    Psychosis  . Levofloxacin Other (See Comments)    Muscle weakness  . Paxil (Paroxetine Hcl) Other (See Comments)    Anxiety   . Procardia (Nifedipine)   . Sulfa Antibiotics   . Penicillins Rash    Current Outpatient Prescriptions  Medication Sig Dispense Refill  . acetaminophen (TYLENOL) 325 MG tablet Take 2 tablets (650 mg total) by mouth every 6 (six) hours as needed for pain.      Marland Kitchen albuterol (PROAIR HFA) 108 (90 BASE) MCG/ACT inhaler Inhale  2 puffs into the lungs 4 (four) times daily as needed. For shortness of breath       . amLODipine (NORVASC) 10 MG tablet Take 1 tablet (10 mg total) by mouth daily.  30 tablet  11  . celecoxib (CELEBREX) 100 MG capsule Take 100 mg by mouth 2 (two) times daily.      . cetirizine (ZYRTEC) 10 MG tablet Take 10 mg by mouth as needed.      Marland Kitchen HYDROcodone-acetaminophen (NORCO) 10-325 MG per tablet Take 1 tablet by mouth every 6 (six) hours as needed. For pain       . LORazepam (ATIVAN) 1 MG tablet Take 1 mg by mouth every 8 (eight) hours.        Marland Kitchen morphine (AVINZA) 120 MG 24 hr capsule Take 120 mg by mouth 2 (two) times daily.       Marland Kitchen omeprazole (PRILOSEC) 20 MG capsule Take 20 mg by mouth 2 (two) times daily.        . OnabotulinumtoxinA (BOTOX IJ) Inject 80 Units as directed every 6 (six) months.       Marland Kitchen UNABLE TO FIND Inject as directed every 3 (three) months. Xenomin      . zolpidem (AMBIEN CR) 12.5 MG CR tablet Take 12.5 mg by mouth at bedtime.        Marland Kitchen atorvastatin (LIPITOR) 40 MG tablet Take 1 tablet (40 mg total) by mouth daily.  30 tablet  6    Past Medical  History  Diagnosis Date  . COPD (chronic obstructive pulmonary disease)     with bronchospasm  . Chest pain     Cardiac catheterization in 1999, 2003, 2008-normal EF, normal coronaries  . GERD (gastroesophageal reflux disease)   . Hypertension   . Meige disease (lymphedema praecox)     blepharospasm; followed at [facility] And treated with botulinum toxin injections  . Osteoarthritis   . Breast cancer     Left mastectomy in 1990s + chemotherapy  . Hyperlipidemia   . Anxiety and depression     H/o suicide attempt  . Skull fracture 1969    surgical repair in 1969  . Hearing impairment   . Tobacco abuse     45 pack years  . Obesity     Past Surgical History  Procedure Date  . Mastectomy 1990    Left  . Back surgery 1998 and  . Eye surgery 1999, 2001  . Skull fracture elevation 1969  . Carpal tunnel release 1996  . Shoulder  surgery 1996    Left  . Knee surgery 1999    Left  . Cholecystectomy 1996  . Total abdominal hysterectomy w/ bilateral salpingoophorectomy 1994  . Tonsillectomy   . Colonoscopy 2011    No significant abnormalities    ZOX:WRUEAV of systems complete and found to be negative unless listed above  PHYSICAL EXAM BP 138/84  Pulse 88  Wt 192 lb (87.091 kg)  General: Well developed, well nourished, in no acute distress Head: Eyes PERRLA, No xanthomas.   Normal cephalic and atramatic  Lungs: Some crackles are noted in the bases, with expiratory wheezes. Heart: HRRR S1 S2, without MRG.  Pulses are 2+ & equal.            No carotid bruit. No JVD.  No abdominal bruits. No femoral bruits. Abdomen: Bowel sounds are positive, abdomen soft and non-tender without masses or                  Hernia's noted. Msk:  Back normal, normal gait. Normal strength and tone for age. Extremities: No clubbing, cyanosis or edema.  DP +1 Neuro: Alert and oriented X 3. Psych:  Good affect, responds appropriately     ASSESSMENT AND PLAN

## 2012-06-17 NOTE — Progress Notes (Deleted)
Name: Kathleen Burns    DOB: 1956/05/04  Age: 56 y.o.  MR#: [id number]       PCP:  Reynolds Bowl, MD      Insurance: @PAYORNAME @   CC:   No chief complaint on file.   VS BP 138/84  Pulse 88  Wt 192 lb (87.091 kg)  Weights Current Weight  06/17/12 192 lb (87.091 kg)  05/18/12 190 lb (86.183 kg)  04/12/12 190 lb 1.9 oz (86.238 kg)    Blood Pressure  BP Readings from Last 3 Encounters:  06/17/12 138/84  05/18/12 150/102  04/13/12 124/96     Admit date:  (Not on file) Last encounter with RMR:  05/18/2012   Allergy Allergies  Allergen Reactions  . Artane (Trihexyphenidyl) Other (See Comments)    tachycardia  . Aspirin Other (See Comments)    + nonsteroidals-tachycardia  . Cogentin (Benztropine Mesylate) Other (See Comments)    Tachycardia   . Demerol Other (See Comments)    Delirium  . Inderal (Propranolol Hcl) Other (See Comments)    Psychosis  . Levofloxacin Other (See Comments)    Muscle weakness  . Paxil (Paroxetine Hcl) Other (See Comments)    Anxiety   . Procardia (Nifedipine)   . Sulfa Antibiotics   . Penicillins Rash    Current Outpatient Prescriptions  Medication Sig Dispense Refill  . acetaminophen (TYLENOL) 325 MG tablet Take 2 tablets (650 mg total) by mouth every 6 (six) hours as needed for pain.      Marland Kitchen albuterol (PROAIR HFA) 108 (90 BASE) MCG/ACT inhaler Inhale 2 puffs into the lungs 4 (four) times daily as needed. For shortness of breath       . amLODipine (NORVASC) 10 MG tablet Take 1 tablet (10 mg total) by mouth daily.  30 tablet  11  . celecoxib (CELEBREX) 100 MG capsule Take 100 mg by mouth 2 (two) times daily.      . cetirizine (ZYRTEC) 10 MG tablet Take 10 mg by mouth as needed.      Marland Kitchen HYDROcodone-acetaminophen (NORCO) 10-325 MG per tablet Take 1 tablet by mouth every 6 (six) hours as needed. For pain       . LORazepam (ATIVAN) 1 MG tablet Take 1 mg by mouth every 8 (eight) hours.        Marland Kitchen morphine (AVINZA) 120 MG 24 hr capsule Take 120 mg  by mouth 2 (two) times daily.       Marland Kitchen omeprazole (PRILOSEC) 20 MG capsule Take 20 mg by mouth 2 (two) times daily.        . OnabotulinumtoxinA (BOTOX IJ) Inject 80 Units as directed every 6 (six) months.       Marland Kitchen UNABLE TO FIND Inject as directed every 3 (three) months. Xenomin      . zolpidem (AMBIEN CR) 12.5 MG CR tablet Take 12.5 mg by mouth at bedtime.          Discontinued Meds:   There are no discontinued medications.  Patient Active Problem List  Diagnosis  . Chest pain  . Hypertension  . Meige disease (lymphedema praecox)  . Breast cancer  . Hyperlipidemia  . Anxiety and depression  . Tobacco abuse  . Chronic kidney disease  . Obesity  . Hyperkalemia    LABS Orders Only on 06/04/2012  Component Date Value  . Cholesterol 06/16/2012 241*  . Triglycerides 06/16/2012 176*  . HDL 06/16/2012 57   . Total CHOL/HDL Ratio 06/16/2012 4.2   . VLDL 06/16/2012 35   .  LDL Cholesterol 06/16/2012 149*  Orders Only on 04/02/2012  Component Date Value  . Sodium 04/02/2012 137   . Potassium 04/02/2012 5.1   . Chloride 04/02/2012 98   . CO2 04/02/2012 28   . Glucose, Bld 04/02/2012 90   . BUN 04/02/2012 22   . Creat 04/02/2012 1.09   . Calcium 04/02/2012 10.3   . Cholesterol 04/12/2012 215*  . Triglycerides 04/12/2012 236*  . HDL 04/12/2012 46   . Total CHOL/HDL Ratio 04/12/2012 4.7   . VLDL 04/12/2012 47*  . LDL Cholesterol 04/12/2012 122*     Results for this Opt Visit:     Results for orders placed in visit on 06/04/12  LIPID PANEL      Component Value Range   Cholesterol 241 (*) 0 - 200 mg/dL   Triglycerides 213 (*) <150 mg/dL   HDL 57  >08 mg/dL   Total CHOL/HDL Ratio 4.2     VLDL 35  0 - 40 mg/dL   LDL Cholesterol 657 (*) 0 - 99 mg/dL    EKG Orders placed in visit on 02/13/12  . EKG     Prior Assessment and Plan Problem List as of 06/17/2012            Cardiology Problems   Hypertension   Last Assessment & Plan Note   05/18/2012 Office Visit Signed  05/18/2012  4:00 PM by Jodelle Gross, NP    Review of blood pressure log to include orthostatics reveals a blood pressure range of 136 over 92 to 104/70. Unfortunately she is orthostatics with a decrease of her blood pressure from the higher level lying down to the lower level with standing. However, at other times during the day her blood pressure remains normal with normal response to standing. She does not take her blood pressure medication the same time every day. And is often DZ with position changes. Average blood pressures in the 120 range systolically but I am seeing levels of 93/64 102/75.   Today she is elevated on her blood pressure in the 150s range. I am not going to make any changes at this time and asked her to begin her medication at the same time every day. She is with a partner who works nights, and therefore she is up all night and sleeps during the day as well. I have advised her to take her blood pressure medication within an hour after waking. She states she usually awakens around 12:00 in the afternoon. I have asked her to take it around 1 PM.  Review of results of renal artery ultrasound was negative for renal artery stenosis dated 02/06/2012. She had normal caliber abdominal aorta, asymmetrical kidney size right smaller by 2.1 cm in length. She had normal renal arteries bilaterally.  It does not appear that she has had an echocardiogram in the past. Will evaluate her LV function with an echocardiogram prior to her next visit.    Hyperlipidemia   Last Assessment & Plan Note   05/18/2012 Office Visit Signed 05/18/2012  4:01 PM by Jodelle Gross, NP    Review of labs shows a cholesterol of 215, triglycerides of 236, HDL of 46, LDL 122. Unfortunately the patient ate prior to having her labs completed. I will not add medications at this time and repeat her class all status fasting in one month. She is given instructions on a low cholesterol diet.      Other   Chest pain    Last Assessment &  Plan Note   03/05/2012 Office Visit Signed 03/12/2012  8:23 AM by Kathlen Brunswick, MD    No recent chest discomfort.  With negative coronary angiography 5 years ago, the likelihood that she currently has hemodynamically significant coronary disease is minimal.    Meige disease (lymphedema praecox)   Breast cancer   Anxiety and depression   Tobacco abuse   Last Assessment & Plan Note   05/18/2012 Office Visit Signed 05/18/2012  4:01 PM by Jodelle Gross, NP    I was spoken to her concerning smoking cessation. I have provided her with information on smoking cessation classes N/A quit line is available through [facility]. She verbalizes understanding.    Chronic kidney disease   Last Assessment & Plan Note   03/05/2012 Office Visit Signed 03/05/2012  3:44 PM by Kathlen Brunswick, MD    Renal function has normalized with adjustment of antihypertensive medications.  Asymptomatic urinary tract infection will be treated with a 3 day course of antibiotics.  Organism was Escherichia coli and was pansensitive.    Obesity   Hyperkalemia   Last Assessment & Plan Note   03/05/2012 Office Visit Signed 03/12/2012  8:25 AM by Kathlen Brunswick, MD    Potassium restriction will be added to her diet and chemistry profile reassessed in one month.        Imaging: No results found.   FRS Calculation: Score not calculated. Missing: Total Cholesterol

## 2012-06-17 NOTE — Assessment & Plan Note (Signed)
Total cholesterol was 241, HDL 57, triglycerides 176, LDL 149. Will place her on Lipitor 40 mg 1 by mouth at at bedtime. Left followup lipids and LFTs in 6 weeks. She has been advised a low-cholesterol diet. With comparison to prior cholesterol studies completed in October 2013. She has had a worsening of her total cholesterol and LDL

## 2012-06-17 NOTE — Assessment & Plan Note (Signed)
Blood pressure is labile, but I believe this to be multifactorial. Blood pressure often rises after she smokes cigarettes, she smokes 1-2 and hour. She is also on chronic morphine for chronic pain. She states, that her  blood pressure goes down after she takes one of the pills within the next 2 hours. I have advised her of the contributing factors, and also that the blood pressure can range up and down throughout the day. I will not make any changes currently in her blood pressure medication regimen.

## 2012-06-24 ENCOUNTER — Encounter: Payer: Self-pay | Admitting: Adult Health

## 2012-07-14 ENCOUNTER — Telehealth: Payer: Self-pay | Admitting: *Deleted

## 2012-07-14 ENCOUNTER — Encounter: Payer: Self-pay | Admitting: Adult Health

## 2012-07-14 NOTE — Telephone Encounter (Signed)
Pt contacted office via My Chart, this nurse made call and reply via telephone and my chart to advise pt about next steps, notations copied below:  From Ovidio Kin, LPN [[id number]] To Daziya Redmond Composed 07/14/2012 1:07 PM For Delivery On 07/14/2012 1:07 PM Subject RE: Non-Urgent Medical Question Message Type Patient Medical Advice Request Read Status N By Daneil Dolin has not read the message Message Body I called your home to speak with you and unfortunately you were a sleep, I did speak with [name] concerning your sxs and she noted that you are no longer experiencing chest pain at this time and the last episode was noted as last week with a run time of 15 minutes, offered an apt with MD Dietrich Pates and she advised you would rather see Samara Deist, an apt has been scheduled for you to see Samara Deist this Friday 07-16-12 at 3pm, please keep this apt and in the future please go to the Emergency Room to be evaluated, however do not drive yourself, call EMS first to transport you, please contact our office with any further concerns that you may have.   ----- Message -----  From: Daneil Dolin  Sent: 07/14/2012 12:44 PM EST  To: Joni Reining, NP  Subject: Non-Urgent Medical Question   Iwas seen in December and was told that I needed to see a pulmonary Doctor and we decided to go with the office in [facility] and was told they would call and set up an appointment. I have not heard anything from them. So instead of going to [facility] please schedule the appointment with the pulmonary Doctor in [place]. I believe it need to be as soon as possiible because I have had two espiodes of severe chest pain with my blood pressure being 180/150 one time and around that the second time. The chest pain is severe with the pain shooting up into my neck and jaw   Called [facility] to clarify the apt date and time, Jeanice Lim offered pt an apt for tomorrow 07-15-12 AT 2PM, pt declined apt and wanted to request and  apt for next week in the afternoon, noted ok with MD Clance 3pm on 07-28-12 at [address], second floor, located across from [facility] hospital, pt understood and will keep apt

## 2012-07-14 NOTE — Telephone Encounter (Signed)
FYI

## 2012-07-16 ENCOUNTER — Encounter: Payer: Self-pay | Admitting: Adult Health

## 2012-07-16 ENCOUNTER — Ambulatory Visit (INDEPENDENT_AMBULATORY_CARE_PROVIDER_SITE_OTHER): Payer: Medicare Other | Admitting: Adult Health

## 2012-07-16 ENCOUNTER — Institutional Professional Consult (permissible substitution): Payer: PRIVATE HEALTH INSURANCE | Admitting: Pulmonary Disease

## 2012-07-16 VITALS — BP 117/75 | HR 105 | Ht 66.0 in | Wt 193.1 lb

## 2012-07-16 DIAGNOSIS — I272 Pulmonary hypertension, unspecified: Secondary | ICD-10-CM

## 2012-07-16 DIAGNOSIS — I2789 Other specified pulmonary heart diseases: Secondary | ICD-10-CM

## 2012-07-16 DIAGNOSIS — R079 Chest pain, unspecified: Secondary | ICD-10-CM

## 2012-07-16 DIAGNOSIS — I10 Essential (primary) hypertension: Secondary | ICD-10-CM

## 2012-07-16 MED ORDER — NITROGLYCERIN 0.4 MG SL SUBL: 0.4000 mg | SUBLINGUAL_TABLET | SUBLINGUAL | Status: DC | PRN

## 2012-07-16 NOTE — Assessment & Plan Note (Signed)
Good control at present. She has self reported hypertension with chest pain in the recent past, that normalized once pain was relieved. No changes in her medications at this time. Would not add BB in the setting of pulmonary disease.

## 2012-07-16 NOTE — Assessment & Plan Note (Signed)
She is encouraged to see pulmonologist at scheduled appointment and to stop smoking.

## 2012-07-16 NOTE — Patient Instructions (Addendum)
Your physician recommends that you schedule a follow-up appointment in: 3 MONTHS WITH KL  KEEP YOUR UPCOMING PULMONARY APT ON 07-28-12

## 2012-07-16 NOTE — Assessment & Plan Note (Signed)
Doubt cardiac etiology of chest discomfort. May be broncho spams, or esophageal spams, which she has experienced in the past. She states that she was told she had coronary spams and had been placed on nitrates but stopped taking them. I have asked her to quit smoking and drinking so much caffeine to help with over stimulation which could possibly cause her symptoms as well. I have provided her a Rx for NTG 0.4 mg to use prn. Last cath 5 years ago is reassuring and will not pursue more testing at this time until pulmonary evaluation is complete. Anxiety can also be playing a role in her chest pain as it is relieved with use of ativan.

## 2012-07-16 NOTE — Progress Notes (Signed)
HPI: Kathleen Burns is an anxious 57 y/o patient of Dr. Dietrich Pates we are following for ongoing assessment of hypertension, hyperlipidemia, with history of recurrent chest pain with negative coronary angiography 5 years ago and recent diagnosis of pulmonary hypertension. She is due to see Dr. Shelle Iron on 07/28/12 for evaluation and treatment options. She unfortunately continues to smoke heavily, 2-3 an hour. She comes today after complaints of recurrent chest pain with rapid HR last "a few minutes" at rest going away on its own with Ativan and rest. No associated diaphoresis, increased dyspnea or weakness. This occurred after being up all night waiting on girlfriend to come home from work, drinking tea and diet Pepsi. She has had two episodes of this, once on Christmas Eve and once on January 11th. No precipitating factors that she can think of. Otherwise she has been in her usual state of health.   Allergies  Allergen Reactions  . Artane (Trihexyphenidyl) Other (See Comments)    tachycardia  . Aspirin Other (See Comments)    + nonsteroidals-tachycardia  . Cogentin (Benztropine Mesylate) Other (See Comments)    Tachycardia   . Demerol Other (See Comments)    Delirium  . Inderal (Propranolol Hcl) Other (See Comments)    Psychosis  . Levofloxacin Other (See Comments)    Muscle weakness  . Paxil (Paroxetine Hcl) Other (See Comments)    Anxiety   . Procardia (Nifedipine)   . Sulfa Antibiotics   . Penicillins Rash    Current Outpatient Prescriptions  Medication Sig Dispense Refill  . acetaminophen (TYLENOL) 325 MG tablet Take 2 tablets (650 mg total) by mouth every 6 (six) hours as needed for pain.      Marland Kitchen albuterol (PROAIR HFA) 108 (90 BASE) MCG/ACT inhaler Inhale 2 puffs into the lungs 4 (four) times daily as needed. For shortness of breath       . amLODipine (NORVASC) 10 MG tablet Take 1 tablet (10 mg total) by mouth daily.  30 tablet  11  . atorvastatin (LIPITOR) 40 MG tablet Take 1 tablet  (40 mg total) by mouth daily.  30 tablet  6  . celecoxib (CELEBREX) 100 MG capsule Take 100 mg by mouth 2 (two) times daily.      . cetirizine (ZYRTEC) 10 MG tablet Take 10 mg by mouth as needed.      Marland Kitchen HYDROcodone-acetaminophen (NORCO) 10-325 MG per tablet Take 1 tablet by mouth every 6 (six) hours as needed. For pain       . LORazepam (ATIVAN) 1 MG tablet Take 1 mg by mouth every 8 (eight) hours.        Marland Kitchen morphine (AVINZA) 120 MG 24 hr capsule Take 120 mg by mouth 2 (two) times daily.       Marland Kitchen omeprazole (PRILOSEC) 20 MG capsule Take 20 mg by mouth 2 (two) times daily.        . OnabotulinumtoxinA (BOTOX IJ) Inject 80 Units as directed every 6 (six) months.       Marland Kitchen UNABLE TO FIND Inject as directed every 3 (three) months. Xenomin      . zolpidem (AMBIEN CR) 12.5 MG CR tablet Take 12.5 mg by mouth at bedtime.        . nitroGLYCERIN (NITROSTAT) 0.4 MG SL tablet Place 1 tablet (0.4 mg total) under the tongue every 5 (five) minutes as needed for chest pain.  25 tablet  3    Past Medical History  Diagnosis Date  . COPD (chronic obstructive pulmonary  disease)     with bronchospasm  . Chest pain     Cardiac catheterization in 1999, 2003, 2008-normal EF, normal coronaries  . GERD (gastroesophageal reflux disease)   . Hypertension   . Meige disease (lymphedema praecox)     blepharospasm; followed at [facility] And treated with botulinum toxin injections  . Osteoarthritis   . Breast cancer     Left mastectomy in 1990s + chemotherapy  . Hyperlipidemia   . Anxiety and depression     H/o suicide attempt  . Skull fracture 1969    surgical repair in 1969  . Hearing impairment   . Tobacco abuse     45 pack years  . Obesity     Past Surgical History  Procedure Date  . Mastectomy 1990    Left  . Back surgery 1998 and  . Eye surgery 1999, 2001  . Skull fracture elevation 1969  . Carpal tunnel release 1996  . Shoulder surgery 1996    Left  . Knee surgery 1999    Left  . Cholecystectomy 1996    . Total abdominal hysterectomy w/ bilateral salpingoophorectomy 1994  . Tonsillectomy   . Colonoscopy 2011    No significant abnormalities    ZOX:WRUEAV of systems complete and found to be negative unless listed above  PHYSICAL EXAM BP 117/75  Pulse 105  Ht 5\' 6"  (1.676 m)  Wt 193 lb 1.3 oz (87.581 kg)  BMI 31.16 kg/m2  SpO2 93%  General: Well developed, well nourished, in no acute distress Head: Eyes PERRLA, No xanthomas.   Normal cephalic and atramatic Endentulous Lungs: Bilateral rales and rhonchi with occasional wheezes Heart: HRRR S1 S2, without MRG.  Pulses are 2+ & equal.            No carotid bruit. No JVD.  No abdominal bruits. No femoral bruits. Abdomen: Bowel sounds are positive, abdomen soft and non-tender without masses or                  Hernia's noted. Msk:  Back normal, normal gait. Normal strength and tone for age. Extremities: No clubbing, cyanosis or edema.  DP +1 Neuro: Alert and oriented X 3. Psych:  Good affect, responds appropriately    ASSESSMENT AND PLAN

## 2012-07-16 NOTE — Progress Notes (Deleted)
Name: Kathleen Burns    DOB: [birth year]  Age: 57 y.o.  MR#: [id number]       PCP:  Reynolds Bowl, MD      Insurance: @PAYORNAME @   CC:    Chief Complaint  Patient presents with  . Office Visit    Chest pain    VS BP 117/75  Pulse 105  Ht 5\' 6"  (1.676 m)  Wt 193 lb 1.3 oz (87.581 kg)  BMI 31.16 kg/m2  SpO2 93%  Weights Current Weight  07/16/12 193 lb 1.3 oz (87.581 kg)  06/17/12 192 lb (87.091 kg)  05/18/12 190 lb (86.183 kg)    Blood Pressure  BP Readings from Last 3 Encounters:  07/16/12 117/75  06/17/12 138/84  05/18/12 150/102     Admit date:  (Not on file) Last encounter with RMR:  07/14/2012   Allergy Allergies  Allergen Reactions  . Artane (Trihexyphenidyl) Other (See Comments)    tachycardia  . Aspirin Other (See Comments)    + nonsteroidals-tachycardia  . Cogentin (Benztropine Mesylate) Other (See Comments)    Tachycardia   . Demerol Other (See Comments)    Delirium  . Inderal (Propranolol Hcl) Other (See Comments)    Psychosis  . Levofloxacin Other (See Comments)    Muscle weakness  . Paxil (Paroxetine Hcl) Other (See Comments)    Anxiety   . Procardia (Nifedipine)   . Sulfa Antibiotics   . Penicillins Rash    Current Outpatient Prescriptions  Medication Sig Dispense Refill  . acetaminophen (TYLENOL) 325 MG tablet Take 2 tablets (650 mg total) by mouth every 6 (six) hours as needed for pain.      Marland Kitchen albuterol (PROAIR HFA) 108 (90 BASE) MCG/ACT inhaler Inhale 2 puffs into the lungs 4 (four) times daily as needed. For shortness of breath       . amLODipine (NORVASC) 10 MG tablet Take 1 tablet (10 mg total) by mouth daily.  30 tablet  11  . atorvastatin (LIPITOR) 40 MG tablet Take 1 tablet (40 mg total) by mouth daily.  30 tablet  6  . celecoxib (CELEBREX) 100 MG capsule Take 100 mg by mouth 2 (two) times daily.      . cetirizine (ZYRTEC) 10 MG tablet Take 10 mg by mouth as needed.      Marland Kitchen HYDROcodone-acetaminophen (NORCO) 10-325 MG per tablet Take  1 tablet by mouth every 6 (six) hours as needed. For pain       . LORazepam (ATIVAN) 1 MG tablet Take 1 mg by mouth every 8 (eight) hours.        Marland Kitchen morphine (AVINZA) 120 MG 24 hr capsule Take 120 mg by mouth 2 (two) times daily.       Marland Kitchen omeprazole (PRILOSEC) 20 MG capsule Take 20 mg by mouth 2 (two) times daily.        . OnabotulinumtoxinA (BOTOX IJ) Inject 80 Units as directed every 6 (six) months.       Marland Kitchen UNABLE TO FIND Inject as directed every 3 (three) months. Xenomin      . zolpidem (AMBIEN CR) 12.5 MG CR tablet Take 12.5 mg by mouth at bedtime.          Discontinued Meds:   There are no discontinued medications.  Patient Active Problem List  Diagnosis  . Chest pain  . Hypertension  . Meige disease (lymphedema praecox)  . Breast cancer  . Hyperlipidemia  . Anxiety and depression  . Tobacco abuse  . Chronic kidney  disease  . Obesity  . Hyperkalemia  . Pulmonary hypertension    LABS Orders Only on 06/04/2012  Component Date Value  . Cholesterol 06/16/2012 241*  . Triglycerides 06/16/2012 176*  . HDL 06/16/2012 57   . Total CHOL/HDL Ratio 06/16/2012 4.2   . VLDL 06/16/2012 35   . LDL Cholesterol 06/16/2012 149*     Results for this Opt Visit:     Results for orders placed in visit on 06/04/12  LIPID PANEL      Component Value Range   Cholesterol 241 (*) 0 - 200 mg/dL   Triglycerides 962 (*) <150 mg/dL   HDL 57  >95 mg/dL   Total CHOL/HDL Ratio 4.2     VLDL 35  0 - 40 mg/dL   LDL Cholesterol 284 (*) 0 - 99 mg/dL    EKG Orders placed in visit on 07/16/12  . EKG 12-LEAD     Prior Assessment and Plan Problem List as of 07/16/2012          Chest pain   Last Assessment & Plan Note   03/05/2012 Office Visit Signed 03/12/2012  8:23 AM by Kathlen Brunswick, MD    No recent chest discomfort.  With negative coronary angiography 5 years ago, the likelihood that she currently has hemodynamically significant coronary disease is minimal.    Hypertension   Last  Assessment & Plan Note   06/17/2012 Office Visit Signed 06/17/2012  4:49 PM by Jodelle Gross, NP    Blood pressure is labile, but I believe this to be multifactorial. Blood pressure often rises after she smokes cigarettes, she smokes 1-2 and hour. She is also on chronic morphine for chronic pain. She states, that her  blood pressure goes down after she takes one of the pills within the next 2 hours. I have advised her of the contributing factors, and also that the blood pressure can range up and down throughout the day. I will not make any changes currently in her blood pressure medication regimen.    Meige disease (lymphedema praecox)   Breast cancer   Hyperlipidemia   Last Assessment & Plan Note   06/17/2012 Office Visit Signed 06/17/2012  4:50 PM by Jodelle Gross, NP    Total cholesterol was 241, HDL 57, triglycerides 176, LDL 149. Will place her on Lipitor 40 mg 1 by mouth at at bedtime. Left followup lipids and LFTs in 6 weeks. She has been advised a low-cholesterol diet. With comparison to prior cholesterol studies completed in October 2013. She has had a worsening of her total cholesterol and LDL    Anxiety and depression   Tobacco abuse   Last Assessment & Plan Note   05/18/2012 Office Visit Signed 05/18/2012  4:01 PM by Jodelle Gross, NP    I was spoken to her concerning smoking cessation. I have provided her with information on smoking cessation classes N/A quit line is available through [facility]. She verbalizes understanding.    Chronic kidney disease   Last Assessment & Plan Note   03/05/2012 Office Visit Signed 03/05/2012  3:44 PM by Kathlen Brunswick, MD    Renal function has normalized with adjustment of antihypertensive medications.  Asymptomatic urinary tract infection will be treated with a 3 day course of antibiotics.  Organism was Escherichia coli and was pansensitive.    Obesity   Hyperkalemia   Last Assessment & Plan Note   03/05/2012 Office Visit Signed  03/12/2012  8:25 AM by Gerrit Friends  Rothbart, MD    Potassium restriction will be added to her diet and chemistry profile reassessed in one month.    Pulmonary hypertension   Last Assessment & Plan Note   06/17/2012 Office Visit Signed 06/17/2012  4:53 PM by Jodelle Gross, NP    I have discussed the echocardiogram results with Dr. Diona Browner on site. The patient will be referred to pulmonology in [facility]. The patient states that in the past she been told that she had pulmonary fibrosis although I have no records of same. She will need to have a workup via pulmonology for better evaluation from their standpoint. More recommendations concerning medications and adjustments of current doses will be deferred to them on their assessment.        Imaging: No results found.   FRS Calculation: Score not calculated. Missing: Total Cholesterol

## 2012-07-28 ENCOUNTER — Institutional Professional Consult (permissible substitution): Payer: PRIVATE HEALTH INSURANCE | Admitting: Pulmonary Disease

## 2012-08-11 ENCOUNTER — Institutional Professional Consult (permissible substitution): Payer: Self-pay | Admitting: Pulmonary Disease

## 2012-08-14 ENCOUNTER — Other Ambulatory Visit: Payer: Self-pay

## 2012-08-31 ENCOUNTER — Telehealth: Payer: Self-pay | Admitting: Pulmonary Disease

## 2012-08-31 ENCOUNTER — Ambulatory Visit (INDEPENDENT_AMBULATORY_CARE_PROVIDER_SITE_OTHER)
Admission: RE | Admit: 2012-08-31 | Discharge: 2012-08-31 | Disposition: A | Discharge status: Home / Self Care | Payer: Managed Care, Other (non HMO) | Source: Ambulatory Visit | Attending: Pulmonary Disease | Admitting: Pulmonary Disease

## 2012-08-31 ENCOUNTER — Ambulatory Visit (INDEPENDENT_AMBULATORY_CARE_PROVIDER_SITE_OTHER): Payer: Managed Care, Other (non HMO) | Admitting: Pulmonary Disease

## 2012-08-31 ENCOUNTER — Encounter: Payer: Self-pay | Admitting: Pulmonary Disease

## 2012-08-31 VITALS — BP 142/100 | HR 111 | Temp 97.9°F | Ht 66.0 in | Wt 197.5 lb

## 2012-08-31 DIAGNOSIS — I272 Pulmonary hypertension, unspecified: Secondary | ICD-10-CM

## 2012-08-31 DIAGNOSIS — R0989 Other specified symptoms and signs involving the circulatory and respiratory systems: Secondary | ICD-10-CM

## 2012-08-31 DIAGNOSIS — I2789 Other specified pulmonary heart diseases: Secondary | ICD-10-CM

## 2012-08-31 DIAGNOSIS — R0609 Other forms of dyspnea: Secondary | ICD-10-CM

## 2012-08-31 NOTE — Assessment & Plan Note (Signed)
The patient has significant dyspnea on exertion that I suspect is multifactorial.  It is very likely that she has some element of COPD, and also has echocardiographic evidence for diastolic dysfunction.  She also has her elevated pulmonary artery pressures, and likely has significant deconditioning.  I would like to check a chest x-ray today, and also scheduled for pulmonary function studies.

## 2012-08-31 NOTE — Progress Notes (Signed)
  Subjective:    Patient ID: Kathleen Burns, female    DOB: 11/06/1955, 57 y.o.   MRN: [id number]  HPI The patient is a 57 year old female who I've been asked to see for pulmonary hypertension.  The patient has had recent episodes with chest pain and elevated blood pressures, and underwent an echocardiogram for evaluation in November of last year.  This showed a normal ejection fraction, diastolic dysfunction was present, and she was felt to have an elevated left ventricular end-diastolic filling pressure.  Also had a dilated left atrium with a mild decrease in right ventricular function.  Her pulmonary artery pressure was estimated at a peak of 64 mm.  The patient describes significant dyspnea on exertion at one block, and will get winded bringing groceries in from the car or doing light housework.  She has a long history of tobacco abuse, and although has been told that she has COPD she has not had a recent breathing test.  She uses albuterol as needed currently.  The patient has significant long-standing blood pressure issues, and is hypertensive today.  She has never been on anorexics, and denies any significant history to suggest sleep disordered breathing.  She has no history of autoimmune disease, but tells me that her testing has been negative in the past.  There is no family history for autoimmune disease.  She has no history of thromboembolic disease, and denies chronic lower extremity edema.  The patient did receive chemotherapy when she had breast cancer, and was told that it may have "damaged her lungs".   Review of Systems  Constitutional: Negative for fever and unexpected weight change.  HENT: Negative for ear pain, nosebleeds, congestion, sore throat, rhinorrhea, sneezing, trouble swallowing, dental problem, postnasal drip and sinus pressure.   Eyes: Negative for redness and itching.  Respiratory: Positive for cough, chest tightness and shortness of breath. Negative for wheezing.    Cardiovascular: Positive for chest pain and palpitations. Negative for leg swelling.  Gastrointestinal: Negative for nausea and vomiting.       Acid heart burn  Genitourinary: Negative for dysuria.  Musculoskeletal: Negative for joint swelling.  Skin: Negative for rash.  Neurological: Negative for headaches.  Hematological: Does not bruise/bleed easily.  Psychiatric/Behavioral: Negative for dysphoric mood. The patient is not nervous/anxious.        Objective:   Physical Exam Constitutional:  Overweight female, no acute distress  HENT:  Nares patent without discharge  Oropharynx without exudate, palate and uvula are normal.  Terrible dentition.   Eyes:  Perrla, eomi, no scleral icterus  Neck:  No JVD, no TMG  Cardiovascular:  Normal rate, regular rhythm, no rubs or gallops.  1/6 sem        Intact distal pulses  Pulmonary :  decreased breath sounds, no stridor or respiratory distress   No rales, rhonchi, or wheezing  Abdominal:  Soft, nondistended, bowel sounds present.  No tenderness noted.   Musculoskeletal:  No lower extremity edema noted, but puffy ankles.   Lymph Nodes:  No cervical lymphadenopathy noted  Skin:  No cyanosis noted  Neurologic:  Alert, appropriate, moves all 4 extremities without obvious deficit.         Assessment & Plan:

## 2012-08-31 NOTE — Patient Instructions (Addendum)
Will schedule you for breathing studies at [facility] Will check chest xray today, and call you with results. Need to arrange an appointment with Dr. Gala Romney to do a right heart catheterization to evaluate your pulmonary hypertension. Will see you back once your heart pressures are evaluated.

## 2012-08-31 NOTE — Assessment & Plan Note (Signed)
The patient has evidence for pulmonary hypertension by echocardiogram, however it is unclear whether this is pre-capillary or post capillary.  It certainly could be both.  She has diastolic dysfunction and poorly controlled blood pressure at this time, but also has a long history of smoking and may have COPD.  She does not have a history to support the other causes of pulmonary hypertension, but these will need to be investigated if she indeed does have elevated pressures at right heart catheterization.  I will refer her to cardiology for RHC, and we'll also check a chest x-ray and pulmonary function studies.

## 2012-08-31 NOTE — Telephone Encounter (Signed)
Please let pt know that her cxr shows changes of copd, "dirty lungs", and a large hiatal hernia (part of stomach up in chest).

## 2012-09-01 NOTE — Telephone Encounter (Signed)
Patient aware of results per [facility]. Pt states that she is trying really hard to quit smoking. She states that she is going to find way. Pt aware that once heart cath is complete and Dr Shelle Iron receives notes from [facility] we will schedule a f/u.

## 2012-09-17 ENCOUNTER — Ambulatory Visit (HOSPITAL_COMMUNITY)
Admission: RE | Admit: 2012-09-17 | Discharge: 2012-09-17 | Disposition: A | Discharge status: Home / Self Care | Payer: Managed Care, Other (non HMO) | Source: Ambulatory Visit | Attending: Pulmonary Disease | Admitting: Pulmonary Disease

## 2012-09-17 DIAGNOSIS — R0609 Other forms of dyspnea: Secondary | ICD-10-CM | POA: Insufficient documentation

## 2012-09-17 DIAGNOSIS — R0989 Other specified symptoms and signs involving the circulatory and respiratory systems: Secondary | ICD-10-CM | POA: Insufficient documentation

## 2012-09-17 MED ORDER — ALBUTEROL SULFATE (5 MG/ML) 0.5% IN NEBU
2.5000 mg | INHALATION_SOLUTION | Freq: Once | RESPIRATORY_TRACT | Status: AC
  Administered 2012-09-17: 2.5 mg via RESPIRATORY_TRACT

## 2012-09-23 ENCOUNTER — Ambulatory Visit (HOSPITAL_COMMUNITY)
Admission: RE | Admit: 2012-09-23 | Discharge: 2012-09-23 | Disposition: A | Discharge status: Home / Self Care | Payer: Managed Care, Other (non HMO) | Source: Ambulatory Visit | Attending: Internal Medicine | Admitting: Internal Medicine

## 2012-09-23 VITALS — BP 144/90 | Wt 196.1 lb

## 2012-09-23 DIAGNOSIS — R0609 Other forms of dyspnea: Secondary | ICD-10-CM | POA: Diagnosis not present

## 2012-09-23 DIAGNOSIS — E669 Obesity, unspecified: Secondary | ICD-10-CM | POA: Diagnosis not present

## 2012-09-23 DIAGNOSIS — I2789 Other specified pulmonary heart diseases: Secondary | ICD-10-CM

## 2012-09-23 DIAGNOSIS — J4489 Other specified chronic obstructive pulmonary disease: Secondary | ICD-10-CM | POA: Insufficient documentation

## 2012-09-23 DIAGNOSIS — F17200 Nicotine dependence, unspecified, uncomplicated: Secondary | ICD-10-CM | POA: Insufficient documentation

## 2012-09-23 DIAGNOSIS — I5032 Chronic diastolic (congestive) heart failure: Secondary | ICD-10-CM

## 2012-09-23 DIAGNOSIS — J449 Chronic obstructive pulmonary disease, unspecified: Secondary | ICD-10-CM | POA: Insufficient documentation

## 2012-09-23 DIAGNOSIS — I10 Essential (primary) hypertension: Secondary | ICD-10-CM | POA: Diagnosis not present

## 2012-09-23 DIAGNOSIS — I272 Pulmonary hypertension, unspecified: Secondary | ICD-10-CM

## 2012-09-23 DIAGNOSIS — R0602 Shortness of breath: Secondary | ICD-10-CM | POA: Insufficient documentation

## 2012-09-23 DIAGNOSIS — R079 Chest pain, unspecified: Secondary | ICD-10-CM | POA: Insufficient documentation

## 2012-09-23 DIAGNOSIS — K219 Gastro-esophageal reflux disease without esophagitis: Secondary | ICD-10-CM | POA: Diagnosis not present

## 2012-09-23 DIAGNOSIS — R0989 Other specified symptoms and signs involving the circulatory and respiratory systems: Secondary | ICD-10-CM | POA: Insufficient documentation

## 2012-09-23 NOTE — Patient Instructions (Addendum)
Will review PFTs for possible RHC

## 2012-09-23 NOTE — Progress Notes (Signed)
Referring Physician: Dr. Shelle Iron Primary Care:  Primary Cardiologist: Dr. Dietrich Pates Pulmonologist: Dr. Shelle Iron    HPI: Kathleen Burns is a 57 y.o. caucasian female history of HTN, HL, tobacco history for the last 30 years with 2 ppd.  Cath in 2008 showed normal coronary arteries.    She was referred to pulmonary for dyspnea concerning for pulmonary hypertension.  Dr Shelle Iron evaluated the patient on 3/4.  PFTs have been completed but results are not available.  CXR 08/31/12 showed very large hiatal hernia, COPD/chronic lung changes with no acute abnormalities.  He has referred her for possible right heart catheterization.  05/26/13: Echo EF 60-65%, Grade 1 diastolic dysfunction.  Ventricular septum with diastolic flattening.  Trivial MR.  Mildly dilated LA.  RV systolic function mildly reduced  Trivial TR.  PAPP 64 mmHg.    She is here with her partner, Kathleen Burns.  She feels ok today.  She notes that her breathing has been an issue over the last year.  She has tried to stop smoking but has been unsuccessful.  She gets shortness of breath with ambulating around her trailer.  She does not have oxygen.  She denies chest pain.  Did have episode in Jan but none since that time.  Ambulated in clinic and O2 sats dropped to 90% but tended to stay more around 93-94%.     Review of Systems: [y] = yes, [ ]  = no   General: Weight gain [ ] ; Weight loss [ ] ; Anorexia [ ] ; Fatigue [ ] ; Fever [ ] ; Chills [ ] ; Weakness [ ]   Cardiac: Chest pain/pressure [ ] ; Resting SOB [ ] ; Exertional SOB [place] ]; Orthopnea [ ] ; Pedal Edema [ ] ; Palpitations [ ] ; Syncope [ ] ; Presyncope [ ] ; Paroxysmal nocturnal dyspnea[ ]   Pulmonary: Cough [ ] ; Wheezing[ ] ; Hemoptysis[ ] ; Sputum [ ] ; Snoring [ ]   GI: Vomiting[ ] ; Dysphagia[ ] ; Melena[ ] ; Hematochezia [ ] ; Heartburn[ ] ; Abdominal pain [ ] ; Constipation [ ] ; Diarrhea [ ] ; BRBPR [ ]   GU: Hematuria[ ] ; Dysuria [ ] ; Nocturia[ ]   Vascular: Pain in legs with walking [ ] ; Pain in feet with lying  flat [ ] ; Non-healing sores [ ] ; Stroke [ ] ; TIA [ ] ; Slurred speech [ ] ;  Neuro: Headaches[ ] ; Vertigo[ ] ; Seizures[ ] ; Paresthesias[ ] ;Blurred vision [ ] ; Diplopia [ ] ; Vision changes [ ]   Ortho/Skin: Arthritis [ ] ; Joint pain [ ] ; Muscle pain [ ] ; Joint swelling [ ] ; Back Pain [ ] ; Rash [ ]   Psych: Depression[ ] ; Anxiety[ ]   Heme: Bleeding problems [ ] ; Clotting disorders [ ] ; Anemia [ ]   Endocrine: Diabetes [ ] ; Thyroid dysfunction[ ]    Past Medical History  Diagnosis Date  . COPD (chronic obstructive pulmonary disease)     with bronchospasm  . Chest pain     Cardiac catheterization in 1999, 2003, 2008-normal EF, normal coronaries  . GERD (gastroesophageal reflux disease)   . Hypertension   . Meige disease (lymphedema praecox)     blepharospasm; followed at [facility] And treated with botulinum toxin injections  . Osteoarthritis   . Breast cancer     Left mastectomy in 1990s + chemotherapy  . Hyperlipidemia   . Anxiety and depression     H/o suicide attempt  . Skull fracture 1969    surgical repair in 1969  . Hearing impairment   . Tobacco abuse     45 pack years  . Obesity     Current Outpatient Prescriptions  Medication  Sig Dispense Refill  . acetaminophen (TYLENOL) 325 MG tablet Take 2 tablets (650 mg total) by mouth every 6 (six) hours as needed for pain.      Marland Kitchen albuterol (PROAIR HFA) 108 (90 BASE) MCG/ACT inhaler Inhale 2 puffs into the lungs 4 (four) times daily as needed. For shortness of breath       . amLODipine (NORVASC) 10 MG tablet Take 1 tablet (10 mg total) by mouth daily.  30 tablet  11  . atorvastatin (LIPITOR) 40 MG tablet Take 1 tablet (40 mg total) by mouth daily.  30 tablet  6  . celecoxib (CELEBREX) 100 MG capsule Take 100 mg by mouth 2 (two) times daily.      . cetirizine (ZYRTEC) 10 MG tablet Take 10 mg by mouth as needed.      Marland Kitchen HYDROcodone-acetaminophen (NORCO) 10-325 MG per tablet Take 1 tablet by mouth every 6 (six) hours as needed. For pain       .  LORazepam (ATIVAN) 1 MG tablet Take 1 mg by mouth every 8 (eight) hours.        Marland Kitchen morphine (AVINZA) 120 MG 24 hr capsule Take 120 mg by mouth 2 (two) times daily.       . nitroGLYCERIN (NITROSTAT) 0.4 MG SL tablet Place 1 tablet (0.4 mg total) under the tongue every 5 (five) minutes as needed for chest pain.  25 tablet  3  . omeprazole (PRILOSEC) 20 MG capsule Take 20 mg by mouth 2 (two) times daily.        . OnabotulinumtoxinA (BOTOX IJ) Inject 80 Units as directed every 6 (six) months.       Marland Kitchen UNABLE TO FIND Inject as directed every 3 (three) months. Xenomin      . zolpidem (AMBIEN CR) 12.5 MG CR tablet Take 12.5 mg by mouth at bedtime.         No current facility-administered medications for this encounter.    Allergies  Allergen Reactions  . Artane (Trihexyphenidyl) Other (See Comments)    tachycardia  . Aspirin Other (See Comments)    + nonsteroidals-tachycardia  . Cogentin (Benztropine Mesylate) Other (See Comments)    Tachycardia   . Demerol Other (See Comments)    Delirium  . Inderal (Propranolol Hcl) Other (See Comments)    Psychosis  . Levofloxacin Other (See Comments)    Muscle weakness  . Paxil (Paroxetine Hcl) Other (See Comments)    Anxiety   . Procardia (Nifedipine)   . Sulfa Antibiotics   . Penicillins Rash    History   Social History  . Marital Status: Single    Spouse Name: N/A    Number of Children: 0  . Years of Education: N/A   Occupational History  . Disabled     previously case Production designer, theatre/television/film with [employer]   Social History Main Topics  . Smoking status: Current Every Day Smoker -- 1.50 packs/day for 30 years    Types: Cigarettes  . Smokeless tobacco: Never Used  . Alcohol Use: No  . Drug Use: No     Comment: remote marijuana--QUIT SMOKING IN 1990's  . Sexually Active: Not on file   Other Topics Concern  . Not on file   Social History Narrative  . No narrative on file    Family History  Problem Relation Age of Onset  . Coronary artery  disease  67    + mother, sister  . Hypertension      + father, Sister  .  Diabetes      + Sister    PHYSICAL EXAM: Filed Vitals:   09/23/12 1424  BP: 144/90  Weight: 196 lb 1.9 oz (88.959 kg)    General:  Well appearing. No respiratory difficulty HEENT: normal except for poor dentition Neck: supple. no JVD. Carotids 2+ bilat; no bruits. No lymphadenopathy or thryomegaly appreciated. Cor: PMI nondisplaced. Regular rate & rhythm. No rubs, gallops or murmurs. Lungs: diminished breath sounds throughout  Abdomen: soft, nontender, nondistended. No hepatosplenomegaly. No bruits or masses. Good bowel sounds. Extremities: no cyanosis, clubbing, rash, tr edema Neuro: alert & oriented x 3, cranial nerves grossly intact. moves all 4 extremities w/o difficulty. Affect pleasant.    ASSESSMENT & PLAN:

## 2012-09-23 NOTE — Assessment & Plan Note (Addendum)
Have reviewed echo with patient.  She has grade 1 diastolic dysfunction and elevated PAPP by echo.  With her 60 pack year smoking history I feel that her dyspnea maybe more related to lung disease but she does have elevated PA pressures on echo.  Will wait for PFT results if she has severe lung disease will hold off on cath.     Attending. Agree. See below. Counseled on need to stop smoking.

## 2012-09-24 ENCOUNTER — Telehealth: Payer: Self-pay | Admitting: Pulmonary Disease

## 2012-09-24 NOTE — Telephone Encounter (Signed)
Records have been requested. Waiting on results to be faxed--Monday morning per [facility].

## 2012-09-24 NOTE — Telephone Encounter (Signed)
Kathleen Burns, this pt apparently had pfts earlier in the month at [facility].  Can we get these for my review.  Thanks.

## 2012-09-24 NOTE — Assessment & Plan Note (Addendum)
Patient seen and examined with Ulyess Blossom, PA-C. We discussed all aspects of the encounter. I agree with the assessment as stated above.   She has appears to have severe COPD on exam and I suspect her elevated pulmonary pressures on echo are proportional to her COPD. That said, i walked her personally in clinic and she did not desaturate. She is also having rare episodes of CP. We discussed the possibility of R and LHC at length. We will first obtain PFTs, If PFTs show severe lung disease we may defer cath as I suspect PH is WHO group 3. However, if spirometry on PFTs only mlidly down then would proceed with cath. She agrees with this approach although her partner feels she should proceed with cath no matter what. Await results of PFTs.

## 2012-10-01 ENCOUNTER — Telehealth: Payer: Self-pay | Admitting: Pulmonary Disease

## 2012-10-01 NOTE — Telephone Encounter (Signed)
I spoke with Kathleen Burns and she stated she will call first thing Monday morning to get this from [facility] as she does not have access to there PFT's. Will hold in triage

## 2012-10-01 NOTE — Telephone Encounter (Signed)
[name] from [facility] called stating pt had PFT done @ [facility].  Baxter Hire can be reached @ [phone]. Kathleen Burns

## 2012-10-01 NOTE — Telephone Encounter (Signed)
Per kristin she is trying to get in touch with someone at [facility] to get this faxed over to [name].

## 2012-10-01 NOTE — Telephone Encounter (Signed)
Duplicate message.  Kathleen Burns states that she is working on receiving these results.

## 2012-10-01 NOTE — Telephone Encounter (Signed)
I dont see this in EPIC. I called to get this faxed over to triage. Pt aware we are awaiting results

## 2012-10-05 MED ORDER — BUDESONIDE-FORMOTEROL FUMARATE 160-4.5 MCG/ACT IN AERO: 2.0000 | INHALATION_SPRAY | Freq: Two times a day (BID) | RESPIRATORY_TRACT | Status: DC

## 2012-10-05 NOTE — Telephone Encounter (Addendum)
PFT results are in [facility]'s green folder Please advise thanks

## 2012-10-05 NOTE — Telephone Encounter (Signed)
Rx sent to her pharmacy on file.

## 2012-10-05 NOTE — Telephone Encounter (Signed)
Spoke with Belenda Cruise and she is going to fax this to [name] this am to triage fax line Will await results

## 2012-10-05 NOTE — Telephone Encounter (Signed)
Discussed with pt.  She has mild to moderate copd at best, and will need RHC.  Note sent to Dr. Gala Romney  Triage, please call in symbicort 160/4.5  2 inhalations am and pm.  Rinse mouth well after using.  #1, 6 fills.  Pt is aware this is being called in.

## 2012-10-06 NOTE — Procedures (Signed)
NAMEMarland Kitchen  TANICIA, WOLAVER NO.:  [id number]  MEDICAL RECORD NO.:  [id number]  LOCATION:                                 FACILITY:  PHYSICIAN:  Clydene Burack L. Juanetta Gosling, M.D.DATE OF BIRTH:  1956/03/03  DATE OF PROCEDURE:  10/04/2012 DATE OF DISCHARGE:                           PULMONARY FUNCTION TEST   Reason for pulmonary function testing is dyspnea on exertion. 1. Spirometry shows a mild ventilatory defect with evidence of airflow     obstruction. 2. Lung volumes are normal. 3. DLCO is severely reduced, but does correct somewhat when     ventilation is taken into account. 4. Airway resistance is normal. 5. There is no significant bronchodilator improvement. 6. This study shows some airflow obstruction with marked reduction in     DLCO suggesting that perhaps there is a component of chemotherapy     involved.  Clinical correlation is suggested. 7. I apologize for the delay in report.  I just proceed the pulmonary     function test on the day of dictation.     Haidar Muse L. Juanetta Gosling, M.D.     ELH/MEDQ  D:  10/04/2012  T:  10/05/2012  Job:  [id number]  cc:   Barbaraann Share, MD,FCCP

## 2012-10-12 LAB — PULMONARY FUNCTION TEST

## 2012-10-20 ENCOUNTER — Telehealth (HOSPITAL_COMMUNITY): Payer: Self-pay | Admitting: *Deleted

## 2012-10-20 ENCOUNTER — Encounter (HOSPITAL_COMMUNITY): Payer: Self-pay | Admitting: *Deleted

## 2012-10-20 NOTE — Telephone Encounter (Signed)
Message copied by Noralee Space on Wed Oct 20, 2012  4:10 PM ------      Message from: Hadassah Pais      Created: Thu Oct 14, 2012  8:54 PM       Hey are you setting up cath for Ms Montez Morita?                  ----- Message -----         From: Dolores Patty, MD         Sent: 10/14/2012   5:43 PM           To: Hadassah Pais, PA-C            Yes. [facility] already called about it. I think Herbert Seta was setting up. Can you confirm.                   ----- Message -----         From: Hadassah Pais, PA-C         Sent: 10/14/2012   4:15 PM           To: Dolores Patty, MD            She has mild to mod COPD.  Do you want to proceed with cath?            Thanks.      ----- Message -----         From: Hadassah Pais, PA-C         Sent: 09/24/2012  11:55 AM           To: Hadassah Pais, PA-C            Follow up PFT results for +/- cath                   ------

## 2012-10-20 NOTE — Telephone Encounter (Signed)
Cath sch for 5/2 at 9:30 pt is aware and instructions reviewed with her via phone, she will go to [facility] on Mon 4/28 for labs, order faxed to them

## 2012-10-21 ENCOUNTER — Other Ambulatory Visit (HOSPITAL_COMMUNITY): Payer: Self-pay | Admitting: Physician Assistant

## 2012-10-21 DIAGNOSIS — I272 Pulmonary hypertension, unspecified: Secondary | ICD-10-CM

## 2012-10-29 ENCOUNTER — Encounter (HOSPITAL_BASED_OUTPATIENT_CLINIC_OR_DEPARTMENT_OTHER)
Admission: RE | Disposition: A | Discharge status: Home / Self Care | Payer: Self-pay | Source: Ambulatory Visit | Attending: Internal Medicine

## 2012-10-29 ENCOUNTER — Inpatient Hospital Stay (HOSPITAL_BASED_OUTPATIENT_CLINIC_OR_DEPARTMENT_OTHER)
Admission: RE | Admit: 2012-10-29 | Discharge: 2012-10-29 | Disposition: A | Discharge status: Home / Self Care | Payer: Managed Care, Other (non HMO) | Source: Ambulatory Visit | Attending: Internal Medicine | Admitting: Internal Medicine

## 2012-10-29 ENCOUNTER — Telehealth: Payer: Self-pay | Admitting: Pulmonary Disease

## 2012-10-29 ENCOUNTER — Encounter (HOSPITAL_BASED_OUTPATIENT_CLINIC_OR_DEPARTMENT_OTHER): Payer: Self-pay | Admitting: *Deleted

## 2012-10-29 DIAGNOSIS — I2789 Other specified pulmonary heart diseases: Secondary | ICD-10-CM | POA: Insufficient documentation

## 2012-10-29 DIAGNOSIS — R0989 Other specified symptoms and signs involving the circulatory and respiratory systems: Secondary | ICD-10-CM | POA: Insufficient documentation

## 2012-10-29 DIAGNOSIS — J449 Chronic obstructive pulmonary disease, unspecified: Secondary | ICD-10-CM | POA: Insufficient documentation

## 2012-10-29 DIAGNOSIS — F17200 Nicotine dependence, unspecified, uncomplicated: Secondary | ICD-10-CM | POA: Insufficient documentation

## 2012-10-29 DIAGNOSIS — R0789 Other chest pain: Secondary | ICD-10-CM | POA: Insufficient documentation

## 2012-10-29 DIAGNOSIS — J4489 Other specified chronic obstructive pulmonary disease: Secondary | ICD-10-CM | POA: Insufficient documentation

## 2012-10-29 DIAGNOSIS — I2510 Atherosclerotic heart disease of native coronary artery without angina pectoris: Secondary | ICD-10-CM

## 2012-10-29 DIAGNOSIS — R0609 Other forms of dyspnea: Secondary | ICD-10-CM | POA: Insufficient documentation

## 2012-10-29 DIAGNOSIS — I279 Pulmonary heart disease, unspecified: Secondary | ICD-10-CM

## 2012-10-29 DIAGNOSIS — I10 Essential (primary) hypertension: Secondary | ICD-10-CM | POA: Insufficient documentation

## 2012-10-29 DIAGNOSIS — I272 Pulmonary hypertension, unspecified: Secondary | ICD-10-CM

## 2012-10-29 LAB — POCT I-STAT 3, VENOUS BLOOD GAS (G3P V)
Bicarbonate: 21.4 mEq/L (ref 20.0–24.0)
TCO2: 23 mmol/L (ref 0–100)
TCO2: 27 mmol/L (ref 0–100)
pCO2, Ven: 40.5 mmHg — ABNORMAL LOW (ref 45.0–50.0)
pCO2, Ven: 45.1 mmHg (ref 45.0–50.0)
pH, Ven: 7.33 — ABNORMAL HIGH (ref 7.250–7.300)
pO2, Ven: 31 mmHg (ref 30.0–45.0)

## 2012-10-29 LAB — POCT I-STAT 3, ART BLOOD GAS (G3+)
TCO2: 24 mmol/L (ref 0–100)
pCO2 arterial: 37.7 mmHg (ref 35.0–45.0)
pH, Arterial: 7.389 (ref 7.350–7.450)

## 2012-10-29 SURGERY — JV LEFT HEART CATHETERIZATION WITH CORONARY ANGIOGRAM

## 2012-10-29 MED ORDER — ACETAMINOPHEN 325 MG PO TABS: 650.0000 mg | ORAL_TABLET | ORAL | Status: DC | PRN

## 2012-10-29 MED ORDER — HEPARIN SODIUM (PORCINE) 5000 UNIT/ML IJ SOLN
5000.0000 [IU] | Freq: Three times a day (TID) | INTRAMUSCULAR | Status: DC
Start: 2012-10-29 — End: 2012-10-29

## 2012-10-29 MED ORDER — SODIUM CHLORIDE 0.9 % IJ SOLN: 3.0000 mL | Freq: Two times a day (BID) | INTRAMUSCULAR | Status: DC

## 2012-10-29 MED ORDER — SODIUM CHLORIDE 0.9 % IJ SOLN: 3.0000 mL | INTRAMUSCULAR | Status: DC | PRN

## 2012-10-29 MED ORDER — SODIUM CHLORIDE 0.9 % IV SOLN: INTRAVENOUS | Status: DC

## 2012-10-29 MED ORDER — SODIUM CHLORIDE 0.9 % IV SOLN
250.0000 mL | INTRAVENOUS | Status: DC | PRN
  Administered 2012-10-29: 250 mL via INTRAVENOUS

## 2012-10-29 MED ORDER — ONDANSETRON HCL 4 MG/2ML IJ SOLN: 4.0000 mg | Freq: Four times a day (QID) | INTRAMUSCULAR | Status: DC | PRN

## 2012-10-29 NOTE — Interval H&P Note (Signed)
History and Physical Interval Note:  10/29/2012 9:06 AM  Kathleen Burns  has presented today for surgery, with the diagnosis of dyspnea/chest pressure.The various methods of treatment have been discussed with the patient and family. After consideration of risks, benefits and other options for treatment, the patient has consented to  Procedure(s): JV LEFT HEART CATHETERIZATION WITH CORONARY ANGIOGRAM (N/A) as a surgical intervention .  The patient's history has been reviewed, patient examined, no change in status, stable for surgery.  I have reviewed the patient's chart and labs.  Questions were answered to the patient's satisfaction.     Daniel Bensimhon

## 2012-10-29 NOTE — Telephone Encounter (Signed)
Kathleen Burns, this pt needs ov with me in next 2-3 weeks to followup her copd, and right heart cath findings.

## 2012-10-29 NOTE — H&P (Signed)
Referring Physician: Dr. Shelle Iron  Primary Care:  Primary Cardiologist: Dr. Dietrich Pates  Pulmonologist: Dr. Shelle Iron   HPI:  Kathleen Burns is a 57 y.o. caucasian female history of HTN, HL, tobacco history for the last 30 years with 2 ppd. Cath in 2008 showed normal coronary arteries.   She was referred to pulmonary for dyspnea concerning for pulmonary hypertension. Dr Shelle Iron evaluated the patient on 3/4. PFTs have been completed but results are not available. CXR 08/31/12 showed very large hiatal hernia, COPD/chronic lung changes with no acute abnormalities. He has referred her for possible right heart catheterization.  05/26/13: Echo EF 60-65%, [name], [name]  Review of Systems: [y] = yes, [ ]  = no   General: Weight gain [ ] ; Weight loss [ ] ; Anorexia [ ] ; Fatigue [ ] ; Fever [ ] ; Chills [ ] ; Weakness [ ]   Cardiac: Chest pain/pressure Cove.Etienne ]; Resting SOB [ ] ; Exertional SOB Cove.Etienne ]; Orthopnea [ ] ; Pedal Edema [ ] ; Palpitations [ ] ; Syncope [ ] ; Presyncope [ ] ; Paroxysmal nocturnal dyspnea[ ]   Pulmonary: Cough [ y]; Wheezing[ ] ; Hemoptysis[ ] ; Sputum [ ] ; Snoring [ ]   GI: Vomiting[ ] ; Dysphagia[ ] ; Melena[ ] ;  Hematochezia [ ] ; Heartburn[ ] ; Abdominal pain [ ] ; Constipation [ ] ; Diarrhea [ ] ; BRBPR [ ]   GU: Hematuria[ ] ; Dysuria [ ] ; Nocturia[ ]   Vascular: Pain in legs with walking [ ] ; Pain in feet with lying flat [ ] ; Non-healing sores [ ] ; Stroke [ ] ; TIA [ ] ; Slurred speech [ ] ;  Neuro: Headaches[ ] ; Vertigo[ ] ; Seizures[ ] ; Paresthesias[ ] ;Blurred vision [ ] ; Diplopia [ ] ; Vision changes [ ]   Ortho/Skin: Arthritis [ ] ; Joint pain Cove.Etienne ]; Muscle pain Cove.Etienne ]; Joint swelling [ ] ; Back Pain [ ] ; Rash [ ]   Psych: Depression[ ] ; Anxiety[ y]  Heme: Bleeding problems [ ] ; Clotting disorders [ ] ; Anemia [ ]   Endocrine: Diabetes [ ] ; Thyroid dysfunction[ ]   Past Medical History   Diagnosis  Date   .  COPD (chronic obstructive pulmonary disease)      with bronchospasm   .  Chest pain      Cardiac catheterization in 1999, 2003, 2008-normal EF, normal coronaries   .  GERD (gastroesophageal reflux disease)    .  Hypertension    .  Meige disease (lymphedema praecox)      blepharospasm; followed at [facility] And treated with botulinum toxin injections   .  Osteoarthritis    .  Breast cancer      Left mastectomy in 1990s + chemotherapy   .  Hyperlipidemia    .  Anxiety and depression  H/o suicide attempt   .  Skull fracture  1969     surgical repair in 1969   .  Hearing impairment    .  Tobacco abuse      45 pack years   .  Obesity     Current Outpatient Prescriptions   Medication  Sig  Dispense  Refill   .  acetaminophen (TYLENOL) 325 MG tablet  Take 2 tablets (650 mg total) by mouth every 6 (six) hours as needed for pain.     Marland Kitchen  albuterol (PROAIR HFA) 108 (90 BASE) MCG/ACT inhaler  Inhale 2 puffs into the lungs 4 (four) times daily as needed. For shortness of breath     .  amLODipine (NORVASC) 10 MG tablet  Take 1 tablet (10 mg total) by mouth daily.  30 tablet  11   .  atorvastatin (LIPITOR) 40 MG tablet  Take 1 tablet (40 mg total) by mouth daily.  30 tablet  6   .  celecoxib (CELEBREX) 100 MG  capsule  Take 100 mg by mouth 2 (two) times daily.     .  cetirizine (ZYRTEC) 10 MG tablet  Take 10 mg by mouth as needed.     Marland Kitchen  HYDROcodone-acetaminophen (NORCO) 10-325 MG per tablet  Take 1 tablet by mouth every 6 (six) hours as needed. For pain     .  LORazepam (ATIVAN) 1 MG tablet  Take 1 mg by mouth every 8 (eight) hours.     Marland Kitchen  morphine (AVINZA) 120 MG 24 hr capsule  Take 120 mg by mouth 2 (two) times daily.     .  nitroGLYCERIN (NITROSTAT) 0.4 MG SL tablet  Place 1 tablet (0.4 mg total) under the tongue every 5 (five) minutes as needed for chest pain.  25 tablet  3   .  omeprazole (PRILOSEC) 20 MG capsule  Take 20 mg by mouth 2 (two) times daily.     .  OnabotulinumtoxinA (BOTOX IJ)  Inject 80 Units as directed every 6 (six) months.     Marland Kitchen  UNABLE TO FIND  Inject as directed every 3 (three) months. Xenomin     .  zolpidem (AMBIEN CR) 12.5 MG CR tablet  Take 12.5 mg by mouth at bedtime.      No current facility-administered medications for this encounter.    Allergies   Allergen  Reactions   .  Artane (Trihexyphenidyl)  Other (See Comments)     tachycardia   .  Aspirin  Other (See Comments)     + nonsteroidals-tachycardia   .  Cogentin (Benztropine Mesylate)  Other (See Comments)     Tachycardia   .  Demerol  Other (See Comments)     Delirium   .  Inderal (Propranolol Hcl)  Other (See Comments)     Psychosis   .  Levofloxacin  Other (See Comments)     Muscle weakness   .  Paxil (Paroxetine Hcl)  Other (See Comments)     Anxiety   .  Procardia (Nifedipine)    .  Sulfa Antibiotics    .  Penicillins  Rash    History    Social History   .  Marital Status:  Single     Spouse Name:  N/A     Number of Children:  0   .  Years of Education:  N/A    Occupational History   .  Disabled      previously case Production designer, theatre/television/film  with [employer]    Social History Main Topics   .  Smoking status:  Current Every Day Smoker -- 1.50 packs/day for 30 years     Types:  Cigarettes   .  Smokeless  tobacco:  Never Used   .  Alcohol Use:  No   .  Drug Use:  No      Comment: remote marijuana--QUIT SMOKING IN 1990's   .  Sexually Active:  Not on file    Other Topics  Concern   .  Not on file    Social History Narrative   .  No narrative on file    Family History   Problem  Relation  Age of Onset   .  Coronary artery disease   44     + mother, sister   .  Hypertension       + father, Sister   .  Diabetes       + Sister   PHYSICAL EXAM:  Filed Vitals:      BP:  153/97 HR 84  Weight:  196 lb 1.9 oz (88.959 kg)   General: Older than stated age. No respiratory difficulty  HEENT: normal except for poor dentition  Neck: supple. no JVD. Carotids 2+ bilat; no bruits. No lymphadenopathy or thryomegaly appreciated.  Cor: PMI nondisplaced. Regular rate & rhythm. No rubs, gallops or murmurs.  Lungs: diminished breath sounds throughout  Abdomen: soft, nontender, nondistended. No hepatosplenomegaly. No bruits or masses. Good bowel sounds.  Extremities: no cyanosis, clubbing, rash, tr edema  Neuro: alert & oriented x 3, cranial nerves grossly intact. moves all 4 extremities w/o difficulty. Affect pleasant.   ASSESSMENT & PLAN: 1. Dyspnea on exertion with PAH on echo 2. COPD with ongoing tobacco use 3. Chest pressure  Proceed with R&L hear cath. She understands risks and indications and wants to proceed.   Christophere Hillhouse,MD 9:06 AM

## 2012-10-29 NOTE — Telephone Encounter (Signed)
LMOM x 1 

## 2012-10-29 NOTE — CV Procedure (Addendum)
Cardiac Cath Procedure Note  Indication: Pulmonary HTN, dyspnea, chest pressure  Procedures performed:  1) Right heart cathererization 2) Selective coronary angiography 3) Left heart catheterization 4) Left ventriculogram  Description of procedure:     The risks and indication of the procedure were explained. Consent was signed and placed on the chart. An appropriate timeout was taken prior to the procedure. The right groin was prepped and draped in the routine sterile fashion and anesthetized with 1% local lidocaine.   A 5 FR arterial sheath was placed in the right femoral artery using a modified Seldinger technique. Standard catheters including a JL4, JR4 and angled pigtail were used. All catheter exchanges were made over a wire. A 7 FR venous sheath was placed in the right femoral vein using a modified Seldinger technique. A standard Swan-Ganz catheter was used for the procedure.   Complications:  None apparent  Findings:  RA = 4 RV = 53/8/7 PA = 53/26 (37) PCW = 15 Fick cardiac output/index = 4.2/2.1 PVR = 5.3 woods FA sat = 87% (after sedation) PA sat = 56%  Ao Pressure: 153/83 (112) LV Pressure: 142/13/15-18 There was no signficant gradient across the aortic valve on pullback.  Left main: Normal  LAD: 40% proximal lesion. Otherwise normal. 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   LCX: Gives off tiny ramus, tiny OM-1, Large OM-2, small PL. Mild plaque in proximal and midsection  RCA: Large dominant vessel with PDA and 2PLs. 30% mid stenosis.  LV-gram done in the RAO projection: Ejection fraction = 60%. Normal wall motion.   Assessment: 1. Mild non-obstructive CAD 2. Mild to moderate PAH with PVR 5.3 Woods 3. Normal LV function  Plan/Discussion:  PAH appears proportionate to COPD. (May also have some chronically elevated PA pressures due to longstanding diastolic dysfunction). Doubt she would benefit much from selective pulmonary  vasodilators. Will d/w Dr. Shelle Iron. Encouraged her to stop smoking. Assess need for nighttime O2. Control BP.   Arvilla Meres, MD 9:33 AM

## 2012-11-10 ENCOUNTER — Encounter: Payer: Self-pay | Admitting: Adult Health

## 2012-11-10 ENCOUNTER — Ambulatory Visit (INDEPENDENT_AMBULATORY_CARE_PROVIDER_SITE_OTHER): Payer: Managed Care, Other (non HMO) | Admitting: Adult Health

## 2012-11-10 VITALS — BP 134/94 | HR 88 | Ht 66.0 in | Wt 196.0 lb

## 2012-11-10 DIAGNOSIS — I2789 Other specified pulmonary heart diseases: Secondary | ICD-10-CM | POA: Diagnosis not present

## 2012-11-10 DIAGNOSIS — E785 Hyperlipidemia, unspecified: Secondary | ICD-10-CM | POA: Diagnosis not present

## 2012-11-10 DIAGNOSIS — Z720 Tobacco use: Secondary | ICD-10-CM

## 2012-11-10 DIAGNOSIS — I10 Essential (primary) hypertension: Secondary | ICD-10-CM

## 2012-11-10 DIAGNOSIS — F17200 Nicotine dependence, unspecified, uncomplicated: Secondary | ICD-10-CM

## 2012-11-10 DIAGNOSIS — I509 Heart failure, unspecified: Secondary | ICD-10-CM | POA: Diagnosis not present

## 2012-11-10 DIAGNOSIS — I272 Pulmonary hypertension, unspecified: Secondary | ICD-10-CM

## 2012-11-10 MED ORDER — AMLODIPINE BESYLATE 10 MG PO TABS: 10.0000 mg | ORAL_TABLET | Freq: Every day | ORAL | Status: DC

## 2012-11-10 NOTE — Assessment & Plan Note (Signed)
Ongoing issue. She has no plans to quit. Hopefully rehab will be helpful with this

## 2012-11-10 NOTE — Assessment & Plan Note (Signed)
Blood pressure is well controlled. No changes in medications.  

## 2012-11-10 NOTE — Assessment & Plan Note (Signed)
She is advised to enroll in pulmonary rehab. This will be set up with [facility].

## 2012-11-10 NOTE — Progress Notes (Signed)
HPI: Kathleen Burns is an anxious 57 year old patient of Dr. Bryan Lemma. [facility] following for ongoing assessment and treatment of hypertension, hyperlipidemia, chronic dyspnea on exertion, with history of COPD and ongoing tobacco abuse. The patient had recent cardiac catheterization per Dr. Lyndee Hensen, 10/29/2012, to include the right and left chamber evaluation. Coronaries were found to have mild nonobstructive CAD most dominant in the LAD with a 40% proximal lesion there was mid RCA stenosis of 30%. He was found to have pulmonary hypertension which was proportionate to COPD. She was recommended followup with Dr. Shelle Iron, pulmonologist,  for ongoing treatment for COPD. She was strongly encouraged to stop smoking.   No further chest pain is expressed., She is to be referred to pulmonary rehab.  Allergies  Allergen Reactions  . Artane (Trihexyphenidyl) Other (See Comments)    tachycardia  . Aspirin Other (See Comments)    + nonsteroidals-tachycardia  . Cogentin (Benztropine Mesylate) Other (See Comments)    Tachycardia   . Demerol Other (See Comments)    Delirium  . Inderal (Propranolol Hcl) Other (See Comments)    Psychosis  . Levofloxacin Other (See Comments)    Muscle weakness  . Paxil (Paroxetine Hcl) Other (See Comments)    Anxiety   . Procardia (Nifedipine)   . Sulfa Antibiotics   . Penicillins Rash    Current Outpatient Prescriptions  Medication Sig Dispense Refill  . acetaminophen (TYLENOL) 325 MG tablet Take 2 tablets (650 mg total) by mouth every 6 (six) hours as needed for pain.      Marland Kitchen albuterol (PROAIR HFA) 108 (90 BASE) MCG/ACT inhaler Inhale 2 puffs into the lungs 4 (four) times daily as needed. For shortness of breath       . amLODipine (NORVASC) 10 MG tablet Take 1 tablet (10 mg total) by mouth daily.  30 tablet  11  . atorvastatin (LIPITOR) 40 MG tablet Take 1 tablet (40 mg total) by mouth daily.  30 tablet  6  . budesonide-formoterol (SYMBICORT) 160-4.5 MCG/ACT  inhaler Inhale 2 puffs into the lungs 2 (two) times daily.  1 Inhaler  6  . celecoxib (CELEBREX) 100 MG capsule Take 100 mg by mouth 2 (two) times daily.      . cetirizine (ZYRTEC) 10 MG tablet Take 10 mg by mouth as needed.      Marland Kitchen HYDROcodone-acetaminophen (NORCO) 10-325 MG per tablet Take 1 tablet by mouth every 6 (six) hours as needed. For pain       . LORazepam (ATIVAN) 1 MG tablet Take 1 mg by mouth every 8 (eight) hours.        Marland Kitchen morphine (AVINZA) 120 MG 24 hr capsule Take 120 mg by mouth 2 (two) times daily.       . nitroGLYCERIN (NITROSTAT) 0.4 MG SL tablet Place 1 tablet (0.4 mg total) under the tongue every 5 (five) minutes as needed for chest pain.  25 tablet  3  . omeprazole (PRILOSEC) 20 MG capsule Take 20 mg by mouth 2 (two) times daily.        . OnabotulinumtoxinA (BOTOX IJ) Inject 80 Units as directed every 6 (six) months.       Marland Kitchen UNABLE TO FIND Inject as directed every 3 (three) months. Xenomin      . zolpidem (AMBIEN CR) 12.5 MG CR tablet Take 12.5 mg by mouth at bedtime.         No current facility-administered medications for this visit.    Past Medical History  Diagnosis Date  .  COPD (chronic obstructive pulmonary disease)     with bronchospasm  . Chest pain     Cardiac catheterization in 1999, 2003, 2008-normal EF, normal coronaries  . GERD (gastroesophageal reflux disease)   . Hypertension   . Meige disease (lymphedema praecox)     blepharospasm; followed at [facility] And treated with botulinum toxin injections  . Osteoarthritis   . Breast cancer     Left mastectomy in 1990s + chemotherapy  . Hyperlipidemia   . Anxiety and depression     H/o suicide attempt  . Skull fracture 1969    surgical repair in 1969  . Hearing impairment   . Tobacco abuse     45 pack years  . Obesity     Past Surgical History  Procedure Laterality Date  . Mastectomy  1990    Left  . Back surgery  1998 and  . Eye surgery  1999, 2001  . Skull fracture elevation  1969  . Carpal  tunnel release  1996  . Shoulder surgery  1996    Left  . Knee surgery  1999    Left  . Cholecystectomy  1996  . Total abdominal hysterectomy w/ bilateral salpingoophorectomy  1994  . Tonsillectomy    . Colonoscopy  2011    No significant abnormalities    ZOX:WRUEAV of systems complete and found to be negative unless listed above  PHYSICAL EXAM BP 134/94  Pulse 88  Ht 5\' 6"  (1.676 m)  Wt 196 lb (88.905 kg)  BMI 31.65 kg/m2  SpO2 92% General: Well developed, well nourished, in no acute distress Head: Eyes PERRLA, No xanthomas.   Normal cephalic and atraumatic,  Several missing teeth. Lungs: Clear bilaterally to auscultation and percussion. Heart: HRRR S1 S2, without MRG.  Pulses are 2+ & equal.            No carotid bruit. No JVD.  No abdominal bruits. No femoral bruits. Abdomen: Bowel sounds are positive, abdomen soft and non-tender without masses or                  Hernia's noted. Msk:  Back normal, normal gait. Normal strength and tone for age. Extremities: No clubbing, cyanosis or edema.  DP +1 Neuro: Alert and oriented X 3. Psych:  Good affect, responds appropriately  EKG:NSR rate of 88 bpm  ASSESSMENT AND PLAN

## 2012-11-10 NOTE — Addendum Note (Signed)
Addended by: Antony Odea on: 11/10/2012 02:34 PM   Modules accepted: Orders

## 2012-11-10 NOTE — Patient Instructions (Addendum)
Your physician wants you to follow-up in: 6 months  You will receive a reminder letter in the mail two months in advance. If you don't receive a letter, please call our office to schedule the follow-up appointment.  Your physician recommends that you continue on your current medications as directed. Please refer to the Current Medication list given to you today.  

## 2012-11-10 NOTE — Assessment & Plan Note (Signed)
She has no evidence of fluid overload.  Continue current medication regimen.

## 2012-11-15 ENCOUNTER — Encounter: Payer: Self-pay | Admitting: Pulmonary Disease

## 2012-11-15 ENCOUNTER — Ambulatory Visit (INDEPENDENT_AMBULATORY_CARE_PROVIDER_SITE_OTHER): Payer: Managed Care, Other (non HMO) | Admitting: Pulmonary Disease

## 2012-11-15 VITALS — BP 148/100 | HR 97 | Temp 96.7°F | Ht 66.0 in | Wt 198.8 lb

## 2012-11-15 DIAGNOSIS — J438 Other emphysema: Secondary | ICD-10-CM

## 2012-11-15 DIAGNOSIS — I2789 Other specified pulmonary heart diseases: Secondary | ICD-10-CM

## 2012-11-15 DIAGNOSIS — J439 Emphysema, unspecified: Secondary | ICD-10-CM | POA: Insufficient documentation

## 2012-11-15 DIAGNOSIS — I272 Pulmonary hypertension, unspecified: Secondary | ICD-10-CM

## 2012-11-15 MED ORDER — VARENICLINE TARTRATE 1 MG PO TABS: 1.0000 mg | ORAL_TABLET | Freq: Two times a day (BID) | ORAL | Status: DC

## 2012-11-15 MED ORDER — VARENICLINE TARTRATE 0.5 MG X 11 & 1 MG X 42 PO MISC: ORAL | Status: DC

## 2012-11-15 NOTE — Progress Notes (Signed)
  Subjective:    Patient ID: Kathleen Burns, female    DOB: 1956/01/18, 57 y.o.   MRN: [id number]  HPI Patient comes in today for followup of her multifactorial dyspnea on exertion, with documented COPD and pulmonary hypertension.  She has been tried on symbicort, but is unable to tolerate this for multiple reasons.  Unfortunately, she continues to smoke.  She has had a recent left and right heart catheterization, with nonobstructive coronary disease is seen.  She also had moderate pulmonary hypertension with a mildly elevated pulmonary artery occlusion pressure.  Her pulmonary vascular resistance was calculated at approximately 4.8 Wood units.   Review of Systems  Constitutional: Negative for fever and unexpected weight change.  HENT: Positive for rhinorrhea. Negative for ear pain, nosebleeds, congestion, sore throat, sneezing, trouble swallowing, dental problem, postnasal drip and sinus pressure.   Eyes: Negative for redness and itching.  Respiratory: Negative for cough, chest tightness, shortness of breath and wheezing.   Cardiovascular: Negative for palpitations and leg swelling.  Gastrointestinal: Negative for nausea and vomiting.  Genitourinary: Negative for dysuria.  Musculoskeletal: Negative for joint swelling.  Skin: Negative for rash.  Neurological: Negative for headaches.  Hematological: Does not bruise/bleed easily.  Psychiatric/Behavioral: Negative for dysphoric mood. The patient is not nervous/anxious.        Objective:   Physical Exam Well-developed female in no acute distress Nose without purulence or discharge noted Chest with decreased breath sounds, but adequate air flow, no active wheezing Cardiac exam with regular rate and rhythm Lower extremities with mild edema, no cyanosis Alert and oriented, moves all 4 extremities.       Assessment & Plan:

## 2012-11-15 NOTE — Patient Instructions (Addendum)
Stop symbicort and start breo one inhalation each am.   Let me know if this works well, and we can send in prescription. Stop smoking.  Will prescribe chantix starter pack and one month maintenance.  Let us know how this goes.  Will check oxygen level overnight one night, and let you know the results. Keep blood pressure controlled. followup with me in 6mos.

## 2012-11-15 NOTE — Assessment & Plan Note (Signed)
The patient has moderate COPD by her recent pulmonary function studies, and unfortunately continues to smoke.  I have stressed to her the total smoking cessation is the treatment of choice, and would also like to try and get her on a LABA/ICS for maintenance.  She was unable to tolerate symbicort, so therefore we'll try BREO.

## 2012-11-15 NOTE — Assessment & Plan Note (Signed)
The patient has mild to moderate pulmonary hypertension as I suspect is secondary to her COPD as well as diastolic dysfunction.  At this point, I have stressed the importance of total smoking cessation, staying on a bronchodilator regimen for maintenance, making sure that she is adequately saturated at night, and finally treatment of her hypertension which will lead to treatment of her diastolic dysfunction.

## 2012-11-26 DIAGNOSIS — I2789 Other specified pulmonary heart diseases: Secondary | ICD-10-CM | POA: Diagnosis not present

## 2012-12-01 NOTE — Telephone Encounter (Signed)
Pt had f/u appt 5/19 with KC.

## 2012-12-07 ENCOUNTER — Other Ambulatory Visit: Payer: Self-pay | Admitting: Pulmonary Disease

## 2012-12-07 ENCOUNTER — Encounter: Payer: Self-pay | Admitting: Pulmonary Disease

## 2012-12-07 ENCOUNTER — Telehealth: Payer: Self-pay | Admitting: Pulmonary Disease

## 2012-12-07 DIAGNOSIS — J439 Emphysema, unspecified: Secondary | ICD-10-CM

## 2012-12-07 DIAGNOSIS — I272 Pulmonary hypertension, unspecified: Secondary | ICD-10-CM

## 2012-12-07 MED ORDER — FLUTICASONE FUROATE-VILANTEROL 100-25 MCG/INH IN AEPB: 1.0000 | INHALATION_SPRAY | Freq: Every day | RESPIRATORY_TRACT | Status: DC

## 2012-12-07 NOTE — Telephone Encounter (Signed)
LMTCB

## 2012-12-07 NOTE — Telephone Encounter (Signed)
Dr. Shelle Iron I have placed the ONO results in your green folder. Pt is requesting the results. Please advise. Carron Curie, CMA

## 2012-12-07 NOTE — Telephone Encounter (Signed)
Per last OV note pt was to try breo and if it worked well for her then we could send in RX. Refill sent. Pt is aware. Carron Curie, CMA

## 2012-12-07 NOTE — Telephone Encounter (Signed)
Please let pt know that her oxygen level at night dropped to 80%, and spent over 2 hrs less than 88%.  Will need to be started on oxygen at 2 liters everynight while sleeping.  Order sent to [facility].

## 2012-12-07 NOTE — Telephone Encounter (Signed)
Pt has been instructed of results via patient email. Kathleen Burns, CMA

## 2012-12-08 ENCOUNTER — Telehealth: Payer: Self-pay | Admitting: Pulmonary Disease

## 2012-12-09 MED ORDER — MOMETASONE FURO-FORMOTEROL FUM 100-5 MCG/ACT IN AERO: 2.0000 | INHALATION_SPRAY | Freq: Two times a day (BID) | RESPIRATORY_TRACT | Status: DC

## 2012-12-09 NOTE — Telephone Encounter (Signed)
Pt ware of recs. RX has been sent

## 2012-12-09 NOTE — Telephone Encounter (Signed)
Called [facility], spoke with Tammy PA info to be faxed to triage Will await fax to initiate PA

## 2012-12-09 NOTE — Telephone Encounter (Signed)
PA info received Marchia Bond @ [phone] and spoke with representative Boneta Lucks The covered alternatives are: Advair diskus all strengths Advair HFA all strengths Symbicort both strengths [facility] both strengths  Dr Shelle Iron please advise if you would like to switch pt to a covered alternative, thank you.

## 2012-12-09 NOTE — Telephone Encounter (Signed)
Lets try dulera 100/5  2 inhalations am and pm. Rinse well after using.

## 2012-12-09 NOTE — Telephone Encounter (Signed)
Kathleen Burns, have you received the PA form for Breo? Please advise thanks!

## 2012-12-21 ENCOUNTER — Encounter: Payer: Self-pay | Admitting: Pulmonary Disease

## 2012-12-21 ENCOUNTER — Telehealth: Payer: Self-pay | Admitting: Pulmonary Disease

## 2012-12-21 NOTE — Telephone Encounter (Signed)
Dr Shelle Iron have you had a chance to contact physician?  Just checking so that I may close this telephone note.

## 2012-12-21 NOTE — Telephone Encounter (Signed)
Just saw message. Will call them first thing in am.

## 2012-12-21 NOTE — Telephone Encounter (Signed)
Ethelene Browns Robinson,PA//[facility] Wants to speak t KC in ref to this patient. [phone] Information placed on [facility] desk to call.

## 2012-12-22 ENCOUNTER — Telehealth: Payer: Self-pay | Admitting: Pulmonary Disease

## 2012-12-22 NOTE — Telephone Encounter (Signed)
Per phone msg from 12/21/12, Albertina Parr, PA can be reached at [phone].  This is the office #. Called office, was transferred to [facility].  I transferred call directly to [facility]. Will route msg back to him if any further documentation is needed.

## 2012-12-22 NOTE — Telephone Encounter (Signed)
Have heard from PA, and discussed case in detail.

## 2012-12-22 NOTE — Telephone Encounter (Signed)
I have tried to return the call of Kathleen Burns, the PA about this pt multiple times this am and put on terminal hold.  I am happy to call back and discuss pt if I can get a direct number to the back without waiting.  Please if there is a number for that.

## 2012-12-23 NOTE — Telephone Encounter (Signed)
Will close message.  

## 2013-05-05 ENCOUNTER — Other Ambulatory Visit: Payer: Self-pay

## 2013-05-18 ENCOUNTER — Ambulatory Visit: Payer: Managed Care, Other (non HMO) | Admitting: Pulmonary Disease

## 2013-06-21 ENCOUNTER — Encounter: Payer: Self-pay | Admitting: Pulmonary Disease

## 2013-06-21 ENCOUNTER — Ambulatory Visit (INDEPENDENT_AMBULATORY_CARE_PROVIDER_SITE_OTHER): Payer: Managed Care, Other (non HMO) | Admitting: Pulmonary Disease

## 2013-06-21 VITALS — BP 132/88 | HR 105 | Temp 97.9°F | Ht 66.0 in | Wt 193.0 lb

## 2013-06-21 DIAGNOSIS — J438 Other emphysema: Secondary | ICD-10-CM | POA: Diagnosis not present

## 2013-06-21 DIAGNOSIS — J441 Chronic obstructive pulmonary disease with (acute) exacerbation: Secondary | ICD-10-CM | POA: Diagnosis not present

## 2013-06-21 DIAGNOSIS — J439 Emphysema, unspecified: Secondary | ICD-10-CM

## 2013-06-21 MED ORDER — PREDNISONE 10 MG PO TABS: ORAL_TABLET | ORAL | Status: DC

## 2013-06-21 MED ORDER — CEFDINIR 300 MG PO CAPS: 600.0000 mg | ORAL_CAPSULE | Freq: Every day | ORAL | Status: DC

## 2013-06-21 NOTE — Patient Instructions (Signed)
Will treat with a 6 day course of prednisone Will give you omnicef 300mg , take 2 each am for 5 days for developing bronchitis. I have sent a note to your cardiology office Stop smoking.  This is the key for you to breathe better. Change inhaler to breo one inhalation each am everyday.  Will send in long term prescription. followup with me again in 6mos, or sooner if having breathing issues.

## 2013-06-21 NOTE — Assessment & Plan Note (Signed)
The patient's history is most suggestive of a developing COPD exacerbation. Because of the upcoming holiday, will give her a short course of prednisone and an antibiotic to treat this. I have stressed to her again that she must stop smoking in order to prevent this from occurring. The patient also has a classic summation gallop with tachycardia today, and I have sent a note to her cardiology office to reevaluate.

## 2013-06-21 NOTE — Progress Notes (Signed)
   Subjective:    Patient ID: Kathleen Burns, female    DOB: 09-Mar-1956, 57 y.o.   MRN: [id number]  HPI Patient comes in today for followup of her known COPD. Unfortunately, she continues to smoke, but it least has been compliant on her medication. Her insurance formulary has recently changed, and wishes to go back on breo.  Most recently she has noticed increasing shortness of breath, chest tightness with wheezing, and a cough but occasionally discolored mucus. She feels that she may be developing bronchitis.   Review of Systems  Constitutional: Positive for fever. Negative for unexpected weight change.  HENT: Negative for congestion, dental problem, ear pain, nosebleeds, postnasal drip, rhinorrhea, sinus pressure, sneezing, sore throat and trouble swallowing.   Eyes: Negative for redness and itching.  Respiratory: Positive for cough and shortness of breath. Negative for chest tightness and wheezing.   Cardiovascular: Positive for palpitations. Negative for leg swelling.  Gastrointestinal: Negative for nausea and vomiting.  Genitourinary: Negative for dysuria.  Musculoskeletal: Negative for joint swelling.  Skin: Negative for rash.  Neurological: Negative for headaches.  Hematological: Does not bruise/bleed easily.  Psychiatric/Behavioral: Negative for dysphoric mood. The patient is not nervous/anxious.        Objective:   Physical Exam Well-developed female in no acute distress Nose without purulence or discharge noted Neck without lymphadenopathy or thyromegaly Chest with decreased breath sounds, rhonchi but no wheezes Cardiac exam with tachycardia, prominent summation gallop Lower extremities with mild edema, no cyanosis Alert and oriented, moves all 4 extremities.       Assessment & Plan:

## 2013-06-22 DIAGNOSIS — G245 Blepharospasm: Secondary | ICD-10-CM | POA: Diagnosis not present

## 2013-07-12 ENCOUNTER — Telehealth: Payer: Self-pay | Admitting: Pulmonary Disease

## 2013-07-12 ENCOUNTER — Encounter: Payer: Self-pay | Admitting: Pulmonary Disease

## 2013-07-12 NOTE — Telephone Encounter (Signed)
lmtcb x1 

## 2013-07-12 NOTE — Telephone Encounter (Signed)
ATC home number, NA, no voicemail.  I ATC cell number, message states subscriber not available at this time. I sent an email to the pt asking that she call the office so we can get more details.  [place] Bing, CMA

## 2013-07-13 ENCOUNTER — Ambulatory Visit: Payer: Managed Care, Other (non HMO) | Admitting: Adult Health

## 2013-07-13 MED ORDER — CEFDINIR 300 MG PO CAPS: 600.0000 mg | ORAL_CAPSULE | Freq: Every day | ORAL | Status: DC

## 2013-07-13 MED ORDER — PREDNISONE 10 MG PO TABS: ORAL_TABLET | ORAL | Status: DC

## 2013-07-13 NOTE — Telephone Encounter (Signed)
Ok to call in prednisone 8 day taper and omnicef 300mg  2 each am for 5 days Also remind her that ongoing smoking interferes with her improvement, and cause recurrent flare ups.

## 2013-07-13 NOTE — Telephone Encounter (Signed)
Pt aware of recs. RX sent. Nothing further needed 

## 2013-07-13 NOTE — Telephone Encounter (Signed)
Pt sent an email yesterday but we were unable to get a hold of the pt with the # in the chart so I sent an email back asking that she call. Below is the original email: When I was last visit you gave me an antibiotic I took the antibiotic. The antibiotic worked somewhat I have more congestion in chest and cough I was wondering could [facility] I can't come in for appointments because my roommate just got out of the hospital from having a heart attack and a stint put in and can't drive for a while   Spoke with the pt and she states at last OV on 06-21-13 she was given abx and pred taper. She states while she was taking the medications she was feeling some improvement in her symptoms but not complete clearance. She states as soon as she finished the medications her symptoms became worse again. She is c/o increased SOB, chest tightness, wheezing, productive cough with yellow phlegm, hoarseness, sinus congestion. Pt states she is having difficulty using her inhalers because she cannot take a deep breath without coughing. Pt cannot come in for OV because she does nto have any transportation at this time. Please advise. [place] Bing, CMA Allergies  Allergen Reactions  . Artane [Trihexyphenidyl] Other (See Comments)    tachycardia  . Aspirin Other (See Comments)    + nonsteroidals-tachycardia  . Cogentin [Benztropine Mesylate] Other (See Comments)    Tachycardia   . Demerol Other (See Comments)    Delirium  . Inderal [Propranolol Hcl] Other (See Comments)    Psychosis  . Levofloxacin Other (See Comments)    Muscle weakness  . Paxil [Paroxetine Hcl] Other (See Comments)    Anxiety   . Procardia [Nifedipine]   . Sulfa Antibiotics   . Penicillins Rash    [facility]

## 2013-07-15 ENCOUNTER — Encounter: Payer: Self-pay | Admitting: Pulmonary Disease

## 2013-07-15 MED ORDER — FLUTICASONE FUROATE-VILANTEROL 100-25 MCG/INH IN AEPB: 1.0000 | INHALATION_SPRAY | Freq: Every day | RESPIRATORY_TRACT | Status: DC

## 2013-09-06 ENCOUNTER — Ambulatory Visit (INDEPENDENT_AMBULATORY_CARE_PROVIDER_SITE_OTHER): Payer: PRIVATE HEALTH INSURANCE | Admitting: Adult Health

## 2013-09-06 ENCOUNTER — Encounter: Payer: Self-pay | Admitting: Adult Health

## 2013-09-06 VITALS — BP 128/78 | HR 106 | Ht 66.0 in | Wt 194.0 lb

## 2013-09-06 DIAGNOSIS — I5032 Chronic diastolic (congestive) heart failure: Secondary | ICD-10-CM

## 2013-09-06 DIAGNOSIS — Z720 Tobacco use: Secondary | ICD-10-CM

## 2013-09-06 DIAGNOSIS — I509 Heart failure, unspecified: Secondary | ICD-10-CM

## 2013-09-06 DIAGNOSIS — I10 Essential (primary) hypertension: Secondary | ICD-10-CM

## 2013-09-06 DIAGNOSIS — F17200 Nicotine dependence, unspecified, uncomplicated: Secondary | ICD-10-CM

## 2013-09-06 DIAGNOSIS — E785 Hyperlipidemia, unspecified: Secondary | ICD-10-CM

## 2013-09-06 MED ORDER — AMLODIPINE BESYLATE 10 MG PO TABS: 10.0000 mg | ORAL_TABLET | Freq: Every day | ORAL | Status: DC

## 2013-09-06 MED ORDER — METOPROLOL TARTRATE 25 MG PO TABS: 12.5000 mg | ORAL_TABLET | Freq: Two times a day (BID) | ORAL | Status: DC

## 2013-09-06 NOTE — Progress Notes (Deleted)
Name: Kathleen Burns    DOB: 12/08/1955  Age: 58 y.o.  MR#: [id number]       PCP:  No primary provider on file.      Insurance: Payor: [employer] MEDICARE / Plan: [employer] / Product Type: *No Product type* /   CC:    Chief Complaint  Patient presents with  . Hypertension  . Congestive Heart Failure    VS Filed Vitals:   09/06/13 1500  BP: 128/78  Pulse: 106  Height: 5\' 6"  (1.676 m)  Weight: 194 lb (87.998 kg)    Weights Current Weight  09/06/13 194 lb (87.998 kg)  06/21/13 193 lb (87.544 kg)  11/15/12 198 lb 12.8 oz (90.175 kg)    Blood Pressure  BP Readings from Last 3 Encounters:  09/06/13 128/78  06/21/13 132/88  11/15/12 148/100     Admit date:  (Not on file) Last encounter with RMR:  Visit date not found   Allergy Artane; Aspirin; Cogentin; Demerol; Inderal; Levofloxacin; Paxil; Procardia; Sulfa antibiotics; and Penicillins  Current Outpatient Prescriptions  Medication Sig Dispense Refill  . albuterol (PROAIR HFA) 108 (90 BASE) MCG/ACT inhaler Inhale 2 puffs into the lungs 4 (four) times daily as needed. For shortness of breath       . amLODipine (NORVASC) 10 MG tablet Take 1 tablet (10 mg total) by mouth daily.  30 tablet  11  . celecoxib (CELEBREX) 100 MG capsule Take 100 mg by mouth 2 (two) times daily.      . cetirizine (ZYRTEC) 10 MG tablet Take 10 mg by mouth as needed.      . Fluticasone Furoate-Vilanterol (BREO ELLIPTA) 100-25 MCG/INH AEPB Inhale 1 puff into the lungs daily.  90 each  3  . HYDROcodone-acetaminophen (NORCO) 10-325 MG per tablet Take 1 tablet by mouth every 6 (six) hours as needed. For pain       . LORazepam (ATIVAN) 1 MG tablet Take 1 mg by mouth every 8 (eight) hours.        Marland Kitchen morphine (AVINZA) 120 MG 24 hr capsule Take 120 mg by mouth 2 (two) times daily.       . nitroGLYCERIN (NITROSTAT) 0.4 MG SL tablet Place 1 tablet (0.4 mg total) under the tongue every 5 (five) minutes as needed for chest pain.  25 tablet  3  .  omeprazole (PRILOSEC) 20 MG capsule Take 20 mg by mouth 2 (two) times daily.        . OnabotulinumtoxinA (BOTOX IJ) Inject 80 Units as directed every 6 (six) months.       Marland Kitchen UNABLE TO FIND Inject as directed every 3 (three) months. Xenomin      . zolpidem (AMBIEN CR) 12.5 MG CR tablet Take 12.5 mg by mouth at bedtime.         No current facility-administered medications for this visit.    Discontinued Meds:    Medications Discontinued During This Encounter  Medication Reason  . predniSONE (DELTASONE) 10 MG tablet Error  . cefdinir (OMNICEF) 300 MG capsule Error  . mometasone-formoterol (DULERA) 100-5 MCG/ACT AERO Error    Patient Active Problem List   Diagnosis Date Noted  . COPD exacerbation 06/21/2013  . COPD (chronic obstructive pulmonary disease) with emphysema 11/15/2012  . Chronic diastolic heart failure 24/58/0998  . DOE (dyspnea on exertion) 08/31/2012  . Pulmonary hypertension 06/17/2012  . Hyperkalemia 03/05/2012  . Chronic kidney disease 01/28/2012  . Chest pain   . Hypertension   . Meige disease (lymphedema  praecox)   . Breast cancer   . Hyperlipidemia   . Anxiety and depression   . Tobacco abuse   . Obesity     LABS    Component Value Date/Time   NA 137 04/02/2012 1636   NA 138 03/02/2012 1615   K 5.1 04/02/2012 1636   K 5.7* 03/02/2012 1615   CL 98 04/02/2012 1636   CL 106 03/02/2012 1615   CO2 28 04/02/2012 1636   CO2 27 03/02/2012 1615   GLUCOSE 90 04/02/2012 1636   GLUCOSE 93 03/02/2012 1615   BUN 22 04/02/2012 1636   BUN 21 03/02/2012 1615   CREATININE 1.09 04/02/2012 1636   CREATININE 1.00 03/02/2012 1615   CALCIUM 10.3 04/02/2012 1636   CALCIUM 10.1 03/02/2012 1615   CMP     Component Value Date/Time   NA 137 04/02/2012 1636   K 5.1 04/02/2012 1636   CL 98 04/02/2012 1636   CO2 28 04/02/2012 1636   GLUCOSE 90 04/02/2012 1636   BUN 22 04/02/2012 1636   CREATININE 1.09 04/02/2012 1636   CALCIUM 10.3 04/02/2012 1636   PROT 6.8 03/02/2012 1615   ALBUMIN 4.2 03/02/2012  1615   AST 13 03/02/2012 1615   ALT <8 03/02/2012 1615   ALKPHOS 76 03/02/2012 1615   BILITOT 0.4 03/02/2012 1615       Component Value Date/Time   WBC 8.4 03/02/2012 1615   HGB 13.6 03/02/2012 1615   HCT 40.2 03/02/2012 1615   MCV 96.9 03/02/2012 1615    Lipid Panel     Component Value Date/Time   CHOL 241* 06/16/2012 1625   TRIG 176* 06/16/2012 1625   HDL 57 06/16/2012 1625   CHOLHDL 4.2 06/16/2012 1625   VLDL 35 06/16/2012 1625   LDLCALC 149* 06/16/2012 1625    ABG    Component Value Date/Time   PHART 7.389 10/29/2012 0915   PCO2ART 37.7 10/29/2012 0915   PO2ART 53.0* 10/29/2012 0915   HCO3 25.8* 10/29/2012 0925   TCO2 27 10/29/2012 0925   ACIDBASEDEF 4.0* 10/29/2012 0919   O2SAT 50.0 10/29/2012 0925     Lab Results  Component Value Date   TSH 1.496 03/02/2012   BNP (last 3 results) No results found for this basename: PROBNP,  in the last 8760 hours Cardiac Panel (last 3 results) No results found for this basename: CKTOTAL, CKMB, TROPONINI, RELINDX,  in the last 72 hours  Iron/TIBC/Ferritin No results found for this basename: iron, tibc, ferritin     EKG Orders placed in visit on 09/06/13  . EKG 12-LEAD     Prior Assessment and Plan Problem List as of 09/06/2013     Cardiovascular and Mediastinum   Hypertension   Last Assessment & Plan   11/10/2012 Office Visit Written 11/10/2012  1:57 PM by Lendon Colonel, NP     Blood pressure is well controlled. No changes in medications.    Pulmonary hypertension   Last Assessment & Plan   11/15/2012 Office Visit Written 11/15/2012  4:49 PM by Kathee Delton, MD     The patient has mild to moderate pulmonary hypertension as I suspect is secondary to her COPD as well as diastolic dysfunction.  At this point, I have stressed the importance of total smoking cessation, staying on a bronchodilator regimen for maintenance, making sure that she is adequately saturated at night, and finally treatment of her hypertension which will lead to treatment of  her diastolic dysfunction.    Chronic diastolic heart  failure   Last Assessment & Plan   11/10/2012 Office Visit Written 11/10/2012  1:54 PM by Lendon Colonel, NP     She has no evidence of fluid overload.  Continue current medication regimen.      Respiratory   COPD (chronic obstructive pulmonary disease) with emphysema   Last Assessment & Plan   11/15/2012 Office Visit Written 11/15/2012  4:47 PM by Kathee Delton, MD     The patient has moderate COPD by her recent pulmonary function studies, and unfortunately continues to smoke.  I have stressed to her the total smoking cessation is the treatment of choice, and would also like to try and get her on a LABA/ICS for maintenance.  She was unable to tolerate symbicort, so therefore we'll try BREO.    COPD exacerbation   Last Assessment & Plan   06/21/2013 Office Visit Written 06/21/2013 11:38 AM by Kathee Delton, MD     The patient's history is most suggestive of a developing COPD exacerbation. Because of the upcoming holiday, will give her a short course of prednisone and an antibiotic to treat this. I have stressed to her again that she must stop smoking in order to prevent this from occurring. The patient also has a classic summation gallop with tachycardia today, and I have sent a note to her cardiology office to reevaluate.      Genitourinary   Chronic kidney disease   Last Assessment & Plan   03/05/2012 Office Visit Written 03/05/2012  3:44 PM by Yehuda Savannah, MD     Renal function has normalized with adjustment of antihypertensive medications.  Asymptomatic urinary tract infection will be treated with a 3 day course of antibiotics.  Organism was Escherichia coli and was pansensitive.      Other   Chest pain   Last Assessment & Plan   07/16/2012 Office Visit Written 07/16/2012  5:17 PM by Lendon Colonel, NP     Doubt cardiac etiology of chest discomfort. May be broncho spams, or esophageal spams, which she has experienced in  the past. She states that she was told she had coronary spams and had been placed on nitrates but stopped taking them. I have asked her to quit smoking and drinking so much caffeine to help with over stimulation which could possibly cause her symptoms as well. I have provided her a Rx for NTG 0.4 mg to use prn. Last cath 5 years ago is reassuring and will not pursue more testing at this time until pulmonary evaluation is complete. Anxiety can also be playing a role in her chest pain as it is relieved with use of ativan.    Meige disease (lymphedema praecox)   Breast cancer   Hyperlipidemia   Last Assessment & Plan   06/17/2012 Office Visit Written 06/17/2012  4:50 PM by Lendon Colonel, NP     Total cholesterol was 241, HDL 57, triglycerides 176, LDL 149. Will place her on Lipitor 40 mg 1 by mouth at at bedtime. Left followup lipids and LFTs in 6 weeks. She has been advised a low-cholesterol diet. With comparison to prior cholesterol studies completed in October 2013. She has had a worsening of her total cholesterol and LDL    Anxiety and depression   Tobacco abuse   Last Assessment & Plan   11/10/2012 Office Visit Written 11/10/2012  1:56 PM by Lendon Colonel, NP     Ongoing issue. She has no plans to quit. Hopefully rehab  will be helpful with this    Obesity   Hyperkalemia   Last Assessment & Plan   03/05/2012 Office Visit Written 03/12/2012  8:25 AM by Yehuda Savannah, MD     Potassium restriction will be added to her diet and chemistry profile reassessed in one month.    DOE (dyspnea on exertion)   Last Assessment & Plan   08/31/2012 Office Visit Written 08/31/2012 11:59 AM by Kathee Delton, MD     The patient has significant dyspnea on exertion that I suspect is multifactorial.  It is very likely that she has some element of COPD, and also has echocardiographic evidence for diastolic dysfunction.  She also has her elevated pulmonary artery pressures, and likely has significant  deconditioning.  I would like to check a chest x-ray today, and also scheduled for pulmonary function studies.        Imaging: No results found.

## 2013-09-06 NOTE — Assessment & Plan Note (Signed)
She continues to smoke and but is trying to quit. Using vapor cigarettes.

## 2013-09-06 NOTE — Patient Instructions (Signed)
Your physician recommends that you schedule a follow-up appointment in: 1 month with Jory Sims, NP  Your physician has recommended you make the following change in your medication:   Start Metoprolol 12.5 mg twice a day.

## 2013-09-06 NOTE — Progress Notes (Signed)
HPI: Kathleen Burns is an anxious 58 year old former patient of Dr. Lattie Haw will be assigned now to Dr. Pennelope Bracken, whereupon for ongoing assessment and management of hypertension, hyperlipidemia, chronic dyspnea on exertion, with history of COPD and ongoing tobacco abuse.  Patient had recent cardiac catheterization in May of 2014 to include right and left chamber evaluation. Coronaries were found to have mild nonobstructive CAD most dominant in the LAD with a 40% proximal lesion there and a mild RCA stenosis of 30%. She was found to have pulmonary hypertension which was proportionate to COPD. she was recommended followup with Dr. Gwenette Greet, pulmonologist. He was advised to stop smoking.  Patient was last seen in the office in May of 2014. No medications were age, she was advised to enroll in pulmonary rehabilitation through Dr. Gwenette Greet.  She comes today needing refills on amlodipine. She has complaints of racing heart rate with minimal exertion. She is easily fatigued.  Allergies  Allergen Reactions  . Artane [Trihexyphenidyl] Other (See Comments)    tachycardia  . Aspirin Other (See Comments)    + nonsteroidals-tachycardia  . Cogentin [Benztropine Mesylate] Other (See Comments)    Tachycardia   . Demerol Other (See Comments)    Delirium  . Inderal [Propranolol Hcl] Other (See Comments)    Psychosis  . Levofloxacin Other (See Comments)    Muscle weakness  . Paxil [Paroxetine Hcl] Other (See Comments)    Anxiety   . Procardia [Nifedipine]   . Sulfa Antibiotics   . Penicillins Rash    Current Outpatient Prescriptions  Medication Sig Dispense Refill  . albuterol (PROAIR HFA) 108 (90 BASE) MCG/ACT inhaler Inhale 2 puffs into the lungs 4 (four) times daily as needed. For shortness of breath       . amLODipine (NORVASC) 10 MG tablet Take 1 tablet (10 mg total) by mouth daily.  30 tablet  11  . celecoxib (CELEBREX) 100 MG capsule Take 100 mg by mouth 2 (two) times daily.      .  cetirizine (ZYRTEC) 10 MG tablet Take 10 mg by mouth as needed.      . Fluticasone Furoate-Vilanterol (BREO ELLIPTA) 100-25 MCG/INH AEPB Inhale 1 puff into the lungs daily.  90 each  3  . HYDROcodone-acetaminophen (NORCO) 10-325 MG per tablet Take 1 tablet by mouth every 6 (six) hours as needed. For pain       . LORazepam (ATIVAN) 1 MG tablet Take 1 mg by mouth every 8 (eight) hours.        Marland Kitchen morphine (AVINZA) 120 MG 24 hr capsule Take 120 mg by mouth 2 (two) times daily.       . nitroGLYCERIN (NITROSTAT) 0.4 MG SL tablet Place 1 tablet (0.4 mg total) under the tongue every 5 (five) minutes as needed for chest pain.  25 tablet  3  . omeprazole (PRILOSEC) 20 MG capsule Take 20 mg by mouth 2 (two) times daily.        . OnabotulinumtoxinA (BOTOX IJ) Inject 80 Units as directed every 6 (six) months.       Marland Kitchen UNABLE TO FIND Inject as directed every 3 (three) months. Xenomin      . zolpidem (AMBIEN CR) 12.5 MG CR tablet Take 12.5 mg by mouth at bedtime.         No current facility-administered medications for this visit.    Past Medical History  Diagnosis Date  . COPD (chronic obstructive pulmonary disease)   . Chest pain     Cardiac  catheterization in 1999, 2003, 2008-normal EF, normal coronaries  . GERD (gastroesophageal reflux disease)   . Hypertension   . Meige disease (lymphedema praecox)     blepharospasm; followed at [facility] And treated with botulinum toxin injections  . Osteoarthritis   . Breast cancer     Left mastectomy in 1990s + chemotherapy  . Hyperlipidemia   . Anxiety and depression     H/o suicide attempt  . Skull fracture 1969    surgical repair in 1969  . Hearing impairment   . Tobacco abuse     45 pack years  . Obesity     Past Surgical History  Procedure Laterality Date  . Mastectomy  1990    Left  . Back surgery  1998 and  . Eye surgery  1999, 2001  . Skull fracture elevation  1969  . Carpal tunnel release  1996  . Shoulder surgery  1996    Left  . Knee  surgery  1999    Left  . Cholecystectomy  1996  . Total abdominal hysterectomy w/ bilateral salpingoophorectomy  1994  . Tonsillectomy    . Colonoscopy  2011    No significant abnormalities    ROS: Review of systems complete and found to be negative unless listed above  PHYSICAL EXAM BP 128/78  Pulse 106  Ht 5\' 6"  (1.676 m)  Wt 194 lb (87.998 kg)  BMI 31.33 kg/m2  General: Well developed, well nourished, in no acute distress Head: Eyes PERRLA, No xanthomas.   Normal cephalic and atramatic  Lungs:  Bilateral crackles in the bases.  Heart: HRRR S1 S2, tachycardic with 1/6 systolic murmur.  Pulses are 2+ & equal. No carotid bruit. No JVD.  No abdominal bruits. No femoral bruits. Abdomen: Bowel sounds are positive, abdomen soft and non-tender without masses or  Hernia's noted. Msk:  Back normal, normal gait. Normal strength and tone for age. Extremities: No clubbing, cyanosis or edema.  DP +1 Neuro: Alert and oriented X 3. Psych:  Good affect, responds appropriately  EKG: NSR rate 98 bpm  ASSESSMENT AND PLAN

## 2013-09-06 NOTE — Assessment & Plan Note (Signed)
No evidence of fluid retention. Heart rate is elevated, usually rates of 100-120 bpm at home.  I will start her on low dose metoprolol tartrate 12.5 mg BID to see if she can tolerate this without bronchospams. She will see [name] again in one month

## 2013-09-19 ENCOUNTER — Encounter: Payer: Self-pay | Admitting: Adult Health

## 2013-10-07 ENCOUNTER — Ambulatory Visit: Payer: PRIVATE HEALTH INSURANCE | Admitting: Adult Health

## 2013-10-10 ENCOUNTER — Encounter: Payer: Self-pay | Admitting: Adult Health

## 2013-10-10 ENCOUNTER — Ambulatory Visit (INDEPENDENT_AMBULATORY_CARE_PROVIDER_SITE_OTHER): Payer: PRIVATE HEALTH INSURANCE | Admitting: Adult Health

## 2013-10-10 VITALS — BP 133/87 | HR 104 | Ht 66.0 in | Wt 186.0 lb

## 2013-10-10 DIAGNOSIS — F419 Anxiety disorder, unspecified: Secondary | ICD-10-CM

## 2013-10-10 DIAGNOSIS — F341 Dysthymic disorder: Secondary | ICD-10-CM

## 2013-10-10 DIAGNOSIS — F329 Major depressive disorder, single episode, unspecified: Secondary | ICD-10-CM

## 2013-10-10 DIAGNOSIS — I10 Essential (primary) hypertension: Secondary | ICD-10-CM

## 2013-10-10 DIAGNOSIS — Z720 Tobacco use: Secondary | ICD-10-CM

## 2013-10-10 DIAGNOSIS — I5032 Chronic diastolic (congestive) heart failure: Secondary | ICD-10-CM

## 2013-10-10 DIAGNOSIS — F17200 Nicotine dependence, unspecified, uncomplicated: Secondary | ICD-10-CM

## 2013-10-10 NOTE — Assessment & Plan Note (Signed)
Does not appear to be fluid overloaded at present. She is unable to tolerate the metoprolol due to hypotension and fatigue.

## 2013-10-10 NOTE — Progress Notes (Signed)
HPI: Kathleen Burns is an anxious 58 year old patient to be est. with Dr. Bronson Ing we are following for ongoing assessment and management of hypertension, hyperlipidemia, chronic dyspnea on exertion, with history of COPD ongoing tobacco abuse. Most recent cardiac catheterization was in May of 2014. She was found to have mild nonobstructive CAD with a dominant LAD with 40% proximal lesion and a mild RCA stenosis 30%. She was found to have pulmonary hypertension which was proportionate to her COPD. She was last seen in the office in March of 2015. She is complaining of heart racing with minimal exertion.   On last office visit, although easily anxious, she had no evidence of CHF. Her heart rate was elevated, and therefore metoprolol 12.5 mg twice a day was started. This was added to see if she can tolerate without bronchospasm. She is here on followup for evaluation of her medication tolerance.   She is unable to tolerate the metoprolol. She states she feels dizzy and light headed. She unfortunately she continues to smoke.   Allergies  Allergen Reactions  . Artane [Trihexyphenidyl] Other (See Comments)    tachycardia  . Aspirin Other (See Comments)    + nonsteroidals-tachycardia  . Cogentin [Benztropine Mesylate] Other (See Comments)    Tachycardia   . Demerol Other (See Comments)    Delirium  . Inderal [Propranolol Hcl] Other (See Comments)    Psychosis  . Levofloxacin Other (See Comments)    Muscle weakness  . Paxil [Paroxetine Hcl] Other (See Comments)    Anxiety   . Procardia [Nifedipine]   . Sulfa Antibiotics   . Penicillins Rash    Current Outpatient Prescriptions  Medication Sig Dispense Refill  . albuterol (PROAIR HFA) 108 (90 BASE) MCG/ACT inhaler Inhale 2 puffs into the lungs 4 (four) times daily as needed. For shortness of breath       . amLODipine (NORVASC) 10 MG tablet Take 1 tablet (10 mg total) by mouth daily.  90 tablet  3  . celecoxib (CELEBREX) 100 MG capsule Take  100 mg by mouth 2 (two) times daily.      . cetirizine (ZYRTEC) 10 MG tablet Take 10 mg by mouth as needed.      . Fluticasone Furoate-Vilanterol (BREO ELLIPTA) 100-25 MCG/INH AEPB Inhale 1 puff into the lungs daily.  90 each  3  . HYDROcodone-acetaminophen (NORCO) 10-325 MG per tablet Take 1 tablet by mouth every 6 (six) hours as needed. For pain       . LORazepam (ATIVAN) 1 MG tablet Take 1 mg by mouth every 8 (eight) hours.        . metoprolol tartrate (LOPRESSOR) 25 MG tablet Take 0.5 tablets (12.5 mg total) by mouth 2 (two) times daily.  30 tablet  0  . morphine (AVINZA) 120 MG 24 hr capsule Take 120 mg by mouth 2 (two) times daily.       . nitroGLYCERIN (NITROSTAT) 0.4 MG SL tablet Place 1 tablet (0.4 mg total) under the tongue every 5 (five) minutes as needed for chest pain.  25 tablet  3  . omeprazole (PRILOSEC) 20 MG capsule Take 20 mg by mouth 2 (two) times daily.        . OnabotulinumtoxinA (BOTOX IJ) Inject 80 Units as directed every 6 (six) months.       Marland Kitchen UNABLE TO FIND Inject as directed every 3 (three) months. Xenomin      . zolpidem (AMBIEN CR) 12.5 MG CR tablet Take 12.5 mg by mouth  at bedtime.         No current facility-administered medications for this visit.    Past Medical History  Diagnosis Date  . COPD (chronic obstructive pulmonary disease)   . Chest pain     Cardiac catheterization in 1999, 2003, 2008-normal EF, normal coronaries  . GERD (gastroesophageal reflux disease)   . Hypertension   . Meige disease (lymphedema praecox)     blepharospasm; followed at [facility] And treated with botulinum toxin injections  . Osteoarthritis   . Breast cancer     Left mastectomy in 1990s + chemotherapy  . Hyperlipidemia   . Anxiety and depression     H/o suicide attempt  . Skull fracture 1969    surgical repair in 1969  . Hearing impairment   . Tobacco abuse     45 pack years  . Obesity     Past Surgical History  Procedure Laterality Date  . Mastectomy  1990    Left    . Back surgery  1998 and  . Eye surgery  1999, 2001  . Skull fracture elevation  1969  . Carpal tunnel release  1996  . Shoulder surgery  1996    Left  . Knee surgery  1999    Left  . Cholecystectomy  1996  . Total abdominal hysterectomy w/ bilateral salpingoophorectomy  1994  . Tonsillectomy    . Colonoscopy  2011    No significant abnormalities    ROS: Review of systems complete and found to be negative unless listed above  PHYSICAL EXAM BP 133/87  Pulse 104  Ht 5\' 6"  (1.676 m)  Wt 186 lb (84.369 kg)  BMI 30.04 kg/m2  SpO2 94% General: Well developed, well nourished, in no acute distress, smells heavily of cigarettes.  Head: Eyes PERRLA, No xanthomas.   Normal cephalic and atramatic  Lungs: Clear bilaterally to auscultation and percussion. Heart: HRRR S1 S2, without MRG.  Pulses are 2+ & equal.            No carotid bruit. No JVD.  No abdominal bruits. No femoral bruits. Abdomen: Bowel sounds are positive, abdomen soft and non-tender without masses or                  Hernia's noted. Msk:  Back normal, normal gait. Normal strength and tone for age. Extremities: No clubbing, cyanosis or edema.  DP +1 Neuro: Alert and oriented X 3. Psych:  Tearful affect, responds appropriately     ASSESSMENT AND PLAN

## 2013-10-10 NOTE — Patient Instructions (Signed)
Your physician recommends that you schedule a follow-up appointment in: 6 months with Dr Virgina Jock will receive a reminder letter two months in advance reminding you to call and schedule your appointment. If you don't receive this letter, please contact our office.  Your physician has recommended you make the following change in your medication:  Stop Metoprolol

## 2013-10-10 NOTE — Assessment & Plan Note (Addendum)
Well controlled currently after taking herself off of the metoprolol.She is to continue amlodipine as directed. Heart rate is still elevated when she stopped the metoprolol, but better on it. She states that she feels too sluggish and light headed when taking it. I believe that some of this is the nicotine use contributing to HR along with her COPD. Will see her again in 6 months.

## 2013-10-10 NOTE — Assessment & Plan Note (Signed)
She is to follow with PCP for this.

## 2013-10-10 NOTE — Assessment & Plan Note (Signed)
Despite lengthy and numerous conversations on cessation, she will not quit smoking. She is aware of the risks and it upsets her, but she still continues to smoke.  I have wished her well with this, but continue my recommendations.

## 2013-10-10 NOTE — Progress Notes (Deleted)
Name: Kathleen Burns    DOB: 24-Mar-1956  Age: 58 y.o.  MR#: [id number]       PCP:  No primary provider on file.      Insurance: Payor: Holley Bouche MEDICARE / Plan: [employer] / Product Type: *No Product type* /   CC:    Chief Complaint  Patient presents with  . Hypertension  . Congestive Heart Failure    VS Filed Vitals:   10/10/13 1316  BP: 133/87  Pulse: 104  Height: 5\' 6"  (1.676 m)  Weight: 186 lb (84.369 kg)  SpO2: 94%    Weights Current Weight  10/10/13 186 lb (84.369 kg)  09/06/13 194 lb (87.998 kg)  06/21/13 193 lb (87.544 kg)    Blood Pressure  BP Readings from Last 3 Encounters:  10/10/13 133/87  09/06/13 128/78  06/21/13 132/88     Admit date:  (Not on file) Last encounter with RMR:  09/06/2013   Allergy Artane; Aspirin; Cogentin; Demerol; Inderal; Levofloxacin; Paxil; Procardia; Sulfa antibiotics; and Penicillins  Current Outpatient Prescriptions  Medication Sig Dispense Refill  . albuterol (PROAIR HFA) 108 (90 BASE) MCG/ACT inhaler Inhale 2 puffs into the lungs 4 (four) times daily as needed. For shortness of breath       . amLODipine (NORVASC) 10 MG tablet Take 1 tablet (10 mg total) by mouth daily.  90 tablet  3  . celecoxib (CELEBREX) 100 MG capsule Take 100 mg by mouth 2 (two) times daily.      . cetirizine (ZYRTEC) 10 MG tablet Take 10 mg by mouth as needed.      . Fluticasone Furoate-Vilanterol (BREO ELLIPTA) 100-25 MCG/INH AEPB Inhale 1 puff into the lungs daily.  90 each  3  . HYDROcodone-acetaminophen (NORCO) 10-325 MG per tablet Take 1 tablet by mouth every 6 (six) hours as needed. For pain       . LORazepam (ATIVAN) 1 MG tablet Take 1 mg by mouth every 8 (eight) hours.        . metoprolol tartrate (LOPRESSOR) 25 MG tablet Take 0.5 tablets (12.5 mg total) by mouth 2 (two) times daily.  30 tablet  0  . morphine (AVINZA) 120 MG 24 hr capsule Take 120 mg by mouth 2 (two) times daily.       . nitroGLYCERIN (NITROSTAT) 0.4 MG SL tablet Place  1 tablet (0.4 mg total) under the tongue every 5 (five) minutes as needed for chest pain.  25 tablet  3  . omeprazole (PRILOSEC) 20 MG capsule Take 20 mg by mouth 2 (two) times daily.        . OnabotulinumtoxinA (BOTOX IJ) Inject 80 Units as directed every 6 (six) months.       Marland Kitchen UNABLE TO FIND Inject as directed every 3 (three) months. Xenomin      . zolpidem (AMBIEN CR) 12.5 MG CR tablet Take 12.5 mg by mouth at bedtime.         No current facility-administered medications for this visit.    Discontinued Meds:   There are no discontinued medications.  Patient Active Problem List   Diagnosis Date Noted  . COPD exacerbation 06/21/2013  . COPD (chronic obstructive pulmonary disease) with emphysema 11/15/2012  . Chronic diastolic heart failure 62/95/2841  . DOE (dyspnea on exertion) 08/31/2012  . Pulmonary hypertension 06/17/2012  . Hyperkalemia 03/05/2012  . Chronic kidney disease 01/28/2012  . Chest pain   . Hypertension   . Meige disease (lymphedema praecox)   . Breast cancer   .  Hyperlipidemia   . Anxiety and depression   . Tobacco abuse   . Obesity     LABS    Component Value Date/Time   NA 137 04/02/2012 1636   NA 138 03/02/2012 1615   K 5.1 04/02/2012 1636   K 5.7* 03/02/2012 1615   CL 98 04/02/2012 1636   CL 106 03/02/2012 1615   CO2 28 04/02/2012 1636   CO2 27 03/02/2012 1615   GLUCOSE 90 04/02/2012 1636   GLUCOSE 93 03/02/2012 1615   BUN 22 04/02/2012 1636   BUN 21 03/02/2012 1615   CREATININE 1.09 04/02/2012 1636   CREATININE 1.00 03/02/2012 1615   CALCIUM 10.3 04/02/2012 1636   CALCIUM 10.1 03/02/2012 1615   CMP     Component Value Date/Time   NA 137 04/02/2012 1636   K 5.1 04/02/2012 1636   CL 98 04/02/2012 1636   CO2 28 04/02/2012 1636   GLUCOSE 90 04/02/2012 1636   BUN 22 04/02/2012 1636   CREATININE 1.09 04/02/2012 1636   CALCIUM 10.3 04/02/2012 1636   PROT 6.8 03/02/2012 1615   ALBUMIN 4.2 03/02/2012 1615   AST 13 03/02/2012 1615   ALT <8 03/02/2012 1615   ALKPHOS 76 03/02/2012  1615   BILITOT 0.4 03/02/2012 1615       Component Value Date/Time   WBC 8.4 03/02/2012 1615   HGB 13.6 03/02/2012 1615   HCT 40.2 03/02/2012 1615   MCV 96.9 03/02/2012 1615    Lipid Panel     Component Value Date/Time   CHOL 241* 06/16/2012 1625   TRIG 176* 06/16/2012 1625   HDL 57 06/16/2012 1625   CHOLHDL 4.2 06/16/2012 1625   VLDL 35 06/16/2012 1625   LDLCALC 149* 06/16/2012 1625    ABG    Component Value Date/Time   PHART 7.389 10/29/2012 0915   PCO2ART 37.7 10/29/2012 0915   PO2ART 53.0* 10/29/2012 0915   HCO3 25.8* 10/29/2012 0925   TCO2 27 10/29/2012 0925   ACIDBASEDEF 4.0* 10/29/2012 0919   O2SAT 50.0 10/29/2012 0925     Lab Results  Component Value Date   TSH 1.496 03/02/2012   BNP (last 3 results) No results found for this basename: PROBNP,  in the last 8760 hours Cardiac Panel (last 3 results) No results found for this basename: CKTOTAL, CKMB, TROPONINI, RELINDX,  in the last 72 hours  Iron/TIBC/Ferritin No results found for this basename: iron, tibc, ferritin     EKG Orders placed in visit on 09/06/13  . EKG 12-LEAD     Prior Assessment and Plan Problem List as of 10/10/2013     Cardiovascular and Mediastinum   Hypertension   Last Assessment & Plan   11/10/2012 Office Visit Written 11/10/2012  1:57 PM by Lendon Colonel, NP     Blood pressure is well controlled. No changes in medications.    Pulmonary hypertension   Last Assessment & Plan   11/15/2012 Office Visit Written 11/15/2012  4:49 PM by Kathee Delton, MD     The patient has mild to moderate pulmonary hypertension as I suspect is secondary to her COPD as well as diastolic dysfunction.  At this point, I have stressed the importance of total smoking cessation, staying on a bronchodilator regimen for maintenance, making sure that she is adequately saturated at night, and finally treatment of her hypertension which will lead to treatment of her diastolic dysfunction.    Chronic diastolic heart failure   Last  Assessment & Plan  09/06/2013 Office Visit Written 09/06/2013  3:41 PM by Lendon Colonel, NP     No evidence of fluid retention. Heart rate is elevated, usually rates of 100-120 bpm at home.  I will start her on low dose metoprolol tartrate 12.5 mg BID to see if she can tolerate this without bronchospams. She will see [name] again in one month      Respiratory   COPD (chronic obstructive pulmonary disease) with emphysema   Last Assessment & Plan   11/15/2012 Office Visit Written 11/15/2012  4:47 PM by Kathee Delton, MD     The patient has moderate COPD by her recent pulmonary function studies, and unfortunately continues to smoke.  I have stressed to her the total smoking cessation is the treatment of choice, and would also like to try and get her on a LABA/ICS for maintenance.  She was unable to tolerate symbicort, so therefore we'll try BREO.    COPD exacerbation   Last Assessment & Plan   06/21/2013 Office Visit Written 06/21/2013 11:38 AM by Kathee Delton, MD     The patient's history is most suggestive of a developing COPD exacerbation. Because of the upcoming holiday, will give her a short course of prednisone and an antibiotic to treat this. I have stressed to her again that she must stop smoking in order to prevent this from occurring. The patient also has a classic summation gallop with tachycardia today, and I have sent a note to her cardiology office to reevaluate.      Genitourinary   Chronic kidney disease   Last Assessment & Plan   03/05/2012 Office Visit Written 03/05/2012  3:44 PM by Yehuda Savannah, MD     Renal function has normalized with adjustment of antihypertensive medications.  Asymptomatic urinary tract infection will be treated with a 3 day course of antibiotics.  Organism was Escherichia coli and was pansensitive.      Other   Chest pain   Last Assessment & Plan   07/16/2012 Office Visit Written 07/16/2012  5:17 PM by Lendon Colonel, NP     Doubt cardiac etiology  of chest discomfort. May be broncho spams, or esophageal spams, which she has experienced in the past. She states that she was told she had coronary spams and had been placed on nitrates but stopped taking them. I have asked her to quit smoking and drinking so much caffeine to help with over stimulation which could possibly cause her symptoms as well. I have provided her a Rx for NTG 0.4 mg to use prn. Last cath 5 years ago is reassuring and will not pursue more testing at this time until pulmonary evaluation is complete. Anxiety can also be playing a role in her chest pain as it is relieved with use of ativan.    Meige disease (lymphedema praecox)   Breast cancer   Hyperlipidemia   Last Assessment & Plan   06/17/2012 Office Visit Written 06/17/2012  4:50 PM by Lendon Colonel, NP     Total cholesterol was 241, HDL 57, triglycerides 176, LDL 149. Will place her on Lipitor 40 mg 1 by mouth at at bedtime. Left followup lipids and LFTs in 6 weeks. She has been advised a low-cholesterol diet. With comparison to prior cholesterol studies completed in October 2013. She has had a worsening of her total cholesterol and LDL    Anxiety and depression   Tobacco abuse   Last Assessment & Plan   09/06/2013 Office  Visit Written 09/06/2013  3:39 PM by Lendon Colonel, NP     She continues to smoke and but is trying to quit. Using vapor cigarettes.     Obesity   Hyperkalemia   Last Assessment & Plan   03/05/2012 Office Visit Written 03/12/2012  8:25 AM by Yehuda Savannah, MD     Potassium restriction will be added to her diet and chemistry profile reassessed in one month.    DOE (dyspnea on exertion)   Last Assessment & Plan   08/31/2012 Office Visit Written 08/31/2012 11:59 AM by Kathee Delton, MD     The patient has significant dyspnea on exertion that I suspect is multifactorial.  It is very likely that she has some element of COPD, and also has echocardiographic evidence for diastolic dysfunction.  She  also has her elevated pulmonary artery pressures, and likely has significant deconditioning.  I would like to check a chest x-ray today, and also scheduled for pulmonary function studies.        Imaging: No results found.

## 2013-10-12 ENCOUNTER — Telehealth: Payer: Self-pay

## 2013-10-12 NOTE — Telephone Encounter (Signed)
Kathleen Burns had completed a patient satisfaction survey form after her office visit from 10/10/13  She was concerned that all of her questions were not answered at the time of visit. ie what can I do for my fast heart rate. I suggested she avoid caffeine products as she indicated she drinks quite a bit of soda daily. She also was concerned over a lab test her pcp Dr.Anthony Alford Highland had ordered;which showed an elevated potassium which when re-drawn was normal. She questioned was there anything further she needed to do and I encouraged her to speak with her pcp.  I told her that our role as nurses in the clinic is to be a conduit between the patient and the providers.I suggested she call us anytime if she had further questions and we (nursing staff) would br happy to provide assistance with her medical questions   Ms.Hemrick seemed satisfied

## 2013-10-12 NOTE — Telephone Encounter (Signed)
Patient asleep,told family I will call back at lunch time or after 4 pm when clinic is finished

## 2013-12-01 DIAGNOSIS — I10 Essential (primary) hypertension: Secondary | ICD-10-CM | POA: Diagnosis not present

## 2013-12-20 ENCOUNTER — Ambulatory Visit: Payer: Managed Care, Other (non HMO) | Admitting: Pulmonary Disease

## 2014-01-04 ENCOUNTER — Ambulatory Visit (INDEPENDENT_AMBULATORY_CARE_PROVIDER_SITE_OTHER): Payer: PRIVATE HEALTH INSURANCE | Admitting: Pulmonary Disease

## 2014-01-04 ENCOUNTER — Encounter: Payer: Self-pay | Admitting: Pulmonary Disease

## 2014-01-04 VITALS — BP 140/98 | HR 112 | Temp 98.2°F | Ht 66.0 in | Wt 185.2 lb

## 2014-01-04 DIAGNOSIS — J438 Other emphysema: Secondary | ICD-10-CM

## 2014-01-04 NOTE — Progress Notes (Signed)
   Subjective:    Patient ID: Kathleen Burns, female    DOB: 1955/11/30, 58 y.o.   MRN: [id number]  HPI Patient comes in today for followup of her known mild to moderate COPD. She is staying on her bronchodilator regimen, and tells me that she is not overusing her rescue inhaler. Unfortunately she continues to smoke, and this can lead to increasing coughing. She currently does not have chest congestion or purulent mucus. She believes that her exertional tolerance is at baseline since the last visit, and has not had a pulmonary infection or acute exacerbation.   Review of Systems  Constitutional: Negative for fever and unexpected weight change.  HENT: Negative for congestion, dental problem, ear pain, nosebleeds, postnasal drip, rhinorrhea, sinus pressure, sneezing, sore throat and trouble swallowing.   Eyes: Negative for redness and itching.  Respiratory: Positive for cough and shortness of breath. Negative for chest tightness and wheezing.   Cardiovascular: Negative for palpitations and leg swelling.  Gastrointestinal: Negative for nausea and vomiting.  Genitourinary: Negative for dysuria.  Musculoskeletal: Negative for joint swelling.  Skin: Negative for rash.  Neurological: Negative for headaches.  Hematological: Does not bruise/bleed easily.  Psychiatric/Behavioral: Negative for dysphoric mood. The patient is not nervous/anxious.        Objective:   Physical Exam Well-developed female in no acute distress Nose without purulence or discharge noted Neck without lymphadenopathy or thyromegaly Chest with diminished breath sounds throughout, no active wheezing Cardiac exam with a mild regular tachycardia, but very prominent summation gallop. Lower extremities with mild edema, no cyanosis Alert and oriented, moves all 4 extremities.       Assessment & Plan:

## 2014-01-04 NOTE — Patient Instructions (Signed)
Continue on your Breo, along with rescue inhaler.  If you wish to try additional medication, would try spiriva samples.  Just let me know Work on smoking cessation. followup with me again in 38mos.

## 2014-01-04 NOTE — Assessment & Plan Note (Signed)
The patient appears to be at a stable baseline from a COPD standpoint. However, she continues to smoke, which then leads to increased airway inflammation with cough and dyspnea. I've asked her to stay on breo for now, and to try her best to work on 100% smoking cessation.

## 2014-03-13 ENCOUNTER — Encounter: Payer: Self-pay | Admitting: Pulmonary Disease

## 2014-03-14 ENCOUNTER — Encounter: Payer: Self-pay | Admitting: Pulmonary Disease

## 2014-03-14 NOTE — Telephone Encounter (Signed)
Dr Gwenette Greet,  Patient is requesting an order for O2 to be D/C and concentrator to be picked up from her home.  Please advise. Thanks.

## 2014-03-15 ENCOUNTER — Telehealth: Payer: Self-pay | Admitting: Pulmonary Disease

## 2014-03-15 NOTE — Telephone Encounter (Signed)
Spoke with patient--states that she will contact her insurance company to see what is covered. Pt will contact our office back once she speaks with insurance.

## 2014-03-15 NOTE — Telephone Encounter (Signed)
Please advise KC thanks 

## 2014-03-15 NOTE — Telephone Encounter (Signed)
Let pt know that we do not typically use a portable concentrator during sleep because pt's need continuous oxygen during sleep and not pulsed.  Also, would overwork a small device and wear it out faster. I am happy to order for her a POC to use during day on pulsed setting.

## 2014-03-16 ENCOUNTER — Encounter: Payer: Self-pay | Admitting: Pulmonary Disease

## 2014-03-21 NOTE — Telephone Encounter (Signed)
Pt is wanting to get POC. Please advise settings and can send in order thanks

## 2014-03-29 ENCOUNTER — Telehealth: Payer: Self-pay | Admitting: Pulmonary Disease

## 2014-03-29 DIAGNOSIS — I272 Pulmonary hypertension, unspecified: Secondary | ICD-10-CM

## 2014-03-29 DIAGNOSIS — J438 Other emphysema: Secondary | ICD-10-CM

## 2014-03-29 NOTE — Telephone Encounter (Signed)
If pt is not using oxygen and wants it picked up, ok to send d/c order.

## 2014-03-29 NOTE — Telephone Encounter (Signed)
This has been turned into a phone note so will close out the email

## 2014-03-29 NOTE — Telephone Encounter (Signed)
Pt states that she is currently only using oxygen at night but doesn't even use it that often because it is hard to handle the big concentrator because she doesn't have a lot of room and then has to deal with the longer cord.  Pt states that her insurance will only cover 70/30 on a POC and she cannot afford that.  Pt is requesting that all oxygen be picked up for now because she isn't really using it.  Please advise.

## 2014-03-29 NOTE — Telephone Encounter (Signed)
Pt email sent to pt. Order placed

## 2014-03-29 NOTE — Telephone Encounter (Signed)
Order has been sent to DME

## 2014-03-29 NOTE — Telephone Encounter (Signed)
I have not put in order yet.  Have we ever documented desats with exertion? Or do we just have her on nocturnal oxygen?  Can't get POC unless she needs exertional oxygen.  If this has not been done, needs to come in for ambulatory sats on room air.

## 2014-05-01 ENCOUNTER — Encounter: Payer: Self-pay | Admitting: Cardiovascular Disease

## 2014-05-01 ENCOUNTER — Ambulatory Visit (INDEPENDENT_AMBULATORY_CARE_PROVIDER_SITE_OTHER): Payer: PRIVATE HEALTH INSURANCE | Admitting: Cardiovascular Disease

## 2014-05-01 VITALS — BP 150/92 | HR 86 | Ht 66.0 in | Wt 186.0 lb

## 2014-05-01 DIAGNOSIS — I270 Primary pulmonary hypertension: Secondary | ICD-10-CM

## 2014-05-01 DIAGNOSIS — I5032 Chronic diastolic (congestive) heart failure: Secondary | ICD-10-CM

## 2014-05-01 DIAGNOSIS — E785 Hyperlipidemia, unspecified: Secondary | ICD-10-CM

## 2014-05-01 DIAGNOSIS — R079 Chest pain, unspecified: Secondary | ICD-10-CM

## 2014-05-01 DIAGNOSIS — Z716 Tobacco abuse counseling: Secondary | ICD-10-CM

## 2014-05-01 DIAGNOSIS — I25118 Atherosclerotic heart disease of native coronary artery with other forms of angina pectoris: Secondary | ICD-10-CM

## 2014-05-01 DIAGNOSIS — I10 Essential (primary) hypertension: Secondary | ICD-10-CM

## 2014-05-01 DIAGNOSIS — I509 Heart failure, unspecified: Secondary | ICD-10-CM

## 2014-05-01 DIAGNOSIS — I50810 Right heart failure, unspecified: Secondary | ICD-10-CM

## 2014-05-01 DIAGNOSIS — I272 Pulmonary hypertension, unspecified: Secondary | ICD-10-CM

## 2014-05-01 MED ORDER — RANOLAZINE ER 500 MG PO TB12: 500.0000 mg | ORAL_TABLET | Freq: Two times a day (BID) | ORAL | Status: DC

## 2014-05-01 NOTE — Progress Notes (Signed)
Patient ID: Kathleen Burns, female   DOB: July 25, 1955, 58 y.o.   MRN: [id number]      SUBJECTIVE: The patient is a 58 year old woman with a history of hypertension, hyperlipidemia, anxiety, COPD, tobacco abuse with chronic dyspnea, and nonobstructive coronary artery disease. She has a history of palpitations and metoprolol was previously attempted but she did not tolerate it, causing her to experience dizziness and lightheadedness. She has chest pain at times with exertion which is relieved with rest. Her blood pressures at home have been normal with occasional high readings. There have been occasional systolic readings in the 90 mmHg range, at which time she feels lightheaded and dizzy. She denies syncope. She continues to smoke 2 packs of cigarettes daily. Echocardiogram on 05/26/2012 demonstrated normal left ventricular systolic function with mildly reduced right ventricular systolic function and severely elevated pulmonary pressures, 64 mmHg.  Review of Systems: As per "subjective", otherwise negative.  Allergies  Allergen Reactions  . Artane [Trihexyphenidyl] Other (See Comments)    tachycardia  . Aspirin Other (See Comments)    + nonsteroidals-tachycardia  . Cogentin [Benztropine Mesylate] Other (See Comments)    Tachycardia   . Demerol Other (See Comments)    Delirium  . Inderal [Propranolol Hcl] Other (See Comments)    Psychosis  . Levofloxacin Other (See Comments)    Muscle weakness  . Paxil [Paroxetine Hcl] Other (See Comments)    Anxiety   . Procardia [Nifedipine]   . Sulfa Antibiotics   . Penicillins Rash    Current Outpatient Prescriptions  Medication Sig Dispense Refill  . albuterol (PROAIR HFA) 108 (90 BASE) MCG/ACT inhaler Inhale 2 puffs into the lungs 4 (four) times daily as needed. For shortness of breath     . amLODipine (NORVASC) 10 MG tablet Take 1 tablet (10 mg total) by mouth daily. 90 tablet 3  . cetirizine (ZYRTEC) 10 MG tablet Take 10 mg by mouth as  needed.    . Fluticasone Furoate-Vilanterol (BREO ELLIPTA) 100-25 MCG/INH AEPB Inhale 1 puff into the lungs daily. 90 each 3  . HYDROcodone-acetaminophen (NORCO) 10-325 MG per tablet Take 1 tablet by mouth every 6 (six) hours as needed. For pain     . LORazepam (ATIVAN) 1 MG tablet Take 1 mg by mouth every 8 (eight) hours.      . montelukast (SINGULAIR) 10 MG tablet Take 10 mg by mouth at bedtime.    Marland Kitchen morphine (AVINZA) 120 MG 24 hr capsule Take 120 mg by mouth 2 (two) times daily.     . nitroGLYCERIN (NITROSTAT) 0.4 MG SL tablet Place 1 tablet (0.4 mg total) under the tongue every 5 (five) minutes as needed for chest pain. 25 tablet 3  . omeprazole (PRILOSEC) 20 MG capsule Take 20 mg by mouth 2 (two) times daily.      . OnabotulinumtoxinA (BOTOX IJ) Inject 80 Units as directed every 6 (six) months.     . zolpidem (AMBIEN CR) 12.5 MG CR tablet Take 12.5 mg by mouth at bedtime.       No current facility-administered medications for this visit.    Past Medical History  Diagnosis Date  . COPD (chronic obstructive pulmonary disease)   . Chest pain     Cardiac catheterization in 1999, 2003, 2008-normal EF, normal coronaries  . GERD (gastroesophageal reflux disease)   . Hypertension   . Meige disease (lymphedema praecox)     blepharospasm; followed at [facility] And treated with botulinum toxin injections  . Osteoarthritis   . Breast  cancer     Left mastectomy in 1990s + chemotherapy  . Hyperlipidemia   . Anxiety and depression     H/o suicide attempt  . Skull fracture 1969    surgical repair in 1969  . Hearing impairment   . Tobacco abuse     45 pack years  . Obesity     Past Surgical History  Procedure Laterality Date  . Mastectomy  1990    Left  . Back surgery  1998 and  . Eye surgery  1999, 2001  . Skull fracture elevation  1969  . Carpal tunnel release  1996  . Shoulder surgery  1996    Left  . Knee surgery  1999    Left  . Cholecystectomy  1996  . Total abdominal  hysterectomy w/ bilateral salpingoophorectomy  1994  . Tonsillectomy    . Colonoscopy  2011    No significant abnormalities    History   Social History  . Marital Status: Single    Spouse Name: N/A    Number of Children: 0  . Years of Education: N/A   Occupational History  . Disabled     previously case Freight forwarder with [employer]  . Smoking status: Current Every Day Smoker -- 1.50 packs/day for 30 years    Types: Cigarettes  . Smokeless tobacco: Never Used  . Alcohol Use: No  . Drug Use: No     Comment: remote marijuana--QUIT SMOKING IN 1990's  . Sexual Activity: Not on file   Other Topics Concern  . Not on file   Social History Narrative     Filed Vitals:   05/01/14 1600  BP: 150/92  Pulse: 86  Height: 5\' 6"  (1.676 m)  Weight: 186 lb (84.369 kg)    PHYSICAL EXAM General: NAD HEENT: Normal. Neck: No JVD, no thyromegaly. Lungs: Diminished air entry b/l, no rales or wheezes. CV: Nondisplaced PMI.  Regular rate and rhythm, normal S1/S2, no S3/S4, no murmur. No pretibial or periankle edema.  No carotid bruit.   Abdomen: Soft,no distention.  Neurologic: Alert and oriented.  Psych: Normal affect. Skin: Normal. Musculoskeletal: Normal range of motion, no gross deformities. Extremities: No clubbing or cyanosis.   ECG: Most recent ECG reviewed.    ASSESSMENT AND PLAN: 1. CAD with exertional angina: Stable nonobstructive CAD. Given chest pains with exertion, will try Ranexa 500 mg bid. Would not use nitrates so as not to lower BP. Effects of tobacco abuse on coronary microcirculation explained in detail. 2. Essential HTN: Mostly controlled at home, and pt reports white coat HTN. No changes to therapy. If BP is low, she has been instructed to take half dose of amlodipine (normally takes 10 mg daily). 3. Tobacco abuse: Cessation counseling provided. 4. COPD: 58 with pulmonary. Stable on present regimen. Tobacco cessation counseling  provided. 5. Pulmonary hypertension with reduced RV function: Secondary to COPD. Stable, no lower extremity edema.  Dispo: f/u 6 months.  Kate Sable, M.D., F.A.C.C.

## 2014-05-01 NOTE — Patient Instructions (Addendum)
Your physician wants you to follow-up in: 6 months  You will receive a reminder letter in the mail two months in advance. If you don't receive a letter, please call our office to schedule the follow-up appointment.       Please take Ranexa 500 mg twice a day      Thank you for choosing [facility] !

## 2014-07-07 ENCOUNTER — Ambulatory Visit: Payer: PRIVATE HEALTH INSURANCE | Admitting: Pulmonary Disease

## 2014-07-24 ENCOUNTER — Ambulatory Visit: Payer: PRIVATE HEALTH INSURANCE | Admitting: Pulmonary Disease

## 2014-08-04 ENCOUNTER — Encounter: Payer: Self-pay | Admitting: Cardiovascular Disease

## 2014-08-04 ENCOUNTER — Telehealth: Payer: Self-pay | Admitting: *Deleted

## 2014-08-04 DIAGNOSIS — I10 Essential (primary) hypertension: Secondary | ICD-10-CM

## 2014-08-04 DIAGNOSIS — E785 Hyperlipidemia, unspecified: Secondary | ICD-10-CM

## 2014-08-04 MED ORDER — AMLODIPINE BESYLATE 10 MG PO TABS: 10.0000 mg | ORAL_TABLET | Freq: Every day | ORAL | Status: DC

## 2014-08-04 NOTE — Telephone Encounter (Signed)
Per pt request amlodipine sent to [facility]

## 2014-08-15 ENCOUNTER — Ambulatory Visit (INDEPENDENT_AMBULATORY_CARE_PROVIDER_SITE_OTHER)
Admission: RE | Admit: 2014-08-15 | Discharge: 2014-08-15 | Disposition: A | Discharge status: Home / Self Care | Payer: PRIVATE HEALTH INSURANCE | Source: Ambulatory Visit | Attending: Pulmonary Disease | Admitting: Pulmonary Disease

## 2014-08-15 ENCOUNTER — Encounter: Payer: Self-pay | Admitting: Pulmonary Disease

## 2014-08-15 ENCOUNTER — Ambulatory Visit (INDEPENDENT_AMBULATORY_CARE_PROVIDER_SITE_OTHER): Payer: PRIVATE HEALTH INSURANCE | Admitting: Pulmonary Disease

## 2014-08-15 VITALS — BP 130/82 | HR 105 | Temp 97.6°F | Ht 66.0 in | Wt 185.6 lb

## 2014-08-15 DIAGNOSIS — J441 Chronic obstructive pulmonary disease with (acute) exacerbation: Secondary | ICD-10-CM

## 2014-08-15 DIAGNOSIS — J438 Other emphysema: Secondary | ICD-10-CM

## 2014-08-15 MED ORDER — TIOTROPIUM BROMIDE MONOHYDRATE 2.5 MCG/ACT IN AERS: 2.0000 | INHALATION_SPRAY | RESPIRATORY_TRACT | Status: DC

## 2014-08-15 MED ORDER — FLUTICASONE FUROATE-VILANTEROL 100-25 MCG/INH IN AEPB: 1.0000 | INHALATION_SPRAY | Freq: Every day | RESPIRATORY_TRACT | Status: DC

## 2014-08-15 MED ORDER — PREDNISONE 10 MG PO TABS: ORAL_TABLET | ORAL | Status: DC

## 2014-08-15 NOTE — Patient Instructions (Signed)
Stay on breo one inhalation each am Will start on spiriva respimat, 2 inhalations each am everyday Will treat with a course of prednisone to get you thru this episode. Stop smoking. followup with me again in 34mos.

## 2014-08-15 NOTE — Progress Notes (Signed)
   Subjective:    Patient ID: Kathleen Burns, female    DOB: [birth year], 59 y.o.   MRN: [id number]  HPI Patient comes in today for follow-up, but is having an acute flare. She has known COPD, and unfortunately continues to smoke. She developed an upper respiratory infection at the end of last year, and states that she has never totally gotten over the event. She has had persistent chest congestion, rattling, and wheezing with increased shortness of breath. He has increased cough, but is not bringing up purulent mucus. She is staying on her LABA/ICS, but does not feel that it is doing the job. She has tried [place] and Symbicort in the past and felt did not work for her at all. She has been on nocturnal oxygen, however sent it back to the home care company and did not want to wear.   Review of Systems  Constitutional: Negative for fever and unexpected weight change.  HENT: Positive for congestion, postnasal drip and rhinorrhea. Negative for dental problem, ear pain, nosebleeds, sinus pressure, sneezing, sore throat and trouble swallowing.   Eyes: Negative for redness and itching.  Respiratory: Positive for cough, chest tightness, shortness of breath and wheezing.   Cardiovascular: Negative for palpitations and leg swelling.  Gastrointestinal: Negative for nausea and vomiting.  Genitourinary: Negative for dysuria.  Musculoskeletal: Negative for joint swelling.  Skin: Negative for rash.  Neurological: Negative for headaches.  Hematological: Does not bruise/bleed easily.  Psychiatric/Behavioral: Negative for dysphoric mood. The patient is not nervous/anxious.        Objective:   Physical Exam Overweight female in no acute distress. Reeks of cigarette smoke Nose without purulence or discharge noted Neck without lymphadenopathy or thyromegaly Chest with rhonchi throughout, a few wheezes. Cardiac exam with mild tachycardia, questionable gallop Lower extremities with mild edema, no  cyanosis Alert and oriented, moves all 4 extremities.       Assessment & Plan:

## 2014-08-15 NOTE — Assessment & Plan Note (Signed)
The patient is continuing to have significant symptoms, most likely related to her ongoing airway inflammation from smoking. Will add Spiriva to her current medications, and I have stressed to her that she must stop smoking if she wishes to stay well.

## 2014-08-15 NOTE — Assessment & Plan Note (Signed)
The patient has increasing wheezing, congestion, and increased shortness of breath that is all consistent with an acute COPD exacerbation. She does not think that she has a chest cold, and is not bringing up purulent mucus. Will treat her with a course of steroids, but I stressed to her the importance of total smoking cessation.

## 2014-08-25 ENCOUNTER — Encounter: Payer: Self-pay | Admitting: Pulmonary Disease

## 2014-08-25 NOTE — Telephone Encounter (Signed)
Can call in nicotrol inhaler 4mg  Use 6 -10 cartriges a day for 4 weeks, then begin to decrease number of cartridges to zero over 6-12 weeks. Give her # 200, with one fill

## 2014-08-25 NOTE — Telephone Encounter (Signed)
KC please advise. Thanks. 

## 2014-08-28 MED ORDER — NICOTINE 10 MG IN INHA: RESPIRATORY_TRACT | Status: DC

## 2014-10-10 ENCOUNTER — Other Ambulatory Visit (HOSPITAL_COMMUNITY): Payer: Self-pay | Admitting: General Surgery

## 2014-10-10 DIAGNOSIS — R109 Unspecified abdominal pain: Secondary | ICD-10-CM

## 2014-10-16 ENCOUNTER — Ambulatory Visit (HOSPITAL_COMMUNITY)
Admission: RE | Admit: 2014-10-16 | Discharge: 2014-10-16 | Disposition: A | Discharge status: Home / Self Care | Payer: Medicare Other | Source: Ambulatory Visit | Attending: General Surgery | Admitting: General Surgery

## 2014-10-16 DIAGNOSIS — R109 Unspecified abdominal pain: Secondary | ICD-10-CM | POA: Insufficient documentation

## 2014-10-16 DIAGNOSIS — Z720 Tobacco use: Secondary | ICD-10-CM | POA: Diagnosis not present

## 2014-10-16 DIAGNOSIS — Z9221 Personal history of antineoplastic chemotherapy: Secondary | ICD-10-CM | POA: Insufficient documentation

## 2014-10-16 DIAGNOSIS — K5900 Constipation, unspecified: Secondary | ICD-10-CM | POA: Insufficient documentation

## 2014-10-16 DIAGNOSIS — C50919 Malignant neoplasm of unspecified site of unspecified female breast: Secondary | ICD-10-CM | POA: Insufficient documentation

## 2014-10-16 LAB — POCT I-STAT CREATININE: Creatinine, Ser: 1.2 mg/dL — ABNORMAL HIGH (ref 0.50–1.10)

## 2014-10-16 MED ORDER — IOHEXOL 300 MG/ML  SOLN
100.0000 mL | Freq: Once | INTRAMUSCULAR | Status: AC | PRN
  Administered 2014-10-16: 100 mL via INTRAVENOUS

## 2014-10-20 NOTE — H&P (Signed)
  NTS SOAP Note  Vital Signs:  Vitals as of: 3/71/6967: Systolic 893: Diastolic 89: Heart Rate 94: Temp 43F: Height 61f 6in: Weight 190Lbs 0 Ounces: BMI 30.67  BMI : 30.67 kg/m2  Subjective: This 59year old female presents for of abdominal wall swelling.  Has been present for some time now.  Made worse with straining.  Causea abdominal pain.  Occurs around umbilicus,  extends down to pelvis.  Review of Symptoms:  Constitutional:unremarkable   Head:unremarkable Eyes:blurred vision bilateral Nose/Mouth/Throat:unremarkable Cardiovascular:  unremarkable Respiratory:dyspnea, wheezing Gastrointestinabdominal pain, nausea Genitourinary:dysuria back,  neck,  and joint pain Skin:unremarkable Hematolgic/Lymphatic:unremarkable   Allergic/Immunologic:unremarkable   Past Medical History:  Reviewed  Past Medical History  Surgical History: lap cholecystectomy,  lap tah,  breast surgery Medical Problems: HTN,  copd,  anxiety Allergies: asa,  benztropine,  clindamycin,  niacin,  nifedipine,  propranolol,  pcn,  paroxetine,  nsaids,  trihexphenidyl, meperidine,  levaquin  Medications: ambien,  amlodipine,  ativan,  breo,  hydrocodone,  NTG,  omeprazole,  proair,  cetrizine,  spriva,  meontelukast   Social History:Reviewed  Social History  Preferred Language: English Race:  White Ethnicity: Not Hispanic / Latino Age: 16year Marital Status:  S Alcohol: ?   Smoking Status: Current every day smoker reviewed on 10/10/2014 Started Date:  Packs per day: 2.00 Functional Status reviewed on 10/10/2014 ------------------------------------------------ Bathing: Normal Cooking: Normal Dressing: Normal Driving: Normal Eating: Normal Managing Meds: Normal Oral Care: Normal Shopping: Normal Toileting: Normal Transferring: Normal Walking: Normal Cognitive Status reviewed on 10/10/2014 ------------------------------------------------ Attention: Normal Decision Making:  Normal Language: Normal Memory: Normal Motor: Normal Perception: Normal Problem Solving: Normal Visual and Spatial: Normal   Family History:Reviewed  Family Health History Mother, Deceased; Heart disease;  Father, Deceased; Heart disease;     Objective Information: General:Well appearing, well nourished in no distress. 1/6sem,  regular rate and rhythym Occassional inspiratory wheezing noted. Abdomen:Soft, NT/ND, no HSM, no masses.  Swelling noted with straining along right side or abdomen just superior to umbilicus.  Surgical scars present.  Umbilical/incisional hernia appreciated.  Assessment:Incisional hernia  Diagnoses: 553.21  K43.2 Incisional hernia (Incisional hernia without obstruction or gangrene)  Procedures: 981017- OFFICE OUTPATIENT NEW 30 MINUTES    Plan:  Scheduled for incisional herniorrhaphy with mesh on 10/27/2014. The risks and benefits of the procedure including bleeding, infection, mesh use, and the possibility of recurrence of the hernia were fully explained to the patient, who gave informed consent.

## 2014-10-25 ENCOUNTER — Encounter (HOSPITAL_COMMUNITY): Payer: Self-pay

## 2014-10-25 ENCOUNTER — Encounter (HOSPITAL_COMMUNITY)
Admission: RE | Admit: 2014-10-25 | Discharge: 2014-10-25 | Disposition: A | Discharge status: Home / Self Care | Payer: Medicare Other | Source: Ambulatory Visit | Attending: General Surgery | Admitting: General Surgery

## 2014-10-25 ENCOUNTER — Other Ambulatory Visit: Payer: Self-pay

## 2014-10-25 DIAGNOSIS — F419 Anxiety disorder, unspecified: Secondary | ICD-10-CM | POA: Diagnosis not present

## 2014-10-25 DIAGNOSIS — F329 Major depressive disorder, single episode, unspecified: Secondary | ICD-10-CM | POA: Diagnosis not present

## 2014-10-25 DIAGNOSIS — J449 Chronic obstructive pulmonary disease, unspecified: Secondary | ICD-10-CM | POA: Diagnosis not present

## 2014-10-25 DIAGNOSIS — K432 Incisional hernia without obstruction or gangrene: Secondary | ICD-10-CM | POA: Diagnosis not present

## 2014-10-25 DIAGNOSIS — F17210 Nicotine dependence, cigarettes, uncomplicated: Secondary | ICD-10-CM | POA: Diagnosis not present

## 2014-10-25 DIAGNOSIS — K219 Gastro-esophageal reflux disease without esophagitis: Secondary | ICD-10-CM | POA: Diagnosis not present

## 2014-10-25 DIAGNOSIS — I10 Essential (primary) hypertension: Secondary | ICD-10-CM | POA: Diagnosis not present

## 2014-10-25 HISTORY — DX: Pulmonary hypertension, unspecified: I27.20

## 2014-10-25 LAB — BASIC METABOLIC PANEL
Anion gap: 12 (ref 5–15)
BUN: 28 mg/dL — AB (ref 6–23)
CALCIUM: 9.7 mg/dL (ref 8.4–10.5)
CO2: 21 mmol/L (ref 19–32)
CREATININE: 1.13 mg/dL — AB (ref 0.50–1.10)
Chloride: 102 mmol/L (ref 96–112)
GFR calc Af Amer: 60 mL/min — ABNORMAL LOW (ref >90)
GFR, EST NON AFRICAN AMERICAN: 52 mL/min — AB (ref >90)
GLUCOSE: 150 mg/dL — AB (ref 70–99)
Potassium: 4.6 mmol/L (ref 3.5–5.1)
Sodium: 135 mmol/L (ref 135–145)

## 2014-10-25 LAB — CBC WITH DIFFERENTIAL/PLATELET
BASOS ABS: 0 10*3/uL (ref 0.0–0.1)
Basophils Relative: 0 % (ref 0–1)
EOS PCT: 0 % (ref 0–5)
Eosinophils Absolute: 0 10*3/uL (ref 0.0–0.7)
HEMATOCRIT: 46.2 % — AB (ref 36.0–46.0)
HEMOGLOBIN: 15.3 g/dL — AB (ref 12.0–15.0)
LYMPHS ABS: 3.4 10*3/uL (ref 0.7–4.0)
LYMPHS PCT: 31 % (ref 12–46)
MCH: 33.1 pg (ref 26.0–34.0)
MCHC: 33.1 g/dL (ref 30.0–36.0)
MCV: 100 fL (ref 78.0–100.0)
MONO ABS: 0.7 10*3/uL (ref 0.1–1.0)
Monocytes Relative: 7 % (ref 3–12)
Neutro Abs: 6.8 10*3/uL (ref 1.7–7.7)
Neutrophils Relative %: 62 % (ref 43–77)
Platelets: 309 10*3/uL (ref 150–400)
RBC: 4.62 MIL/uL (ref 3.87–5.11)
RDW: 12.6 % (ref 11.5–15.5)
WBC: 10.9 10*3/uL — ABNORMAL HIGH (ref 4.0–10.5)

## 2014-10-25 NOTE — Patient Instructions (Signed)
Kathleen Burns  10/25/2014   Your procedure is scheduled on:  10/27/2014  Report to [facility] at 6:15 AM.  Call this number if you have problems the morning of surgery: [phone]   Remember:   Do not eat food or drink liquids after midnight.   Take these medicines the morning of surgery with A SIP OF WATER: Ativan, Singulair, Morphine, Prilosec, Spiriva, Albuterol, (Bring  Albuterol inhaler with you), Amlodipine, Zyrtec, Breo-Ellipta inhaler, Hydrocodone   Do not wear jewelry, make-up or nail polish.  Do not wear lotions, powders, or perfumes. You may wear deodorant.  Do not shave 48 hours prior to surgery. Men may shave face and neck.  Do not bring valuables to the hospital.  [facility] is not responsible for any belongings or valuables.               Contacts, dentures or bridgework may not be worn into surgery.  Leave suitcase in the car. After surgery it may be brought to your room.  For patients admitted to the hospital, discharge time is determined by your treatment team.               Patients discharged the day of surgery will not be allowed to drive home.  Name and phone number of your driver:   Special Instructions: Shower using CHG 2 nights before surgery and the night before surgery.  If you shower the day of surgery use CHG.  Use special wash - you have one bottle of CHG for all showers.  You should use approximately 1/3 of the bottle for each shower. N/A   Please read over the following fact sheets that you were given: Surgical Site Infection Prevention and Anesthesia Post-op Instructions   PATIENT INSTRUCTIONS POST-ANESTHESIA  IMMEDIATELY FOLLOWING SURGERY:  Do not drive or operate machinery for the first twenty four hours after surgery.  Do not make any important decisions for twenty four hours after surgery or while taking narcotic pain medications or sedatives.  If you develop intractable nausea and vomiting or a severe headache please notify your doctor  immediately.  FOLLOW-UP:  Please make an appointment with your surgeon as instructed. You do not need to follow up with anesthesia unless specifically instructed to do so.  WOUND CARE INSTRUCTIONS (if applicable):  Keep a dry clean dressing on the anesthesia/puncture wound site if there is drainage.  Once the wound has quit draining you may leave it open to air.  Generally you should leave the bandage intact for twenty four hours unless there is drainage.  If the epidural site drains for more than 36-48 hours please call the anesthesia department.  QUESTIONS?:  Please feel free to call your physician or the hospital operator if you have any questions, and they will be happy to assist you.

## 2014-10-27 ENCOUNTER — Ambulatory Visit (HOSPITAL_COMMUNITY): Payer: Medicare Other | Admitting: Anesthesiology

## 2014-10-27 ENCOUNTER — Ambulatory Visit (HOSPITAL_COMMUNITY)
Admission: RE | Admit: 2014-10-27 | Discharge: 2014-10-27 | Disposition: A | Discharge status: Home / Self Care | Payer: Medicare Other | Source: Ambulatory Visit | Attending: General Surgery | Admitting: General Surgery

## 2014-10-27 ENCOUNTER — Encounter (HOSPITAL_COMMUNITY)
Admission: RE | Disposition: A | Discharge status: Home / Self Care | Payer: Self-pay | Source: Ambulatory Visit | Attending: General Surgery

## 2014-10-27 DIAGNOSIS — F419 Anxiety disorder, unspecified: Secondary | ICD-10-CM | POA: Insufficient documentation

## 2014-10-27 DIAGNOSIS — F17210 Nicotine dependence, cigarettes, uncomplicated: Secondary | ICD-10-CM | POA: Diagnosis not present

## 2014-10-27 DIAGNOSIS — I10 Essential (primary) hypertension: Secondary | ICD-10-CM | POA: Insufficient documentation

## 2014-10-27 DIAGNOSIS — K432 Incisional hernia without obstruction or gangrene: Secondary | ICD-10-CM | POA: Diagnosis not present

## 2014-10-27 DIAGNOSIS — J449 Chronic obstructive pulmonary disease, unspecified: Secondary | ICD-10-CM | POA: Diagnosis not present

## 2014-10-27 DIAGNOSIS — K219 Gastro-esophageal reflux disease without esophagitis: Secondary | ICD-10-CM | POA: Insufficient documentation

## 2014-10-27 DIAGNOSIS — F329 Major depressive disorder, single episode, unspecified: Secondary | ICD-10-CM | POA: Insufficient documentation

## 2014-10-27 HISTORY — PX: INCISIONAL HERNIA REPAIR: SHX193

## 2014-10-27 HISTORY — PX: INSERTION OF MESH: SHX5868

## 2014-10-27 SURGERY — REPAIR, HERNIA, INCISIONAL
Anesthesia: General | Site: Abdomen

## 2014-10-27 MED ORDER — SODIUM CHLORIDE 0.9 % IR SOLN
Status: DC | PRN
  Administered 2014-10-27: 500 mL

## 2014-10-27 MED ORDER — BUPIVACAINE HCL (PF) 0.5 % IJ SOLN
INTRAMUSCULAR | Status: AC
Start: 2014-10-27 — End: 2014-10-27
  Filled 2014-10-27: qty 30

## 2014-10-27 MED ORDER — GLYCOPYRROLATE 0.2 MG/ML IJ SOLN
0.2000 mg | Freq: Once | INTRAMUSCULAR | Status: AC
  Administered 2014-10-27: 0.2 mg via INTRAVENOUS

## 2014-10-27 MED ORDER — CHLORHEXIDINE GLUCONATE 4 % EX LIQD: 1.0000 "application " | Freq: Once | CUTANEOUS | Status: DC

## 2014-10-27 MED ORDER — BUPIVACAINE HCL (PF) 0.5 % IJ SOLN
INTRAMUSCULAR | Status: DC | PRN
  Administered 2014-10-27: 10 mL

## 2014-10-27 MED ORDER — MIDAZOLAM HCL 2 MG/2ML IJ SOLN
1.0000 mg | INTRAMUSCULAR | Status: DC | PRN
  Administered 2014-10-27: 2 mg via INTRAVENOUS

## 2014-10-27 MED ORDER — PROPOFOL 10 MG/ML IV BOLUS
INTRAVENOUS | Status: DC | PRN
  Administered 2014-10-27 (×3): 30 mg via INTRAVENOUS
  Administered 2014-10-27: 20 mg via INTRAVENOUS
  Administered 2014-10-27: 120 mg via INTRAVENOUS

## 2014-10-27 MED ORDER — POVIDONE-IODINE 10 % EX OINT
TOPICAL_OINTMENT | CUTANEOUS | Status: AC
  Filled 2014-10-27: qty 1

## 2014-10-27 MED ORDER — LIDOCAINE HCL (PF) 1 % IJ SOLN
INTRAMUSCULAR | Status: AC
  Filled 2014-10-27: qty 5

## 2014-10-27 MED ORDER — PROPOFOL 10 MG/ML IV BOLUS
INTRAVENOUS | Status: AC
  Filled 2014-10-27: qty 20

## 2014-10-27 MED ORDER — POVIDONE-IODINE 10 % OINT PACKET
TOPICAL_OINTMENT | CUTANEOUS | Status: DC | PRN
  Administered 2014-10-27: 1 via TOPICAL

## 2014-10-27 MED ORDER — METHYLPREDNISOLONE SODIUM SUCC 125 MG IJ SOLR
INTRAMUSCULAR | Status: AC
  Filled 2014-10-27: qty 2

## 2014-10-27 MED ORDER — MIDAZOLAM HCL 2 MG/2ML IJ SOLN
INTRAMUSCULAR | Status: AC
  Filled 2014-10-27: qty 2

## 2014-10-27 MED ORDER — FENTANYL CITRATE (PF) 100 MCG/2ML IJ SOLN
INTRAMUSCULAR | Status: DC | PRN
  Administered 2014-10-27 (×4): 50 ug via INTRAVENOUS

## 2014-10-27 MED ORDER — ALBUTEROL SULFATE (2.5 MG/3ML) 0.083% IN NEBU
2.5000 mg | INHALATION_SOLUTION | Freq: Four times a day (QID) | RESPIRATORY_TRACT | Status: DC | PRN
  Administered 2014-10-27: 2.5 mg via RESPIRATORY_TRACT

## 2014-10-27 MED ORDER — FENTANYL CITRATE (PF) 100 MCG/2ML IJ SOLN
INTRAMUSCULAR | Status: AC
  Filled 2014-10-27: qty 2

## 2014-10-27 MED ORDER — ONDANSETRON HCL 4 MG/2ML IJ SOLN
INTRAMUSCULAR | Status: AC
  Filled 2014-10-27: qty 2

## 2014-10-27 MED ORDER — LACTATED RINGERS IV SOLN
INTRAVENOUS | Status: DC
  Administered 2014-10-27: 08:00:00 via INTRAVENOUS

## 2014-10-27 MED ORDER — SUCCINYLCHOLINE CHLORIDE 20 MG/ML IJ SOLN
INTRAMUSCULAR | Status: DC | PRN
  Administered 2014-10-27: 120 mg via INTRAVENOUS

## 2014-10-27 MED ORDER — ONDANSETRON HCL 4 MG/2ML IJ SOLN: 4.0000 mg | Freq: Once | INTRAMUSCULAR | Status: DC | PRN

## 2014-10-27 MED ORDER — GLYCOPYRROLATE 0.2 MG/ML IJ SOLN
INTRAMUSCULAR | Status: AC
Start: 2014-10-27 — End: 2014-10-27
  Filled 2014-10-27: qty 1

## 2014-10-27 MED ORDER — ALBUTEROL SULFATE (2.5 MG/3ML) 0.083% IN NEBU
INHALATION_SOLUTION | RESPIRATORY_TRACT | Status: AC
  Filled 2014-10-27: qty 3

## 2014-10-27 MED ORDER — LIDOCAINE HCL (CARDIAC) 10 MG/ML IV SOLN
INTRAVENOUS | Status: DC | PRN
  Administered 2014-10-27: 20 mg via INTRAVENOUS
  Administered 2014-10-27: 30 mg via INTRAVENOUS

## 2014-10-27 MED ORDER — ARTIFICIAL TEARS OP OINT
TOPICAL_OINTMENT | OPHTHALMIC | Status: AC
  Filled 2014-10-27: qty 3.5

## 2014-10-27 MED ORDER — ONDANSETRON HCL 4 MG/2ML IJ SOLN
4.0000 mg | Freq: Once | INTRAMUSCULAR | Status: AC
  Administered 2014-10-27: 4 mg via INTRAVENOUS

## 2014-10-27 MED ORDER — METHYLPREDNISOLONE SODIUM SUCC 125 MG IJ SOLR
60.0000 mg | Freq: Once | INTRAMUSCULAR | Status: AC
  Administered 2014-10-27: 60 mg via INTRAVENOUS

## 2014-10-27 MED ORDER — VANCOMYCIN HCL IN DEXTROSE 1-5 GM/200ML-% IV SOLN
1000.0000 mg | INTRAVENOUS | Status: AC
  Administered 2014-10-27: 1000 mg via INTRAVENOUS
  Filled 2014-10-27: qty 200

## 2014-10-27 MED ORDER — FENTANYL CITRATE (PF) 100 MCG/2ML IJ SOLN
25.0000 ug | INTRAMUSCULAR | Status: DC | PRN
  Administered 2014-10-27 (×2): 50 ug via INTRAVENOUS
  Administered 2014-10-27 (×2): 25 ug via INTRAVENOUS
  Administered 2014-10-27: 50 ug via INTRAVENOUS

## 2014-10-27 MED ORDER — SODIUM CHLORIDE 0.9 % IN NEBU
INHALATION_SOLUTION | RESPIRATORY_TRACT | Status: AC
  Filled 2014-10-27: qty 3

## 2014-10-27 SURGICAL SUPPLY — 41 items
BAG HAMPER (MISCELLANEOUS) ×3 IMPLANT
CHLORAPREP W/TINT 26ML (MISCELLANEOUS) ×3 IMPLANT
CLOTH BEACON ORANGE TIMEOUT ST (SAFETY) ×3 IMPLANT
COVER LIGHT HANDLE STERIS (MISCELLANEOUS) ×6 IMPLANT
DECANTER SPIKE VIAL GLASS SM (MISCELLANEOUS) ×3 IMPLANT
ELECT REM PT RETURN 9FT ADLT (ELECTROSURGICAL) ×3
ELECTRODE REM PT RTRN 9FT ADLT (ELECTROSURGICAL) ×1 IMPLANT
FORMALIN 10 PREFIL 480ML (MISCELLANEOUS) IMPLANT
GAUZE SPONGE 4X4 12PLY STRL (GAUZE/BANDAGES/DRESSINGS) ×2 IMPLANT
GLOVE ECLIPSE 6.5 STRL STRAW (GLOVE) ×3 IMPLANT
GLOVE INDICATOR 7.0 STRL GRN (GLOVE) ×3 IMPLANT
GLOVE INDICATOR 7.5 STRL GRN (GLOVE) ×3 IMPLANT
GLOVE SURG SS PI 7.5 STRL IVOR (GLOVE) ×6 IMPLANT
GOWN STRL REUS W/ TWL XL LVL3 (GOWN DISPOSABLE) ×1 IMPLANT
GOWN STRL REUS W/TWL LRG LVL3 (GOWN DISPOSABLE) ×6 IMPLANT
GOWN STRL REUS W/TWL XL LVL3 (GOWN DISPOSABLE) ×2
INST SET MAJOR GENERAL (KITS) ×3 IMPLANT
KIT ROOM TURNOVER APOR (KITS) ×3 IMPLANT
MANIFOLD NEPTUNE II (INSTRUMENTS) ×3 IMPLANT
MESH VENTRALEX ST 2.5 CRC MED (Mesh General) ×3 IMPLANT
NEEDLE HYPO 25X1 1.5 SAFETY (NEEDLE) ×3 IMPLANT
NS IRRIG 1000ML POUR BTL (IV SOLUTION) ×3 IMPLANT
PACK ABDOMINAL MAJOR (CUSTOM PROCEDURE TRAY) ×3 IMPLANT
PAD ARMBOARD 7.5X6 YLW CONV (MISCELLANEOUS) ×3 IMPLANT
SET BASIN LINEN APH (SET/KITS/TRAYS/PACK) ×3 IMPLANT
SPONGE GAUZE 4X4 12PLY (GAUZE/BANDAGES/DRESSINGS) ×3 IMPLANT
STAPLER VISISTAT (STAPLE) ×3 IMPLANT
SUT ETHIBOND NAB MO 7 #0 18IN (SUTURE) ×3 IMPLANT
SUT NOVA NAB GS-21 1 T12 (SUTURE) IMPLANT
SUT NOVA NAB GS-22 2 2-0 T-19 (SUTURE) IMPLANT
SUT NOVA NAB GS-26 0 60 (SUTURE) IMPLANT
SUT PROLENE 0 CT 1 CR/8 (SUTURE) IMPLANT
SUT SILK 2 0 (SUTURE)
SUT SILK 2-0 18XBRD TIE 12 (SUTURE) IMPLANT
SUT VIC AB 2-0 CT1 27 (SUTURE) ×2
SUT VIC AB 2-0 CT1 TAPERPNT 27 (SUTURE) ×1 IMPLANT
SUT VIC AB 3-0 SH 27 (SUTURE) ×2
SUT VIC AB 3-0 SH 27X BRD (SUTURE) ×1 IMPLANT
SUT VIC AB 4-0 PS2 27 (SUTURE) IMPLANT
SYR 20CC LL (SYRINGE) ×3 IMPLANT
TAPE CLOTH SURG 4X10 WHT LF (GAUZE/BANDAGES/DRESSINGS) ×3 IMPLANT

## 2014-10-27 NOTE — Anesthesia Postprocedure Evaluation (Signed)
  Anesthesia Post-op Note  Patient: Kathleen Burns  Procedure(s) Performed: Procedure(s): INCISIONAL HERNIORRHAPHY WITH MESH (N/A) INSERTION OF MESH (N/A)  Patient Location: PACU  Anesthesia Type:General  Level of Consciousness: awake, alert  and patient cooperative  Airway and Oxygen Therapy: Patient Spontanous Breathing and non-rebreather face mask  Post-op Pain: moderate  Post-op Assessment: Post-op Vital signs reviewed, Patient's Cardiovascular Status Stable, Respiratory Function Stable, Patent Airway and No signs of Nausea or vomiting  Post-op Vital Signs: Reviewed and stable    Complications: No apparent anesthesia complications

## 2014-10-27 NOTE — Interval H&P Note (Signed)
History and Physical Interval Note:  10/27/2014 7:17 AM  Kathleen Burns  has presented today for surgery, with the diagnosis of incisional hernia  The various methods of treatment have been discussed with the patient and family. After consideration of risks, benefits and other options for treatment, the patient has consented to  Procedure(s): HERNIA REPAIR INCISIONAL WITH MESH (N/A) as a surgical intervention .  The patient's history has been reviewed, patient examined, no change in status, stable for surgery.  I have reviewed the patient's chart and labs.  Questions were answered to the patient's satisfaction.     Aviva Signs A

## 2014-10-27 NOTE — Discharge Instructions (Signed)
Open Hernia Repair, Care After °Refer to this sheet in the next few weeks. These instructions provide you with information on caring for yourself after your procedure. Your health care provider may also give you more specific instructions. Your treatment has been planned according to current medical practices, but problems sometimes occur. Call your health care provider if you have any problems or questions after your procedure. °WHAT TO EXPECT AFTER THE PROCEDURE °After your procedure, it is typical to have the following: °· Pain in your abdomen, especially along your incision. You will be given pain medicines to control the pain. °· Constipation. You may be given a stool softener to help prevent this. °HOME CARE INSTRUCTIONS  °· Only take over-the-counter or prescription medicines as directed by your health care provider. °· Keep the wound dry and clean. You may wash the wound gently with soap and water 48 hours after surgery. Gently blot or dab the wound dry. Do not take baths, use swimming pools, or use hot tubs for 10 days or until your health care provider approves. °· Change bandages (dressings) as directed by your health care provider. °· Continue your normal diet as directed by your health care provider. Eat plenty of fruits and vegetables to help prevent constipation. °· Drink enough fluids to keep your urine clear or pale yellow. This also helps prevent constipation. °· Do not drive until your health care provider says it is okay. °· Do not lift anything heavier than 10 pounds (4.5 kg) or play contact sports for 4 weeks or until your health care provider approves. °· Follow up with your health care provider as directed. Ask your health care provider when to make an appointment to have your stitches (sutures) or staples removed. °SEEK MEDICAL CARE IF:  °· You have increased bleeding coming from the incision site. °· You have blood in your stool. °· You have increasing pain in the wound. °· You see redness  or swelling in the wound. °· You have fluid (pus) coming from the wound. °· You have a fever. °· You notice a bad smell coming from the wound or dressing. °SEEK IMMEDIATE MEDICAL CARE IF:  °· You develop a rash. °· You have chest pain or shortness of breath. °· You feel lightheaded or feel faint. °Document Released: 01/03/2005 Document Revised: 04/06/2013 Document Reviewed: 01/26/2013 °ExitCare® Patient Information ©2015 [employer]. This information is not intended to replace advice given to you by your health care provider. Make sure you discuss any questions you have with your health care provider. °Ventral Hernia °A ventral hernia (also called an incisional hernia) is a hernia that occurs at the site of a previous surgical cut (incision) in the abdomen. The abdominal wall spans from your lower chest down to your pelvis. If the abdominal wall is weakened from a surgical incision, a hernia can occur. A hernia is a bulge of bowel or muscle tissue pushing out on the weakened part of the abdominal wall. Ventral hernias can get bigger from straining or lifting. °Obese and older people are at higher risk for a ventral hernia. People who develop infections after surgery or require repeat incisions at the same site on the abdomen are also at increased risk. °CAUSES  °A ventral hernia occurs because of weakness in the abdominal wall at an incision site.  °SYMPTOMS  °Common symptoms include: °· A visible bulge or lump on the abdominal wall. °· Pain or tenderness around the lump. °· Increased discomfort if you cough or make a   sudden movement. °If the hernia has blocked part of the intestine, a serious complication can occur (incarcerated or strangulated hernia). This can become a problem that requires emergency surgery because the blood flow to the blocked intestine may be cut off. Symptoms may include: °· Feeling sick to your stomach (nauseous). °· Throwing up (vomiting). °· Stomach swelling (distention) or  bloating. °· Fever. °· Rapid heartbeat. °DIAGNOSIS  °Your health care provider will take a medical history and perform a physical exam. Various tests may be ordered, such as: °· Blood tests. °· Urine tests. °· Ultrasonography. °· X-rays. °· Computed tomography (CT). °TREATMENT  °Watchful waiting may be all that is needed for a smaller hernia that does not cause symptoms. Your health care provider may recommend the use of a supportive belt (truss) that helps to keep the abdominal wall intact. For larger hernias or those that cause pain, surgery to repair the hernia is usually recommended. If a hernia becomes strangulated, emergency surgery needs to be done right away. °HOME CARE INSTRUCTIONS °· Avoid putting pressure or strain on the abdominal area. °· Avoid heavy lifting. °· Use good body positioning for physical tasks. Ask your health care provider about proper body positioning. °· Use a supportive belt as directed by your health care provider. °· Maintain a healthy weight. °· Eat foods that are high in fiber, such as whole grains, fruits, and vegetables. Fiber helps prevent difficult bowel movements (constipation). °· Drink enough fluids to keep your urine clear or pale yellow. °· Follow up with your health care provider as directed. °SEEK MEDICAL CARE IF:  °· Your hernia seems to be getting larger or more painful. °SEEK IMMEDIATE MEDICAL CARE IF:  °· You have abdominal pain that is sudden and sharp. °· Your pain becomes severe. °· You have repeated vomiting. °· You are sweating a lot. °· You notice a rapid heartbeat. °· You develop a fever. °MAKE SURE YOU:  °· Understand these instructions. °· Will watch your condition. °· Will get help right away if you are not doing well or get worse. °Document Released: 06/02/2012 Document Revised: 10/31/2013 Document Reviewed: 06/02/2012 °ExitCare® Patient Information ©2015 [employer]. This information is not intended to replace advice given to you by your health care  provider. Make sure you discuss any questions you have with your health care provider. ° °

## 2014-10-27 NOTE — Anesthesia Procedure Notes (Signed)
Procedure Name: Intubation Date/Time: 10/27/2014 7:52 AM Performed by: Vista Deck Pre-anesthesia Checklist: Patient identified, Patient being monitored, Timeout performed, Emergency Drugs available and Suction available Patient Re-evaluated:Patient Re-evaluated prior to inductionOxygen Delivery Method: Circle System Utilized Preoxygenation: Pre-oxygenation with 100% oxygen Intubation Type: IV induction, Rapid sequence and Cricoid Pressure applied Laryngoscope Size: Miller and 2 Grade View: Grade I Tube type: Oral Tube size: 7.0 mm Number of attempts: 1 Airway Equipment and Method: Stylet and Oral airway Placement Confirmation: ETT inserted through vocal cords under direct vision,  positive ETCO2 and breath sounds checked- equal and bilateral Secured at: 22 cm Tube secured with: Tape Dental Injury: Teeth and Oropharynx as per pre-operative assessment

## 2014-10-27 NOTE — Op Note (Signed)
Patient:  Kathleen Burns  DOB:  1956/01/26  MRN:  [id number]   Preop Diagnosis:  Incisional hernia  Postop Diagnosis:  Same  Procedure:  Incisional herniorrhaphy with mesh  Surgeon:  Aviva Signs, M.D.  Anes:  Gen. endotracheal  Indications:  Patient is a 59 year old white female status post laparoscopic cholecystectomy in the past who presents with periumbilical pain. She does have a incisional hernia at the umbilicus. The risks and benefits of the procedure including bleeding, infection, cardiopulmonary difficulties, mesh use, and the possibility of recurrence of the hernia were fully explained to the patient, who gave informed consent.  Procedure note:  The patient is placed the supine position. After induction of general endotracheal anesthesia, the abdomen was prepped and draped using usual sterile technique with DuraPrep. Surgical site confirmation was performed.  The infraumbilical incision was made through the previous surgical scar down to the fascia. The umbilicus was freed away from the underlying fascia. A 1/2-2 cm hernia defect was noted. Omentum was placed back into the abdominal cavity. A 6.4 cm Bard ventralax ST patch was then inserted and secured to the fascia using 0 Ethibond interrupted sutures. The overlying muscle was re-approximated transversely using 0 Ethibond interrupted sutures. The umbilicus was secured to the fascia using a 2-0 Vicryl interrupted suture. The subcutaneous layer was reapproximated using 3-0 Vicryl interrupted sutures. 0.5% Sensorcaine was instilled the surrounding wound. The skin was closed using staples. He denied ointment and dry sterile dressings were applied.  All tape and needle counts were correct at the end of the procedure. The patient was extubated in the operating room and transferred to PACU in stable condition.  Complications:  None  EBL:  Minimal  Specimen:  None

## 2014-10-27 NOTE — Anesthesia Preprocedure Evaluation (Signed)
Anesthesia Evaluation    Airway Mallampati: II  TM Distance: >3 FB     Dental  (+) Poor Dentition, Missing, Chipped, Dental Advisory Given   Pulmonary shortness of breath and with exertion, COPD (RA O2 sat=88%) COPD inhaler, Current Smoker,  breath sounds clear to auscultation  + decreased breath sounds      Cardiovascular hypertension, Pt. on medications + DOE Rhythm:Regular Rate:Normal     Neuro/Psych PSYCHIATRIC DISORDERS Anxiety Depression    GI/Hepatic GERD-  Medicated and Controlled,  Endo/Other    Renal/GU Renal disease     Musculoskeletal   Abdominal   Peds  Hematology   Anesthesia Other Findings   Reproductive/Obstetrics                             Anesthesia Physical Anesthesia Plan  ASA: III  Anesthesia Plan: General   Post-op Pain Management:    Induction: Intravenous, Rapid sequence and Cricoid pressure planned  Airway Management Planned: Oral ETT  Additional Equipment:   Intra-op Plan:   Post-operative Plan: Extubation in OR  Informed Consent: I have reviewed the patients History and Physical, chart, labs and discussed the procedure including the risks, benefits and alternatives for the proposed anesthesia with the patient or authorized representative who has indicated his/her understanding and acceptance.     Plan Discussed with:   Anesthesia Plan Comments:         Anesthesia Quick Evaluation

## 2014-10-27 NOTE — Transfer of Care (Signed)
Immediate Anesthesia Transfer of Care Note  Patient: Kathleen Burns  Procedure(s) Performed: Procedure(s): INCISIONAL HERNIORRHAPHY WITH MESH (N/A) INSERTION OF MESH (N/A)  Patient Location: PACU  Anesthesia Type:General  Level of Consciousness: sedated and patient cooperative  Airway & Oxygen Therapy: Patient Spontanous Breathing and non-rebreather face mask   Post vital signs: Reviewed and stable    Complications: No apparent anesthesia complications

## 2014-10-30 ENCOUNTER — Encounter (HOSPITAL_COMMUNITY): Payer: Self-pay | Admitting: General Surgery

## 2015-02-13 ENCOUNTER — Ambulatory Visit: Payer: PRIVATE HEALTH INSURANCE | Admitting: Pulmonary Disease

## 2015-02-16 ENCOUNTER — Ambulatory Visit: Payer: PRIVATE HEALTH INSURANCE | Admitting: Pulmonary Disease

## 2015-03-06 ENCOUNTER — Ambulatory Visit (INDEPENDENT_AMBULATORY_CARE_PROVIDER_SITE_OTHER): Payer: Medicare Other | Admitting: Pulmonary Disease

## 2015-03-06 ENCOUNTER — Other Ambulatory Visit (INDEPENDENT_AMBULATORY_CARE_PROVIDER_SITE_OTHER): Payer: Medicare Other

## 2015-03-06 ENCOUNTER — Ambulatory Visit (INDEPENDENT_AMBULATORY_CARE_PROVIDER_SITE_OTHER)
Admission: RE | Admit: 2015-03-06 | Discharge: 2015-03-06 | Disposition: A | Discharge status: Home / Self Care | Payer: Medicare Other | Source: Ambulatory Visit | Attending: Pulmonary Disease | Admitting: Pulmonary Disease

## 2015-03-06 ENCOUNTER — Encounter: Payer: Self-pay | Admitting: Pulmonary Disease

## 2015-03-06 VITALS — BP 130/74 | HR 121 | Ht 66.0 in | Wt 185.0 lb

## 2015-03-06 DIAGNOSIS — R0602 Shortness of breath: Secondary | ICD-10-CM

## 2015-03-06 DIAGNOSIS — I270 Primary pulmonary hypertension: Secondary | ICD-10-CM

## 2015-03-06 DIAGNOSIS — Z720 Tobacco use: Secondary | ICD-10-CM

## 2015-03-06 DIAGNOSIS — R0609 Other forms of dyspnea: Secondary | ICD-10-CM | POA: Diagnosis not present

## 2015-03-06 DIAGNOSIS — J438 Other emphysema: Secondary | ICD-10-CM

## 2015-03-06 DIAGNOSIS — F17200 Nicotine dependence, unspecified, uncomplicated: Secondary | ICD-10-CM

## 2015-03-06 DIAGNOSIS — I272 Pulmonary hypertension, unspecified: Secondary | ICD-10-CM

## 2015-03-06 LAB — CBC WITH DIFFERENTIAL/PLATELET
BASOS ABS: 0 10*3/uL (ref 0.0–0.1)
Basophils Relative: 0.3 % (ref 0.0–3.0)
EOS ABS: 0 10*3/uL (ref 0.0–0.7)
Eosinophils Relative: 0 % (ref 0.0–5.0)
HCT: 45.8 % (ref 36.0–46.0)
Hemoglobin: 15.2 g/dL — ABNORMAL HIGH (ref 12.0–15.0)
LYMPHS ABS: 3.3 10*3/uL (ref 0.7–4.0)
Lymphocytes Relative: 27.7 % (ref 12.0–46.0)
MCHC: 33.2 g/dL (ref 30.0–36.0)
MCV: 98.3 fl (ref 78.0–100.0)
MONO ABS: 0.8 10*3/uL (ref 0.1–1.0)
Monocytes Relative: 6.4 % (ref 3.0–12.0)
NEUTROS ABS: 7.7 10*3/uL (ref 1.4–7.7)
NEUTROS PCT: 65.6 % (ref 43.0–77.0)
PLATELETS: 362 10*3/uL (ref 150.0–400.0)
RBC: 4.66 Mil/uL (ref 3.87–5.11)
RDW: 13 % (ref 11.5–15.5)
WBC: 11.8 10*3/uL — ABNORMAL HIGH (ref 4.0–10.5)

## 2015-03-06 LAB — BRAIN NATRIURETIC PEPTIDE: PRO B NATRI PEPTIDE: 28 pg/mL (ref 0.0–100.0)

## 2015-03-06 NOTE — Assessment & Plan Note (Signed)
She has pulmonary hypertension in the setting of severe COPD symptoms. I explained to her today that I do not feel that she would respond to pulmonary vasodilation. We need to focus our care on her COPD and looking for other lung and/or heart problems as detailed below.

## 2015-03-06 NOTE — Assessment & Plan Note (Signed)
Even though she only had moderate airflow obstruction on 2014 pulmonary function testing, she has severe COPD and that she has recurrent exacerbations and severe symptoms. This is clearly due to ongoing tobacco use, but sometimes alternative causes such as fungal infections or hypersensitivity reactions can cause ongoing symptoms. The differential diagnosis of shortness of breath is somewhat broad in her, see discussion below.  Plan: Serum IgE and CBC with differential to look at eosinophil level Continue Breo and Spiriva Repeat pulmonary function testing to see if COPD has worsened since 2014 See discussion below

## 2015-03-06 NOTE — Assessment & Plan Note (Signed)
This problem has been worsening lately. The combination of symptoms she has including chest tightness, chest pain and shortness of breath are somewhat nonspecific. I explained to her that her pretest probability of ischemic cardiac disease is actually quite high. While shortness of breath could be due to her COPD, the chest pain is worrisome for ischemic heart disease.  Further, its also possible that she could have an alternative lung disease as she does have some crackles on exam today.  Plan: Repeat pulmonary function testing Chest x-ray today Cardiac stress test Check CBC Check BMP If the above listed tests are normal or unchanged then I will consider high-resolution CT chest look for interstitial lung disease Check ambulatory oximetry monitoring today Follow-up 3-4 weeks

## 2015-03-06 NOTE — Progress Notes (Signed)
Subjective:    Patient ID: Kathleen Burns, female    DOB: 09/22/1955, 60 y.o.   MRN: [id number]  Synopsis: Former patient of Dr. Gwenette Greet with COPD and pulmonary hypertension Echo 04/2012:  Normal EF, LVH with diastolic dysfunction and elevated LV end-diastolic pressure, mildly dilated LA, mild decrease in RV function, PA peak pressure 31m. [place]:  Mean PA 35, PCWP 15, Fick CO 4.2, PVR 4-5 Felt to be due to her copd, as well as diastolic dysfxn. ONO RA 2014: desat to 80% >> started on nocturnal oxygen.  PFT's 08/2012:  FEV1 2.11 (75%), ratio 66, +airtrapping, no restriction, DLCO 38%. ONO 2014:  desat to 80% >> started on nocturnal oxygen >> turned back in.  Did not want.  HPI Chief Complaint  Patient presents with  . Follow-up    Former [name] being seen for emphysema.  pt c/o nonprod cough, chest tightness with exertion.  CAT score 222  Kathleen Burns is here to establish care for COPD.  She is having more trouble breahting and feels chest pain a lot.  She says that she feels like an elephant is on her chest with pain when exerting herself.  REcently when walking through the grocery store she had severe pain.  The pain lasted for 45 minutes total.  She felt like she couldn't breahte at the time.  She has never had a heart attack.  She has had several heart catheterizations and has been told she had CHF.  She says that different cardiologists have given her varying stories regarding possible CHF.  Ankles swells sometime, not too bad.  She coughs a lot, doesn't bring up mucus but feels like there is something blocking her breahting.  Never gets anythingup.  No more wheezing lately.  No positional change lately.  Walking makes her more dyspneic.  Takes proAir twice a day, makes her feel more tachycardic.  She still smokes 2 ppd.   Past Medical History  Diagnosis Date  . COPD (chronic obstructive pulmonary disease)   . Chest pain     Cardiac catheterization in 1999, 2003, 2008-normal EF, normal  coronaries  . GERD (gastroesophageal reflux disease)   . Hypertension   . Meige disease (lymphedema praecox)     [facility]  . Osteoarthritis   . Breast cancer     Left mastectomy in 1990s + chemotherapy  . Hyperlipidemia   . Anxiety and depression     H/o suicide attempt  . Skull fracture 1969    surgical repair in 1969  . Hearing impairment   . Tobacco abuse     45 pack years  . Obesity   . Pulmonary hypertension       Review of Systems  Constitutional: Positive for fatigue. Negative for fever and chills.  HENT: Negative for postnasal drip, rhinorrhea and sinus pressure.   Respiratory: Positive for cough and shortness of breath. Negative for wheezing.   Cardiovascular: Positive for chest pain. Negative for palpitations and leg swelling.       Objective:   Physical Exam Filed Vitals:   03/06/15 1455  BP: 130/74  Pulse: 121  Height: '5\' 6"'$  (1.676 m)  Weight: 185 lb (83.915 kg)  SpO2: 94%   RA  Gen: chronically ill appearing HENT: OP clear, TM's clear, neck supple PULM: Crackles bases, normal percussion CV: RRR, no mgr, notable pretibial edema GI: BS+, soft, nontender Derm: no cyanosis or rash Psyche: normal mood and affect  February 2016 chest x-ray images personally reviewed showing cardiomegaly, diaphragmatic hernia, and emphysema  Op note from recent hernia repair reviewed      Assessment & Plan:  Pulmonary hypertension She has pulmonary hypertension in the setting of severe COPD symptoms. I explained to her today that I do not feel that she would respond to pulmonary vasodilation. We need to focus our care on her COPD and looking for other lung and/or heart problems as detailed below.  COPD (chronic obstructive pulmonary disease) with emphysema Even though she only had moderate airflow obstruction on 2014 pulmonary function testing, she has severe COPD and that she has recurrent  exacerbations and severe symptoms. This is clearly due to ongoing tobacco use, but sometimes alternative causes such as fungal infections or hypersensitivity reactions can cause ongoing symptoms. The differential diagnosis of shortness of breath is somewhat broad in her, see discussion below.  Plan: Serum IgE and CBC with differential to look at eosinophil level Continue Breo and Spiriva Repeat pulmonary function testing to see if COPD has worsened since 2014 See discussion below  DOE (dyspnea on exertion) This problem has been worsening lately. The combination of symptoms she has including chest tightness, chest pain and shortness of breath are somewhat nonspecific. I explained to her that her pretest probability of ischemic cardiac disease is actually quite high. While shortness of breath could be due to her COPD, the chest pain is worrisome for ischemic heart disease.  Further, its also possible that she could have an alternative lung disease as she does have some crackles on exam today.  Plan: Repeat pulmonary function testing Chest x-ray today Cardiac stress test Check CBC Check BMP If the above listed tests are normal or unchanged then I will consider high-resolution CT chest look for interstitial lung disease Check ambulatory oximetry monitoring today Follow-up 3-4 weeks  Tobacco use disorder She has failed multiple modalities of smoking cessation in the past including nicotine replacement, Nicotrol, and Chantix. She is interested in hypnosis. She was counseled at length to quit smoking today.     Current outpatient prescriptions:  .  albuterol (PROAIR HFA) 108 (90 BASE) MCG/ACT inhaler, Inhale 2 puffs into the lungs 4 (four) times daily as needed. For shortness of breath , Disp: , Rfl:  .  amLODipine (NORVASC) 10 MG tablet, Take 1 tablet (10 mg total) by mouth daily., Disp: 90 tablet, Rfl: 3 .  cetirizine (ZYRTEC) 10 MG tablet, Take 10 mg by mouth as needed for allergies. ,  Disp: , Rfl:  .  Fluticasone Furoate-Vilanterol (BREO ELLIPTA) 100-25 MCG/INH AEPB, Inhale 1 puff into the lungs daily., Disp: 90 each, Rfl: 3 .  HYDROcodone-acetaminophen (NORCO) 10-325 MG per tablet, Take 1 tablet by mouth every 6 (six) hours as needed. For pain , Disp: , Rfl:  .  LORazepam (ATIVAN) 1 MG tablet, Take 1 mg by mouth every 8 (eight) hours.  , Disp: , Rfl:  .  montelukast (SINGULAIR) 10 MG tablet, Take 10 mg by mouth at bedtime., Disp: , Rfl:  .  morphine (AVINZA) 120 MG 24 hr capsule, Take 120 mg by mouth 2 (two) times daily. , Disp: , Rfl:  .  Morphine-Naltrexone (EMBEDA) 80-3.2 MG CPCR, Take 1 tablet by mouth 2 (two) times daily., Disp: , Rfl:  .  nicotine (NICOTROL) 10 MG inhaler, Use 6 -10 cartriges a day for 4 weeks, then begin to decrease number of cartridges to zero over 6-12 weeks., Disp: 200 each, Rfl: 1 .  nitroGLYCERIN (NITROSTAT)  0.4 MG SL tablet, Place 1 tablet (0.4 mg total) under the tongue every 5 (five) minutes as needed for chest pain., Disp: 25 tablet, Rfl: 3 .  omeprazole (PRILOSEC) 20 MG capsule, Take 20 mg by mouth 2 (two) times daily.  , Disp: , Rfl:  .  OnabotulinumtoxinA (BOTOX IJ), Inject 80 Units as directed every 6 (six) months. , Disp: , Rfl:  .  Tiotropium Bromide Monohydrate (SPIRIVA RESPIMAT) 2.5 MCG/ACT AERS, Inhale 2 puffs into the lungs every morning., Disp: 3 Inhaler, Rfl: 2 .  zolpidem (AMBIEN CR) 12.5 MG CR tablet, Take 12.5 mg by mouth at bedtime.  , Disp: , Rfl:

## 2015-03-06 NOTE — Assessment & Plan Note (Signed)
She has failed multiple modalities of smoking cessation in the past including nicotine replacement, Nicotrol, and Chantix. She is interested in hypnosis. She was counseled at length to quit smoking today.

## 2015-03-06 NOTE — Patient Instructions (Signed)
Quit smoking Keep taking your inhaled medicines as you are doing We will call you with the results of the bloodwork We will schedule a lung function tests We will call you with the results of the chest x-ray We will see you back in 3-4 weeks after the stress test has been completed

## 2015-03-07 LAB — IGE: IgE (Immunoglobulin E), Serum: 36 kU/L (ref <115)

## 2015-03-08 ENCOUNTER — Telehealth (HOSPITAL_COMMUNITY): Payer: Self-pay | Admitting: *Deleted

## 2015-03-08 ENCOUNTER — Telehealth (HOSPITAL_COMMUNITY): Payer: Self-pay | Admitting: Radiology

## 2015-03-08 NOTE — Telephone Encounter (Signed)
Patient given detailed instructions per Myocardial Perfusion Study Information Sheet for test on 03/13/15 at 12:15. Patient notified to arrive 15 minutes early and that it is imperative to arrive on time for appointment to keep from having the test rescheduled.  If you need to cancel or reschedule your appointment, please call the office within 24 hours of your appointment. Failure to do so may result in a cancellation of your appointment, and a $50 no show fee. Patient verbalized understanding. EHK

## 2015-03-08 NOTE — Telephone Encounter (Signed)
Left message on voicemail in reference to upcoming appointment scheduled for 03/13/15. Phone number given for a call back so details instructions can be given. Hubbard Robinson, RN

## 2015-03-13 ENCOUNTER — Ambulatory Visit (HOSPITAL_COMMUNITY): Payer: Medicare Other | Attending: Cardiology

## 2015-03-13 DIAGNOSIS — R002 Palpitations: Secondary | ICD-10-CM | POA: Insufficient documentation

## 2015-03-13 DIAGNOSIS — R9439 Abnormal result of other cardiovascular function study: Secondary | ICD-10-CM | POA: Insufficient documentation

## 2015-03-13 DIAGNOSIS — R079 Chest pain, unspecified: Secondary | ICD-10-CM | POA: Insufficient documentation

## 2015-03-13 DIAGNOSIS — I10 Essential (primary) hypertension: Secondary | ICD-10-CM | POA: Diagnosis not present

## 2015-03-13 DIAGNOSIS — F17200 Nicotine dependence, unspecified, uncomplicated: Secondary | ICD-10-CM | POA: Diagnosis not present

## 2015-03-13 DIAGNOSIS — R0602 Shortness of breath: Secondary | ICD-10-CM | POA: Diagnosis present

## 2015-03-13 LAB — MYOCARDIAL PERFUSION IMAGING
CHL CUP NUCLEAR SDS: 5
CHL CUP NUCLEAR SRS: 2
CHL CUP NUCLEAR SSS: 7
CHL CUP RESTING HR STRESS: 100 {beats}/min
LHR: 0.2
LV sys vol: 23 mL
LVDIAVOL: 76 mL
Peak HR: 108 {beats}/min
TID: 1.09

## 2015-03-13 MED ORDER — TECHNETIUM TC 99M SESTAMIBI GENERIC - CARDIOLITE
32.8000 | Freq: Once | INTRAVENOUS | Status: AC | PRN
  Administered 2015-03-13: 32.8 via INTRAVENOUS

## 2015-03-13 MED ORDER — REGADENOSON 0.4 MG/5ML IV SOLN
0.4000 mg | Freq: Once | INTRAVENOUS | Status: AC
  Administered 2015-03-13: 0.4 mg via INTRAVENOUS

## 2015-03-13 MED ORDER — TECHNETIUM TC 99M SESTAMIBI GENERIC - CARDIOLITE
11.0000 | Freq: Once | INTRAVENOUS | Status: AC | PRN
  Administered 2015-03-13: 11 via INTRAVENOUS

## 2015-03-14 ENCOUNTER — Ambulatory Visit (HOSPITAL_COMMUNITY)
Admission: RE | Admit: 2015-03-14 | Discharge: 2015-03-14 | Disposition: A | Discharge status: Home / Self Care | Payer: Medicare Other | Source: Ambulatory Visit | Attending: Pulmonary Disease | Admitting: Pulmonary Disease

## 2015-03-14 DIAGNOSIS — R0602 Shortness of breath: Secondary | ICD-10-CM | POA: Diagnosis present

## 2015-03-14 DIAGNOSIS — J438 Other emphysema: Secondary | ICD-10-CM

## 2015-03-14 LAB — PULMONARY FUNCTION TEST
DL/VA % PRED: 42 %
DL/VA: 2.14 ml/min/mmHg/L
DLCO UNC % PRED: 29 %
DLCO unc: 7.97 ml/min/mmHg
FEF 25-75 POST: 1.25 L/s
FEF 25-75 Pre: 0.78 L/sec
FEF2575-%CHANGE-POST: 60 %
FEF2575-%PRED-POST: 51 %
FEF2575-%Pred-Pre: 32 %
FEV1-%CHANGE-POST: 14 %
FEV1-%PRED-PRE: 59 %
FEV1-%Pred-Post: 67 %
FEV1-POST: 1.86 L
FEV1-Pre: 1.62 L
FEV1FVC-%CHANGE-POST: 8 %
FEV1FVC-%Pred-Pre: 79 %
FEV6-%Change-Post: 5 %
FEV6-%Pred-Post: 80 %
FEV6-%Pred-Pre: 76 %
FEV6-PRE: 2.61 L
FEV6-Post: 2.75 L
FEV6FVC-%Change-Post: 0 %
FEV6FVC-%PRED-PRE: 102 %
FEV6FVC-%Pred-Post: 103 %
FVC-%CHANGE-POST: 4 %
FVC-%PRED-POST: 77 %
FVC-%Pred-Pre: 74 %
FVC-PRE: 2.63 L
FVC-Post: 2.75 L
POST FEV1/FVC RATIO: 67 %
PRE FEV6/FVC RATIO: 99 %
Post FEV6/FVC ratio: 100 %
Pre FEV1/FVC ratio: 62 %
RV % PRED: 159 %
RV: 3.34 L
TLC % pred: 112 %
TLC: 6.03 L

## 2015-03-14 MED ORDER — ALBUTEROL SULFATE (2.5 MG/3ML) 0.083% IN NEBU
2.5000 mg | INHALATION_SOLUTION | Freq: Once | RESPIRATORY_TRACT | Status: AC
  Administered 2015-03-14: 2.5 mg via RESPIRATORY_TRACT

## 2015-03-19 ENCOUNTER — Encounter: Payer: Self-pay | Admitting: Pulmonary Disease

## 2015-03-19 NOTE — Telephone Encounter (Signed)
BQ please advise on pt's results. Thanks.

## 2015-03-29 NOTE — Telephone Encounter (Signed)
Pt returning call and can be reached @ [phone].Kathleen Burns

## 2015-04-12 ENCOUNTER — Ambulatory Visit: Payer: Medicare Other | Admitting: Pulmonary Disease

## 2015-05-10 ENCOUNTER — Encounter: Payer: Self-pay | Admitting: Pulmonary Disease

## 2015-05-10 ENCOUNTER — Ambulatory Visit (INDEPENDENT_AMBULATORY_CARE_PROVIDER_SITE_OTHER): Payer: Medicare Other | Admitting: Pulmonary Disease

## 2015-05-10 VITALS — BP 130/86 | HR 117 | Ht 66.0 in | Wt 188.0 lb

## 2015-05-10 DIAGNOSIS — F17200 Nicotine dependence, unspecified, uncomplicated: Secondary | ICD-10-CM

## 2015-05-10 DIAGNOSIS — R079 Chest pain, unspecified: Secondary | ICD-10-CM | POA: Diagnosis not present

## 2015-05-10 DIAGNOSIS — Z23 Encounter for immunization: Secondary | ICD-10-CM | POA: Diagnosis not present

## 2015-05-10 DIAGNOSIS — I272 Other secondary pulmonary hypertension: Secondary | ICD-10-CM | POA: Diagnosis not present

## 2015-05-10 DIAGNOSIS — J438 Other emphysema: Secondary | ICD-10-CM

## 2015-05-10 DIAGNOSIS — R0609 Other forms of dyspnea: Secondary | ICD-10-CM

## 2015-05-10 NOTE — Assessment & Plan Note (Addendum)
She continues to struggle with chest tightness and shortness of breath. This is due to her COPD which is worse compared to 2014. Her ongoing tobacco use continues to cause her lungs to deteriorate. However, it should be noted that her airflow obstruction is only moderate in severity. She does have significant air trapping which is consistent with her severe COPD.  Plan: Continue current inhaled therapy (Breo and Spiriva) Counseled today at length to quit smoking. Flu shot today

## 2015-05-10 NOTE — Assessment & Plan Note (Signed)
Counseled at length to quit smoking today. 

## 2015-05-10 NOTE — Progress Notes (Signed)
Subjective:    Patient ID: Kathleen Burns, female    DOB: 1955/09/01, 59 y.o.   MRN: [id number]  Synopsis: Former patient of Dr. Gwenette Greet with COPD and pulmonary hypertension Echo 04/2012:  Normal EF, LVH with diastolic dysfunction and elevated LV end-diastolic pressure, mildly dilated LA, mild decrease in RV function, PA peak pressure 34m. [place]:  Mean PA 35, PCWP 15, Fick CO 4.2, PVR 4-5 Felt to be due to her copd, as well as diastolic dysfxn. ONO RA 2014: desat to 80% >> started on nocturnal oxygen.  PFT's 08/2012:  FEV1 2.11 (75%), ratio 66, +airtrapping, no restriction, DLCO 38%. ONO 2014:  desat to 80% >> started on nocturnal oxygen >> turned back in.  Did not want. September 2016 pulmonary function testing ratio 67%, FEV1 1.86 L (67% predicted), total lung capacity 6.03 L (112% predicted), DLCO 7.97 (29% predicted), residual volume 3.34 L (159% predicted).  HPI Chief Complaint  Patient presents with  . Follow-up    review pft and stress test. pt states she's at baseline-intermittent chest pain, nonprod cough.     She continues to struggle with shortness of breath and chest pain. She's been having a lot of stress in her life recently so she continues to smoke cigarettes. She continues to complain of chest pain which radiates to her jaw and left arm and occurs on a weekly basis. She says that when she had her stress test she felt like she nearly blacked out. I have reviewed the results from the stress test and there is no documentation of this. She continues to take her medications as prescribed.  Past Medical History  Diagnosis Date  . COPD [place]chronic obstructive pulmonary disease) ([place]   . Chest pain     Cardiac catheterization in 1999, 2003, 2008-normal EF, normal coronaries  . GERD (gastroesophageal reflux disease)   . Hypertension   . Meige disease (lymphedema praecox)     [facility]  . Osteoarthritis   .  Breast cancer ([place]     Left mastectomy in 1990s + chemotherapy  . Hyperlipidemia   . Anxiety and depression     H/o suicide attempt  . Skull fracture ([facility] 1969    surgical repair in 1969  . Hearing impairment   . Tobacco abuse     45 pack years  . Obesity   . Pulmonary hypertension ([place]       Review of Systems  Constitutional: Positive for fatigue. Negative for fever and chills.  HENT: Negative for postnasal drip, rhinorrhea and sinus pressure.   Respiratory: Positive for cough and shortness of breath. Negative for wheezing.   Cardiovascular: Positive for chest pain. Negative for palpitations and leg swelling.       Objective:   Physical Exam Filed Vitals:   05/10/15 1528  BP: 130/86  Pulse: 117  Height: '5\' 6"'$  (1.676 m)  Weight: 188 lb (85.276 kg)  SpO2: 90%   RA  Gen: chronically ill appearing HENT: OP clear, TM's clear, neck supple PULM: Crackles bases, normal percussion CV: RRR, gallop noted, mild JVD, notable pretibial edema GI: BS+, soft, nontender Derm: no cyanosis or rash Psyche: normal mood and affect   Chest x-ray from September 2016 images personally reviewed showing a large hiatal hernia, emphysema, otherwise no changes Pulmonary function testing summarized as above, mildly decreased compared to before but with significant air trapping and a low DLCO.      Assessment & Plan:  COPD (chronic obstructive pulmonary disease) with emphysema She continues to struggle with chest tightness and shortness of breath. This is due to her COPD which is worse compared to 2014. Her ongoing tobacco use continues to cause her lungs to deteriorate. However, it should be noted that her airflow obstruction is only moderate in severity. She does have significant air trapping which is consistent with her severe COPD.  Plan: Continue current inhaled therapy (Breo and Spiriva) Counseled today at length to quit smoking. Flu shot today  DOE (dyspnea on exertion) She  continues to complain of chest pain as well as shortness of breath. She did have a small area of indeterminate significance on her nuclear stress test which was felt to be related to her large hiatal hernia. However, she did describe to me the sensation of syncope during the test, though I cannot see documentation of this. She says that she felt like she blacked out during the stress portion of the test.  She continues to complain of shortness of breath. While I think that the COPD as the primary cause of her dyspnea I am concerned about the chest pain.  Plan: Refer to cardiology for further evaluation of the chest pain. Continue treatment for her COPD Counsel to quit smoking If no evidence of ischemic cardiac disease may need to consider pulmonary vasodilator therapy  Pulmonary hypertension ([place]) For now we need to continue focusing treatment on her COPD and the getting her to quit smoking. However, if she has had no evidence of ischemic disease after her cardiology evaluations and we may need to consider pulmonary vasodilator treatment because of her ongoing symptoms which seem to be progressive with only a moderate decline in her lung function testing.  Tobacco use disorder Counseled at length to quit smoking today.     Current outpatient prescriptions:  .  albuterol (PROAIR HFA) 108 (90 BASE) MCG/ACT inhaler, Inhale 2 puffs into the lungs 4 (four) times daily as needed. For shortness of breath , Disp: , Rfl:  .  amLODipine (NORVASC) 10 MG tablet, Take 1 tablet (10 mg total) by mouth daily., Disp: 90 tablet, Rfl: 3 .  cetirizine (ZYRTEC) 10 MG tablet, Take 10 mg by mouth as needed for allergies. , Disp: , Rfl:  .  Fluticasone Furoate-Vilanterol (BREO ELLIPTA) 100-25 MCG/INH AEPB, Inhale 1 puff into the lungs daily., Disp: 90 each, Rfl: 3 .  HYDROcodone-acetaminophen (NORCO) 10-325 MG per tablet, Take 1 tablet by mouth 2 (two) times daily. For pain, Disp: , Rfl:  .  LORazepam (ATIVAN) 1 MG  tablet, Take 1 mg by mouth every 8 (eight) hours.  , Disp: , Rfl:  .  montelukast (SINGULAIR) 10 MG tablet, Take 10 mg by mouth at bedtime., Disp: , Rfl:  .  Morphine-Naltrexone (EMBEDA) 80-3.2 MG CPCR, Take 60 mg by mouth 2 (two) times daily. , Disp: , Rfl:  .  nicotine (NICOTROL) 10 MG inhaler, Use 6 -10 cartriges a day for 4 weeks, then begin to decrease number of cartridges to zero over 6-12 weeks., Disp: 200 each, Rfl: 1 .  nitroGLYCERIN (NITROSTAT) 0.4 MG SL tablet, Place 1 tablet (0.4 mg total) under the tongue every 5 (five) minutes as needed for chest pain., Disp: 25 tablet, Rfl: 3 .  omeprazole (PRILOSEC) 20 MG capsule, Take 20 mg by mouth 2 (two) times daily.  , Disp: , Rfl:  .  OnabotulinumtoxinA (BOTOX IJ), Inject 80 Units as directed every 6 (six) months. , Disp: , Rfl:  .  Tiotropium Bromide Monohydrate (SPIRIVA RESPIMAT) 2.5 MCG/ACT AERS, Inhale 2 puffs into the lungs every morning., Disp: 3 Inhaler, Rfl: 2 .  zolpidem (AMBIEN CR) 12.5 MG CR tablet, Take 12.5 mg by mouth at bedtime.  , Disp: , Rfl:

## 2015-05-10 NOTE — Assessment & Plan Note (Signed)
She continues to complain of chest pain as well as shortness of breath. She did have a small area of indeterminate significance on her nuclear stress test which was felt to be related to her large hiatal hernia. However, she did describe to me the sensation of syncope during the test, though I cannot see documentation of this. She says that she felt like she blacked out during the stress portion of the test.  She continues to complain of shortness of breath. While I think that the COPD as the primary cause of her dyspnea I am concerned about the chest pain.  Plan: Refer to cardiology for further evaluation of the chest pain. Continue treatment for her COPD Counsel to quit smoking If no evidence of ischemic cardiac disease may need to consider pulmonary vasodilator therapy

## 2015-05-10 NOTE — Assessment & Plan Note (Signed)
For now we need to continue focusing treatment on her COPD and the getting her to quit smoking. However, if she has had no evidence of ischemic disease after her cardiology evaluations and we may need to consider pulmonary vasodilator treatment because of her ongoing symptoms which seem to be progressive with only a moderate decline in her lung function testing.

## 2015-05-10 NOTE — Patient Instructions (Signed)
Stop smoking Keep taking your inhaled medicines We will refer you to cardiology for the chest pain We will see you back in 3-4 months or sooner if needed

## 2015-05-14 ENCOUNTER — Encounter: Payer: Medicare Other | Admitting: Adult Health

## 2015-05-14 NOTE — Progress Notes (Signed)
Cardiology Office Note   Date:  05/14/2015   ID:  Kathleen Burns, DOB 01-Jan-1956, MRN [id number]  PCP:  Bronson Curb, PA-C  Cardiologist: Woodroe Chen, NP   No chief complaint on file. ERROR Cancelled Appointment

## 2015-05-21 ENCOUNTER — Encounter: Payer: Self-pay | Admitting: Adult Health

## 2015-05-21 ENCOUNTER — Ambulatory Visit (INDEPENDENT_AMBULATORY_CARE_PROVIDER_SITE_OTHER): Payer: Medicare Other | Admitting: Adult Health

## 2015-05-21 VITALS — BP 120/74 | HR 102 | Ht 66.0 in | Wt 185.4 lb

## 2015-05-21 DIAGNOSIS — I10 Essential (primary) hypertension: Secondary | ICD-10-CM

## 2015-05-21 DIAGNOSIS — R0789 Other chest pain: Secondary | ICD-10-CM

## 2015-05-21 DIAGNOSIS — R000 Tachycardia, unspecified: Secondary | ICD-10-CM

## 2015-05-21 DIAGNOSIS — E785 Hyperlipidemia, unspecified: Secondary | ICD-10-CM | POA: Diagnosis not present

## 2015-05-21 MED ORDER — AMLODIPINE BESYLATE 10 MG PO TABS: 10.0000 mg | ORAL_TABLET | Freq: Every day | ORAL | Status: DC

## 2015-05-21 MED ORDER — AMLODIPINE BESYLATE 5 MG PO TABS: 5.0000 mg | ORAL_TABLET | Freq: Every day | ORAL | Status: DC

## 2015-05-21 MED ORDER — METOPROLOL TARTRATE 25 MG PO TABS: 12.5000 mg | ORAL_TABLET | Freq: Two times a day (BID) | ORAL | Status: DC

## 2015-05-21 NOTE — Progress Notes (Deleted)
Name: Kathleen Burns    DOB: October 08, 1955  Age: 59 y.o.  MR#: [id number]       PCP:  Bronson Curb, PA-C      Insurance: Payor: Holley Bouche MEDICARE / Plan: [employer]TRA MEDICARE / Product Type: *No Product type* /   CC:   No chief complaint on file.   VS Filed Vitals:   05/21/15 1339  BP: 120/74  Pulse: 102  Height: '5\' 6"'$  (1.676 m)  Weight: 185 lb 6.4 oz (84.097 kg)  SpO2: 99%    Weights Current Weight  05/21/15 185 lb 6.4 oz (84.097 kg)  05/10/15 188 lb (85.276 kg)  03/13/15 185 lb (83.915 kg)    Blood Pressure  BP Readings from Last 3 Encounters:  05/21/15 120/74  05/10/15 130/86  03/06/15 130/74     Admit date:  (Not on file) Last encounter with RMR:  Visit date not found   Allergy Artane; Aspirin; Cogentin; Demerol; Inderal; Levofloxacin; Paxil; Procardia; Sulfa antibiotics; and Penicillins  Current Outpatient Prescriptions  Medication Sig Dispense Refill  . albuterol (PROAIR HFA) 108 (90 BASE) MCG/ACT inhaler Inhale 2 puffs into the lungs 4 (four) times daily as needed. For shortness of breath     . amLODipine (NORVASC) 10 MG tablet Take 1 tablet (10 mg total) by mouth daily. 30 tablet 6  . cetirizine (ZYRTEC) 10 MG tablet Take 10 mg by mouth as needed for allergies.     . Fluticasone Furoate-Vilanterol (BREO ELLIPTA) 100-25 MCG/INH AEPB Inhale 1 puff into the lungs daily. 90 each 3  . HYDROcodone-acetaminophen (NORCO) 10-325 MG per tablet Take 1 tablet by mouth 2 (two) times daily. For pain    . LORazepam (ATIVAN) 1 MG tablet Take 1 mg by mouth every 8 (eight) hours.      . montelukast (SINGULAIR) 10 MG tablet Take 10 mg by mouth at bedtime.    . Morphine-Naltrexone (EMBEDA) 80-3.2 MG CPCR Take 60 mg by mouth 2 (two) times daily.     . nitroGLYCERIN (NITROSTAT) 0.4 MG SL tablet Place 1 tablet (0.4 mg total) under the tongue every 5 (five) minutes as needed for chest pain. 25 tablet 3  . omeprazole (PRILOSEC) 20 MG capsule Take 20 mg by mouth 2 (two) times  daily.      . Tiotropium Bromide Monohydrate (SPIRIVA RESPIMAT) 2.5 MCG/ACT AERS Inhale 2 puffs into the lungs every morning. 3 Inhaler 2  . zolpidem (AMBIEN CR) 12.5 MG CR tablet Take 12.5 mg by mouth at bedtime.       No current facility-administered medications for this visit.    Discontinued Meds:    Medications Discontinued During This Encounter  Medication Reason  . nicotine (NICOTROL) 10 MG inhaler Error  . OnabotulinumtoxinA (BOTOX IJ) Error  . amLODipine (NORVASC) 10 MG tablet Reorder    Patient Active Problem List   Diagnosis Date Noted  . Tobacco use disorder 03/06/2015  . COPD exacerbation ([place]) 06/21/2013  . COPD (chronic obstructive pulmonary disease) with emphysema ([place]) 11/15/2012  . Chronic diastolic heart failure ([place]) 09/23/2012  . DOE (dyspnea on exertion) 08/31/2012  . Pulmonary hypertension ([place]) 06/17/2012  . Hyperkalemia 03/05/2012  . Chronic kidney disease 01/28/2012  . Chest pain   . Hypertension   . Meige disease (lymphedema praecox)   . Breast cancer ([place])   . Hyperlipidemia   . Anxiety and depression   . Tobacco abuse   . Obesity     LABS    Component Value Date/Time   NA  135 10/25/2014 1500   NA 137 04/02/2012 1636   NA 138 03/02/2012 1615   K 4.6 10/25/2014 1500   K 5.1 04/02/2012 1636   K 5.7* 03/02/2012 1615   CL 102 10/25/2014 1500   CL 98 04/02/2012 1636   CL 106 03/02/2012 1615   CO2 21 10/25/2014 1500   CO2 28 04/02/2012 1636   CO2 27 03/02/2012 1615   GLUCOSE 150* 10/25/2014 1500   GLUCOSE 90 04/02/2012 1636   GLUCOSE 93 03/02/2012 1615   BUN 28* 10/25/2014 1500   BUN 22 04/02/2012 1636   BUN 21 03/02/2012 1615   CREATININE 1.13* 10/25/2014 1500   CREATININE 1.20* 10/16/2014 0837   CREATININE 1.09 04/02/2012 1636   CREATININE 1.00 03/02/2012 1615   CALCIUM 9.7 10/25/2014 1500   CALCIUM 10.3 04/02/2012 1636   CALCIUM 10.1 03/02/2012 1615   GFRNONAA 52* 10/25/2014 1500   GFRAA 60* 10/25/2014 1500   CMP      Component Value Date/Time   NA 135 10/25/2014 1500   K 4.6 10/25/2014 1500   CL 102 10/25/2014 1500   CO2 21 10/25/2014 1500   GLUCOSE 150* 10/25/2014 1500   BUN 28* 10/25/2014 1500   CREATININE 1.13* 10/25/2014 1500   CREATININE 1.09 04/02/2012 1636   CALCIUM 9.7 10/25/2014 1500   PROT 6.8 03/02/2012 1615   ALBUMIN 4.2 03/02/2012 1615   AST 13 03/02/2012 1615   ALT <8 03/02/2012 1615   ALKPHOS 76 03/02/2012 1615   BILITOT 0.4 03/02/2012 1615   GFRNONAA 52* 10/25/2014 1500   GFRAA 60* 10/25/2014 1500       Component Value Date/Time   WBC 11.8* 03/06/2015 1607   WBC 10.9* 10/25/2014 1500   WBC 8.4 03/02/2012 1615   HGB 15.2* 03/06/2015 1607   HGB 15.3* 10/25/2014 1500   HGB 13.6 03/02/2012 1615   HCT 45.8 03/06/2015 1607   HCT 46.2* 10/25/2014 1500   HCT 40.2 03/02/2012 1615   MCV 98.3 03/06/2015 1607   MCV 100.0 10/25/2014 1500   MCV 96.9 03/02/2012 1615    Lipid Panel     Component Value Date/Time   CHOL 241* 06/16/2012 1625   TRIG 176* 06/16/2012 1625   HDL 57 06/16/2012 1625   CHOLHDL 4.2 06/16/2012 1625   VLDL 35 06/16/2012 1625   LDLCALC 149* 06/16/2012 1625    ABG    Component Value Date/Time   PHART 7.389 10/29/2012 0915   PCO2ART 37.7 10/29/2012 0915   PO2ART 53.0* 10/29/2012 0915   HCO3 25.8* 10/29/2012 0925   TCO2 27 10/29/2012 0925   ACIDBASEDEF 4.0* 10/29/2012 0919   O2SAT 50.0 10/29/2012 0925     Lab Results  Component Value Date   TSH 1.496 03/02/2012   BNP (last 3 results) No results for input(s): BNP in the last 8760 hours.  ProBNP (last 3 results)  Recent Labs  03/06/15 1607  PROBNP 28.0    Cardiac Panel (last 3 results) No results for input(s): CKTOTAL, CKMB, TROPONINI, RELINDX in the last 72 hours.  Iron/TIBC/Ferritin/ %Sat No results found for: IRON, TIBC, FERRITIN, IRONPCTSAT   EKG Orders placed or performed during the hospital encounter of 10/27/14  . EKG 12-Lead  . EKG 12-Lead     Prior Assessment and  Plan Problem List as of 05/21/2015      Cardiovascular and Mediastinum   Hypertension   Last Assessment & Plan 10/10/2013 Office Visit Edited 10/10/2013  1:48 PM by Lendon Colonel, NP    Well controlled  currently after taking herself off of the metoprolol.She is to continue amlodipine as directed. Heart rate is still elevated when she stopped the metoprolol, but better on it. She states that she feels too sluggish and light headed when taking it. I believe that some of this is the nicotine use contributing to HR along with her COPD. Will see her again in 6 months.      Pulmonary hypertension [facility])   Last Assessment & Plan 05/10/2015 Office Visit Written 05/10/2015  4:03 PM by Juanito Doom, MD    For now we need to continue focusing treatment on her COPD and the getting her to quit smoking. However, if she has had no evidence of ischemic disease after her cardiology evaluations and we may need to consider pulmonary vasodilator treatment because of her ongoing symptoms which seem to be progressive with only a moderate decline in her lung function testing.      Chronic diastolic heart failure [facility])   Last Assessment & Plan 10/10/2013 Office Visit Written 10/10/2013  1:44 PM by Lendon Colonel, NP    Does not appear to be fluid overloaded at present. She is unable to tolerate the metoprolol due to hypotension and fatigue.        Respiratory   COPD (chronic obstructive pulmonary disease) with emphysema [facility])   Last Assessment & Plan 05/10/2015 Office Visit Edited 05/10/2015  4:01 PM by Juanito Doom, MD    She continues to struggle with chest tightness and shortness of breath. This is due to her COPD which is worse compared to 2014. Her ongoing tobacco use continues to cause her lungs to deteriorate. However, it should be noted that her airflow obstruction is only moderate in severity. She does have significant air trapping which is consistent with her severe COPD.  Plan: Continue  current inhaled therapy (Breo and Spiriva) Counseled today at length to quit smoking. Flu shot today      COPD exacerbation [facility])   Last Assessment & Plan 08/15/2014 Office Visit Written 08/15/2014  2:11 PM by Kathee Delton, MD    The patient has increasing wheezing, congestion, and increased shortness of breath that is all consistent with an acute COPD exacerbation. She does not think that she has a chest cold, and is not bringing up purulent mucus. Will treat her with a course of steroids, but I stressed to her the importance of total smoking cessation.        Genitourinary   Chronic kidney disease   Last Assessment & Plan 03/05/2012 Office Visit Written 03/05/2012  3:44 PM by Yehuda Savannah, MD    Renal function has normalized with adjustment of antihypertensive medications.  Asymptomatic urinary tract infection will be treated with a 3 day course of antibiotics.  Organism was Escherichia coli and was pansensitive.        Other   Chest pain   Last Assessment & Plan 07/16/2012 Office Visit Written 07/16/2012  5:17 PM by Lendon Colonel, NP    Doubt cardiac etiology of chest discomfort. May be broncho spams, or esophageal spams, which she has experienced in the past. She states that she was told she had coronary spams and had been placed on nitrates but stopped taking them. I have asked her to quit smoking and drinking so much caffeine to help with over stimulation which could possibly cause her symptoms as well. I have provided her a Rx for NTG 0.4 mg to use prn. Last cath 5 years ago  is reassuring and will not pursue more testing at this time until pulmonary evaluation is complete. Anxiety can also be playing a role in her chest pain as it is relieved with use of ativan.      Meige disease (lymphedema praecox)   Breast cancer [facility])   Hyperlipidemia   Last Assessment & Plan 06/17/2012 Office Visit Written 06/17/2012  4:50 PM by Lendon Colonel, NP    Total cholesterol was 241, HDL  57, triglycerides 176, LDL 149. Will place her on Lipitor 40 mg 1 by mouth at at bedtime. Left followup lipids and LFTs in 6 weeks. She has been advised a low-cholesterol diet. With comparison to prior cholesterol studies completed in October 2013. She has had a worsening of her total cholesterol and LDL      Anxiety and depression   Last Assessment & Plan 10/10/2013 Office Visit Written 10/10/2013  1:46 PM by Lendon Colonel, NP    She is to follow with PCP for this.       Tobacco abuse   Last Assessment & Plan 10/10/2013 Office Visit Written 10/10/2013  1:43 PM by Lendon Colonel, NP    Despite lengthy and numerous conversations on cessation, she will not quit smoking. She is aware of the risks and it upsets her, but she still continues to smoke.  I have wished her well with this, but continue my recommendations.      Obesity   Hyperkalemia   Last Assessment & Plan 03/05/2012 Office Visit Written 03/12/2012  8:25 AM by Yehuda Savannah, MD    Potassium restriction will be added to her diet and chemistry profile reassessed in one month.      DOE (dyspnea on exertion)   Last Assessment & Plan 05/10/2015 Office Visit Written 05/10/2015  4:02 PM by Juanito Doom, MD    She continues to complain of chest pain as well as shortness of breath. She did have a small area of indeterminate significance on her nuclear stress test which was felt to be related to her large hiatal hernia. However, she did describe to me the sensation of syncope during the test, though I cannot see documentation of this. She says that she felt like she blacked out during the stress portion of the test.  She continues to complain of shortness of breath. While I think that the COPD as the primary cause of her dyspnea I am concerned about the chest pain.  Plan: Refer to cardiology for further evaluation of the chest pain. Continue treatment for her COPD Counsel to quit smoking If no evidence of ischemic cardiac  disease may need to consider pulmonary vasodilator therapy      Tobacco use disorder   Last Assessment & Plan 05/10/2015 Office Visit Written 05/10/2015  4:03 PM by Juanito Doom, MD    Counseled at length to quit smoking today.          Imaging: No results found.

## 2015-05-21 NOTE — Patient Instructions (Addendum)
Your physician recommends that you schedule a follow-up appointment in: 1 Month with Jory Sims, NP   You have been given samples of Ranexa 500 mg Take 1 Tablet Two Times Daily   Start Lopressor 12.5 mg Take 1/2 Tablet Two Times Daily  Decrease Amlodipine (Norvasc) to 5 mg Daily   If you need a refill on your cardiac medications before your next appointment, please call your pharmacy.  Thank you for choosing [facility]!

## 2015-05-21 NOTE — Progress Notes (Signed)
Cardiology Office Note   Date:  05/21/2015   ID:  Kathleen Burns, DOB February 28, 1956, MRN [id number]  PCP:  Bronson Curb, PA-C  Cardiologist: Woodroe Chen, NP   Chief Complaint  Patient presents with  . Shortness of Breath      History of Present Illness: Kathleen Burns is a 59 y.o. female who presents for ongoing assessment and management of hypertension, hyperlipidemia, anxiety, COPD, tobacco abuse with chronic dyspnea, and nonobstructive coronary artery disease. She has a history of palpitations and metoprolol was previously attempted but she did not tolerate it, causing her to experience dizziness and lightheadedness.She was placed on Ranxea 500 mg BID, with decreased dose of amlodipine to 5 mg daily.   She has not been seen for one year and is having recurrent chest pain. She had a NM study in Sept of 2016, that was low risk. She continues to smoke 2 ppd of cigarettes. She has not been taking Ranexa as she ran out of samples. She is tachycardic.    Past Medical History  Diagnosis Date  . COPD (chronic obstructive pulmonary disease) ([place])   . Chest pain     Cardiac catheterization in 1999, 2003, 2008-normal EF, normal coronaries  . GERD (gastroesophageal reflux disease)   . Hypertension   . Meige disease (lymphedema praecox)     blepharospasm; followed at [facility] And treated with botulinum toxin injections  . Osteoarthritis   . Breast cancer ([place])     Left mastectomy in 1990s + chemotherapy  . Hyperlipidemia   . Anxiety and depression     H/o suicide attempt  . Skull fracture [facility]) 1969    surgical repair in 1969  . Hearing impairment   . Tobacco abuse     45 pack years  . Obesity   . Pulmonary hypertension [facility])     Past Surgical History  Procedure Laterality Date  . Mastectomy  1990    Left  . Back surgery  1998 and  . Eye surgery  1999, 2001  . Skull fracture elevation  1969  . Carpal tunnel release  1996  . Shoulder surgery  1996    Left  .  Knee surgery  1999    Left  . Cholecystectomy  1996  . Total abdominal hysterectomy w/ bilateral salpingoophorectomy  1994  . Tonsillectomy    . Colonoscopy  2011    No significant abnormalities  . Incisional hernia repair N/A 10/27/2014    Procedure: Fatima Blank HERNIORRHAPHY WITH MESH;  Surgeon: Aviva Signs Md, MD;  Location: [facility]S;  Service: General;  Laterality: N/A;  . Insertion of mesh N/A 10/27/2014    Procedure: INSERTION OF MESH;  Surgeon: Aviva Signs Md, MD;  Location: [facility];  Service: General;  Laterality: N/A;     Current Outpatient Prescriptions  Medication Sig Dispense Refill  . albuterol (PROAIR HFA) 108 (90 BASE) MCG/ACT inhaler Inhale 2 puffs into the lungs 4 (four) times daily as needed. For shortness of breath     . cetirizine (ZYRTEC) 10 MG tablet Take 10 mg by mouth as needed for allergies.     . Fluticasone Furoate-Vilanterol (BREO ELLIPTA) 100-25 MCG/INH AEPB Inhale 1 puff into the lungs daily. 90 each 3  . HYDROcodone-acetaminophen (NORCO) 10-325 MG per tablet Take 1 tablet by mouth 2 (two) times daily. For pain    . LORazepam (ATIVAN) 1 MG tablet Take 1 mg by mouth every 8 (eight) hours.      . montelukast (SINGULAIR) 10 MG  tablet Take 10 mg by mouth at bedtime.    . Morphine-Naltrexone (EMBEDA) 80-3.2 MG CPCR Take 60 mg by mouth 2 (two) times daily.     . nitroGLYCERIN (NITROSTAT) 0.4 MG SL tablet Place 1 tablet (0.4 mg total) under the tongue every 5 (five) minutes as needed for chest pain. 25 tablet 3  . omeprazole (PRILOSEC) 20 MG capsule Take 20 mg by mouth 2 (two) times daily.      . Tiotropium Bromide Monohydrate (SPIRIVA RESPIMAT) 2.5 MCG/ACT AERS Inhale 2 puffs into the lungs every morning. 3 Inhaler 2  . zolpidem (AMBIEN CR) 12.5 MG CR tablet Take 12.5 mg by mouth at bedtime.      Marland Kitchen amLODipine (NORVASC) 5 MG tablet Take 1 tablet (5 mg total) by mouth daily. 30 tablet 11  . metoprolol tartrate (LOPRESSOR) 25 MG tablet Take 0.5 tablets (12.5 mg total) by  mouth 2 (two) times daily. 30 tablet 1   No current facility-administered medications for this visit.    Allergies:   Artane; Aspirin; Cogentin; Demerol; Inderal; Levofloxacin; Paxil; Procardia; Sulfa antibiotics; and Penicillins    Social History:  The patient  reports that she has been smoking Cigarettes.  She has a 60 pack-year smoking history. She has never used smokeless tobacco. She reports that she does not drink alcohol or use illicit drugs.   Family History:  The patient's family history includes Coronary artery disease (age of onset: 72) in an other family member; Diabetes in an other family member; Hypertension in an other family member.    ROS: All other systems are reviewed and negative. Unless otherwise mentioned in H&P    PHYSICAL EXAM: VS:  BP 120/74 mmHg  Pulse 102  Ht '5\' 6"'$  (1.676 m)  Wt 185 lb 6.4 oz (84.097 kg)  BMI 29.94 kg/m2  SpO2 99% , BMI Body mass index is 29.94 kg/(m^2). GEN: Well nourished, well developed, in no acute distress HEENT: normal Neck: no JVD, carotid bruits, or masses Cardiac:RRR; tachycardic,. no murmurs, rubs, or gallops,no edema  Respiratory:  clear to auscultation bilaterally, normal work of breathing GI: soft, nontender, nondistended, + BS MS: no deformity or atrophy Skin: warm and dry, no rash Neuro:  Strength and sensation are intact Psych: euthymic mood, full affect  Recent Labs: 10/25/2014: BUN 28*; Creatinine, Ser 1.13*; Potassium 4.6; Sodium 135 03/06/2015: Hemoglobin 15.2*; Platelets 362.0; Pro B Natriuretic peptide (BNP) 28.0    Lipid Panel    Component Value Date/Time   CHOL 241* 06/16/2012 1625   TRIG 176* 06/16/2012 1625   HDL 57 06/16/2012 1625   CHOLHDL 4.2 06/16/2012 1625   VLDL 35 06/16/2012 1625   LDLCALC 149* 06/16/2012 1625      Wt Readings from Last 3 Encounters:  05/21/15 185 lb 6.4 oz (84.097 kg)  05/10/15 188 lb (85.276 kg)  03/13/15 185 lb (83.915 kg)      ASSESSMENT AND PLAN:  1. Chest Pain:  States that Ranexa may have helped but is has been a long time since she took it, as she did not have a Rx for it. Will try this again, giving samples of 500 mg BID. If this is helpful will send a Rx. She will be seen in one year.   2. Tachycardia: Likely related to steroid inhalers, cigarettes and anxiety. She will be started on a low dose of metoprolol 12.5 mg BID. She is not wheezing. Do not want to place her on higher dose. Will decrease amlodipine to 5 mg to  avoid hypotension.   3.COPD: She is continued to be followed by Dr. Lake Bells. Smoking cessation is strongly recommended.    Current medicines are reviewed at length with the patient today.    Labs/ tests ordered today include: No orders of the defined types were placed in this encounter.     Disposition:   FU with 1 month   Signed, Jory Sims, NP  05/21/2015 3:15 PM    [facility] [address] [place], [place] [address] Phone: [phone]; Fax: [phone]

## 2015-05-30 ENCOUNTER — Encounter: Payer: Self-pay | Admitting: Pulmonary Disease

## 2015-05-30 NOTE — Telephone Encounter (Signed)
BQ - please advise. Thanks. 

## 2015-05-31 NOTE — Telephone Encounter (Signed)
I have sent Judson Roch a message. Will await response from her.

## 2015-05-31 NOTE — Telephone Encounter (Signed)
Per BQ >> this is fine to order. Eric Form needs to contact her. She has to be 24 years or older.

## 2015-06-06 ENCOUNTER — Encounter: Payer: Self-pay | Admitting: Pulmonary Disease

## 2015-06-06 DIAGNOSIS — Z720 Tobacco use: Secondary | ICD-10-CM

## 2015-06-06 DIAGNOSIS — J438 Other emphysema: Secondary | ICD-10-CM

## 2015-06-06 NOTE — Telephone Encounter (Signed)
(  patient email)  I was just wondering has Dr Lake Bells made a decision on the low dose ct-scan thank you Kiana   BQ please advise on low dose ct.  Thanks!

## 2015-06-08 ENCOUNTER — Telehealth: Payer: Self-pay | Admitting: Acute Care

## 2015-06-08 NOTE — Telephone Encounter (Signed)
I discussed this pt with Clarise Cruz, who says pt does in fact qualify for ldct.  Referral to sara has been placed.  Pt aware.  Nothing further needed.

## 2015-06-08 NOTE — Telephone Encounter (Signed)
Message left when there was no answer for patient to return my call. Contact information given.Will await return call.

## 2015-06-18 ENCOUNTER — Encounter: Payer: Self-pay | Admitting: Pulmonary Disease

## 2015-06-18 NOTE — Telephone Encounter (Signed)
Dr. Lake Bells, please advise.

## 2015-06-21 ENCOUNTER — Encounter: Payer: Medicare Other | Admitting: Adult Health

## 2015-06-21 NOTE — Progress Notes (Signed)
ERROR rescheudled

## 2015-06-26 ENCOUNTER — Ambulatory Visit (INDEPENDENT_AMBULATORY_CARE_PROVIDER_SITE_OTHER): Payer: Medicare Other | Admitting: Adult Health

## 2015-06-26 ENCOUNTER — Encounter: Payer: Self-pay | Admitting: Adult Health

## 2015-06-26 VITALS — BP 116/74 | HR 84 | Ht 66.0 in | Wt 190.0 lb

## 2015-06-26 DIAGNOSIS — I2510 Atherosclerotic heart disease of native coronary artery without angina pectoris: Secondary | ICD-10-CM

## 2015-06-26 DIAGNOSIS — Z720 Tobacco use: Secondary | ICD-10-CM

## 2015-06-26 DIAGNOSIS — I10 Essential (primary) hypertension: Secondary | ICD-10-CM | POA: Diagnosis not present

## 2015-06-26 MED ORDER — METOPROLOL TARTRATE 25 MG PO TABS: 12.5000 mg | ORAL_TABLET | Freq: Two times a day (BID) | ORAL | Status: DC

## 2015-06-26 MED ORDER — RANOLAZINE ER 500 MG PO TB12: 500.0000 mg | ORAL_TABLET | Freq: Two times a day (BID) | ORAL | Status: DC

## 2015-06-26 NOTE — Progress Notes (Signed)
Cardiology Office Note   Date:  06/26/2015   ID:  Kathleen Burns, DOB 05-Feb-1956, MRN [id number]  PCP:  Bronson Curb, PA-C  Cardiologist: Woodroe Chen, NP   Chief Complaint  Patient presents with  . Hypertension  . Coronary Artery Disease      History of Present Illness: Kathleen Burns is a 59 y.o. female who presents for ongoing assessment and management of hypertension, hyperlipidemia, anxiety, COPD, tobacco abuse with chronic dyspnea, and nonobstructive coronary artery disease. She has a history of palpitations and metoprolol was previously attempted but she did not tolerate it, causing her to experience dizziness and lightheadedness.She was placed on Ranxea 500 mg BID, with decreased dose of amlodipine to 5 mg daily.   She comes today without cardiac complaints. She continues to have worsening dyspnea. Her partner states she could not finish cooking a meal this week because she became short of breath and fatigued. She has been offered O2 supplementation but has refused this. She continues to smoke and states she sleep walks. She is afraid of causing a fire with the O2 and cigarettes. She has no plans to quit smoking.   Past Medical History  Diagnosis Date  . COPD (chronic obstructive pulmonary disease) ([place])   . Chest pain     Cardiac catheterization in 1999, 2003, 2008-normal EF, normal coronaries  . GERD (gastroesophageal reflux disease)   . Hypertension   . Meige disease (lymphedema praecox)     blepharospasm; followed at [facility] And treated with botulinum toxin injections  . Osteoarthritis   . Breast cancer ([place])     Left mastectomy in 1990s + chemotherapy  . Hyperlipidemia   . Anxiety and depression     H/o suicide attempt  . Skull fracture [facility]) 1969    surgical repair in 1969  . Hearing impairment   . Tobacco abuse     45 pack years  . Obesity   . Pulmonary hypertension [facility])     Past Surgical History  Procedure Laterality Date  . Mastectomy   1990    Left  . Back surgery  1998 and  . Eye surgery  1999, 2001  . Skull fracture elevation  1969  . Carpal tunnel release  1996  . Shoulder surgery  1996    Left  . Knee surgery  1999    Left  . Cholecystectomy  1996  . Total abdominal hysterectomy w/ bilateral salpingoophorectomy  1994  . Tonsillectomy    . Colonoscopy  2011    No significant abnormalities  . Incisional hernia repair N/A 10/27/2014    Procedure: Fatima Blank HERNIORRHAPHY WITH MESH;  Surgeon: Aviva Signs Md, MD;  Location: [facility] ORS;  Service: General;  Laterality: N/A;  . Insertion of mesh N/A 10/27/2014    Procedure: INSERTION OF MESH;  Surgeon: Aviva Signs Md, MD;  Location: [facility] ORS;  Service: General;  Laterality: N/A;     Current Outpatient Prescriptions  Medication Sig Dispense Refill  . albuterol (PROAIR HFA) 108 (90 BASE) MCG/ACT inhaler Inhale 2 puffs into the lungs 4 (four) times daily as needed. For shortness of breath     . amLODipine (NORVASC) 5 MG tablet Take 1 tablet (5 mg total) by mouth daily. 30 tablet 11  . cetirizine (ZYRTEC) 10 MG tablet Take 10 mg by mouth as needed for allergies.     . Fluticasone Furoate-Vilanterol (BREO ELLIPTA) 100-25 MCG/INH AEPB Inhale 1 puff into the lungs daily. 90 each 3  . HYDROcodone-acetaminophen (NORCO) 10-325  MG per tablet Take 1 tablet by mouth 2 (two) times daily. For pain    . LORazepam (ATIVAN) 1 MG tablet Take 1 mg by mouth every 8 (eight) hours.      . metoprolol tartrate (LOPRESSOR) 25 MG tablet Take 0.5 tablets (12.5 mg total) by mouth 2 (two) times daily. 30 tablet 11  . montelukast (SINGULAIR) 10 MG tablet Take 10 mg by mouth at bedtime.    . Morphine-Naltrexone (EMBEDA) 80-3.2 MG CPCR Take 60 mg by mouth 2 (two) times daily.     . nitroGLYCERIN (NITROSTAT) 0.4 MG SL tablet Place 1 tablet (0.4 mg total) under the tongue every 5 (five) minutes as needed for chest pain. 25 tablet 3  . omeprazole (PRILOSEC) 20 MG capsule Take 20 mg by mouth 2 (two) times  daily.      . Tiotropium Bromide Monohydrate (SPIRIVA RESPIMAT) 2.5 MCG/ACT AERS Inhale 2 puffs into the lungs every morning. 3 Inhaler 2  . zolpidem (AMBIEN CR) 12.5 MG CR tablet Take 12.5 mg by mouth at bedtime.      . ranolazine (RANEXA) 500 MG 12 hr tablet Take 1 tablet (500 mg total) by mouth 2 (two) times daily. 60 tablet 11   No current facility-administered medications for this visit.    Allergies:   Artane; Aspirin; Cogentin; Demerol; Inderal; Levofloxacin; Paxil; Procardia; Sulfa antibiotics; and Penicillins    Social History:  The patient  reports that she has been smoking Cigarettes.  She has a 60 pack-year smoking history. She has never used smokeless tobacco. She reports that she does not drink alcohol or use illicit drugs.   Family History:  The patient's family history is not on file.    ROS: All other systems are reviewed and negative. Unless otherwise mentioned in H&P    PHYSICAL EXAM: VS:  BP 116/74 mmHg  Pulse 84  Ht '5\' 6"'$  (1.676 m)  Wt 190 lb (86.183 kg)  BMI 30.68 kg/m2  SpO2 94% , BMI Body mass index is 30.68 kg/(m^2). GEN: Well nourished, well developed, in no acute distress HEENT: normal Neck: no JVD, carotid bruits, or masses Cardiac: RRR; no murmurs, rubs, or gallops,no edema  Respiratory: Bilateral crackles without wheezes.  GI: soft, nontender, nondistended, + BS MS: no deformity or atrophy Skin: warm and dry, no rash Neuro:  Strength and sensation are intact Psych: euthymic mood, full affect   EKG:   The ekg ordered today demonstrates    Recent Labs: 10/25/2014: BUN 28*; Creatinine, Ser 1.13*; Potassium 4.6; Sodium 135 03/06/2015: Hemoglobin 15.2*; Platelets 362.0; Pro B Natriuretic peptide (BNP) 28.0    Lipid Panel    Component Value Date/Time   CHOL 241* 06/16/2012 1625   TRIG 176* 06/16/2012 1625   HDL 57 06/16/2012 1625   CHOLHDL 4.2 06/16/2012 1625   VLDL 35 06/16/2012 1625   LDLCALC 149* 06/16/2012 1625      Wt Readings from  Last 3 Encounters:  06/26/15 190 lb (86.183 kg)  05/21/15 185 lb 6.4 oz (84.097 kg)  05/10/15 188 lb (85.276 kg)        ASSESSMENT AND PLAN:  1. Hypertension: Essentially well controlled. She is without complaints with the exception of fatigue.   2. COPD: Being offered O2 by pulmonology. She is considering it but wants to wait until she gets worse.   3.Ongoing tobacco abuse: Main contributor to her overall breathing status and continued substantial risk factor. She is not going to quit despite frequent counseling.   4. Non-obstructive  CAD: Most recent MPI study in 03/2015 demonstrated low risk study without ST/T wave abnormality. Continue metoprolol at low dose and Ranexa 500 mg BID. She is allergic to ASA. .    Current medicines are reviewed at length with the patient today.    Labs/ tests ordered today include: None No orders of the defined types were placed in this encounter.     Disposition:   FU with 6 months.   Signed, Jory Sims, NP  06/26/2015 3:16 PM    [facility] [address] [place], [place] [address] Phone: [phone]; Fax: [phone]

## 2015-06-26 NOTE — Patient Instructions (Addendum)
Your physician wants you to follow-up in: 6 MONTHS with Dr. Bronson Ing.  You will receive a reminder letter in the mail two months in advance. If you don't receive a letter, please call our office to schedule the follow-up appointment.  Your physician recommends that you continue on your current medications as directed. Please refer to the Current Medication list given to you today.  If you need a refill on your cardiac medications before your next appointment, please call your pharmacy.  Thank you for choosing [facility]!

## 2015-06-26 NOTE — Progress Notes (Signed)
Name: Kathleen Burns    DOB: Sep 12, 1955  Age: 59 y.o.  MR#: [id number]       PCP:  Bronson Curb, PA-C      Insurance: Payor: Holley Bouche MEDICARE / Plan: [employer]TRA MEDICARE / Product Type: *No Product type* /   CC:   No chief complaint on file.   VS Filed Vitals:   06/26/15 1440  BP: 116/74  Pulse: 84  Height: '5\' 6"'$  (1.676 m)  Weight: 190 lb (86.183 kg)  SpO2: 94%    Weights Current Weight  06/26/15 190 lb (86.183 kg)  05/21/15 185 lb 6.4 oz (84.097 kg)  05/10/15 188 lb (85.276 kg)    Blood Pressure  BP Readings from Last 3 Encounters:  06/26/15 116/74  05/21/15 120/74  05/10/15 130/86     Admit date:  (Not on file) Last encounter with RMR:  05/21/2015   Allergy Artane; Aspirin; Cogentin; Demerol; Inderal; Levofloxacin; Paxil; Procardia; Sulfa antibiotics; and Penicillins  Current Outpatient Prescriptions  Medication Sig Dispense Refill  . albuterol (PROAIR HFA) 108 (90 BASE) MCG/ACT inhaler Inhale 2 puffs into the lungs 4 (four) times daily as needed. For shortness of breath     . amLODipine (NORVASC) 5 MG tablet Take 1 tablet (5 mg total) by mouth daily. 30 tablet 11  . cetirizine (ZYRTEC) 10 MG tablet Take 10 mg by mouth as needed for allergies.     . Fluticasone Furoate-Vilanterol (BREO ELLIPTA) 100-25 MCG/INH AEPB Inhale 1 puff into the lungs daily. 90 each 3  . HYDROcodone-acetaminophen (NORCO) 10-325 MG per tablet Take 1 tablet by mouth 2 (two) times daily. For pain    . LORazepam (ATIVAN) 1 MG tablet Take 1 mg by mouth every 8 (eight) hours.      . metoprolol tartrate (LOPRESSOR) 25 MG tablet Take 0.5 tablets (12.5 mg total) by mouth 2 (two) times daily. 30 tablet 1  . montelukast (SINGULAIR) 10 MG tablet Take 10 mg by mouth at bedtime.    . Morphine-Naltrexone (EMBEDA) 80-3.2 MG CPCR Take 60 mg by mouth 2 (two) times daily.     . nitroGLYCERIN (NITROSTAT) 0.4 MG SL tablet Place 1 tablet (0.4 mg total) under the tongue every 5 (five) minutes as needed  for chest pain. 25 tablet 3  . omeprazole (PRILOSEC) 20 MG capsule Take 20 mg by mouth 2 (two) times daily.      . Tiotropium Bromide Monohydrate (SPIRIVA RESPIMAT) 2.5 MCG/ACT AERS Inhale 2 puffs into the lungs every morning. 3 Inhaler 2  . zolpidem (AMBIEN CR) 12.5 MG CR tablet Take 12.5 mg by mouth at bedtime.       No current facility-administered medications for this visit.    Discontinued Meds:   There are no discontinued medications.  Patient Active Problem List   Diagnosis Date Noted  . Tobacco use disorder 03/06/2015  . COPD exacerbation ([place]) 06/21/2013  . COPD (chronic obstructive pulmonary disease) with emphysema ([place]) 11/15/2012  . Chronic diastolic heart failure ([place]) 09/23/2012  . DOE (dyspnea on exertion) 08/31/2012  . Pulmonary hypertension ([place]) 06/17/2012  . Hyperkalemia 03/05/2012  . Chronic kidney disease 01/28/2012  . Chest pain   . Hypertension   . Meige disease (lymphedema praecox)   . Breast cancer ([place])   . Hyperlipidemia   . Anxiety and depression   . Tobacco abuse   . Obesity     LABS    Component Value Date/Time   NA 135 10/25/2014 1500   NA 137 04/02/2012 1636  NA 138 03/02/2012 1615   K 4.6 10/25/2014 1500   K 5.1 04/02/2012 1636   K 5.7* 03/02/2012 1615   CL 102 10/25/2014 1500   CL 98 04/02/2012 1636   CL 106 03/02/2012 1615   CO2 21 10/25/2014 1500   CO2 28 04/02/2012 1636   CO2 27 03/02/2012 1615   GLUCOSE 150* 10/25/2014 1500   GLUCOSE 90 04/02/2012 1636   GLUCOSE 93 03/02/2012 1615   BUN 28* 10/25/2014 1500   BUN 22 04/02/2012 1636   BUN 21 03/02/2012 1615   CREATININE 1.13* 10/25/2014 1500   CREATININE 1.20* 10/16/2014 0837   CREATININE 1.09 04/02/2012 1636   CREATININE 1.00 03/02/2012 1615   CALCIUM 9.7 10/25/2014 1500   CALCIUM 10.3 04/02/2012 1636   CALCIUM 10.1 03/02/2012 1615   GFRNONAA 52* 10/25/2014 1500   GFRAA 60* 10/25/2014 1500   CMP     Component Value Date/Time   NA 135 10/25/2014 1500   K 4.6  10/25/2014 1500   CL 102 10/25/2014 1500   CO2 21 10/25/2014 1500   GLUCOSE 150* 10/25/2014 1500   BUN 28* 10/25/2014 1500   CREATININE 1.13* 10/25/2014 1500   CREATININE 1.09 04/02/2012 1636   CALCIUM 9.7 10/25/2014 1500   PROT 6.8 03/02/2012 1615   ALBUMIN 4.2 03/02/2012 1615   AST 13 03/02/2012 1615   ALT <8 03/02/2012 1615   ALKPHOS 76 03/02/2012 1615   BILITOT 0.4 03/02/2012 1615   GFRNONAA 52* 10/25/2014 1500   GFRAA 60* 10/25/2014 1500       Component Value Date/Time   WBC 11.8* 03/06/2015 1607   WBC 10.9* 10/25/2014 1500   WBC 8.4 03/02/2012 1615   HGB 15.2* 03/06/2015 1607   HGB 15.3* 10/25/2014 1500   HGB 13.6 03/02/2012 1615   HCT 45.8 03/06/2015 1607   HCT 46.2* 10/25/2014 1500   HCT 40.2 03/02/2012 1615   MCV 98.3 03/06/2015 1607   MCV 100.0 10/25/2014 1500   MCV 96.9 03/02/2012 1615    Lipid Panel     Component Value Date/Time   CHOL 241* 06/16/2012 1625   TRIG 176* 06/16/2012 1625   HDL 57 06/16/2012 1625   CHOLHDL 4.2 06/16/2012 1625   VLDL 35 06/16/2012 1625   LDLCALC 149* 06/16/2012 1625    ABG    Component Value Date/Time   PHART 7.389 10/29/2012 0915   PCO2ART 37.7 10/29/2012 0915   PO2ART 53.0* 10/29/2012 0915   HCO3 25.8* 10/29/2012 0925   TCO2 27 10/29/2012 0925   ACIDBASEDEF 4.0* 10/29/2012 0919   O2SAT 50.0 10/29/2012 0925     Lab Results  Component Value Date   TSH 1.496 03/02/2012   BNP (last 3 results) No results for input(s): BNP in the last 8760 hours.  ProBNP (last 3 results)  Recent Labs  03/06/15 1607  PROBNP 28.0    Cardiac Panel (last 3 results) No results for input(s): CKTOTAL, CKMB, TROPONINI, RELINDX in the last 72 hours.  Iron/TIBC/Ferritin/ %Sat No results found for: IRON, TIBC, FERRITIN, IRONPCTSAT   EKG Orders placed or performed during the hospital encounter of 10/27/14  . EKG 12-Lead  . EKG 12-Lead     Prior Assessment and Plan Problem List as of 06/26/2015      Cardiovascular and  Mediastinum   Hypertension   Last Assessment & Plan 10/10/2013 Office Visit Edited 10/10/2013  1:48 PM by Lendon Colonel, NP    Well controlled currently after taking herself off of the metoprolol.She is to continue  amlodipine as directed. Heart rate is still elevated when she stopped the metoprolol, but better on it. She states that she feels too sluggish and light headed when taking it. I believe that some of this is the nicotine use contributing to HR along with her COPD. Will see her again in 6 months.      Pulmonary hypertension [facility])   Last Assessment & Plan 05/10/2015 Office Visit Written 05/10/2015  4:03 PM by Juanito Doom, MD    For now we need to continue focusing treatment on her COPD and the getting her to quit smoking. However, if she has had no evidence of ischemic disease after her cardiology evaluations and we may need to consider pulmonary vasodilator treatment because of her ongoing symptoms which seem to be progressive with only a moderate decline in her lung function testing.      Chronic diastolic heart failure [facility])   Last Assessment & Plan 10/10/2013 Office Visit Written 10/10/2013  1:44 PM by Lendon Colonel, NP    Does not appear to be fluid overloaded at present. She is unable to tolerate the metoprolol due to hypotension and fatigue.        Respiratory   COPD (chronic obstructive pulmonary disease) with emphysema [facility])   Last Assessment & Plan 05/10/2015 Office Visit Edited 05/10/2015  4:01 PM by Juanito Doom, MD    She continues to struggle with chest tightness and shortness of breath. This is due to her COPD which is worse compared to 2014. Her ongoing tobacco use continues to cause her lungs to deteriorate. However, it should be noted that her airflow obstruction is only moderate in severity. She does have significant air trapping which is consistent with her severe COPD.  Plan: Continue current inhaled therapy (Breo and Spiriva) Counseled today at  length to quit smoking. Flu shot today      COPD exacerbation [facility])   Last Assessment & Plan 08/15/2014 Office Visit Written 08/15/2014  2:11 PM by Kathee Delton, MD    The patient has increasing wheezing, congestion, and increased shortness of breath that is all consistent with an acute COPD exacerbation. She does not think that she has a chest cold, and is not bringing up purulent mucus. Will treat her with a course of steroids, but I stressed to her the importance of total smoking cessation.        Genitourinary   Chronic kidney disease   Last Assessment & Plan 03/05/2012 Office Visit Written 03/05/2012  3:44 PM by Yehuda Savannah, MD    Renal function has normalized with adjustment of antihypertensive medications.  Asymptomatic urinary tract infection will be treated with a 3 day course of antibiotics.  Organism was Escherichia coli and was pansensitive.        Other   Chest pain   Last Assessment & Plan 07/16/2012 Office Visit Written 07/16/2012  5:17 PM by Lendon Colonel, NP    Doubt cardiac etiology of chest discomfort. May be broncho spams, or esophageal spams, which she has experienced in the past. She states that she was told she had coronary spams and had been placed on nitrates but stopped taking them. I have asked her to quit smoking and drinking so much caffeine to help with over stimulation which could possibly cause her symptoms as well. I have provided her a Rx for NTG 0.4 mg to use prn. Last cath 5 years ago is reassuring and will not pursue more testing at this time  until pulmonary evaluation is complete. Anxiety can also be playing a role in her chest pain as it is relieved with use of ativan.      Meige disease (lymphedema praecox)   Breast cancer [facility])   Hyperlipidemia   Last Assessment & Plan 06/17/2012 Office Visit Written 06/17/2012  4:50 PM by Lendon Colonel, NP    Total cholesterol was 241, HDL 57, triglycerides 176, LDL 149. Will place her on Lipitor 40 mg 1  by mouth at at bedtime. Left followup lipids and LFTs in 6 weeks. She has been advised a low-cholesterol diet. With comparison to prior cholesterol studies completed in October 2013. She has had a worsening of her total cholesterol and LDL      Anxiety and depression   Last Assessment & Plan 10/10/2013 Office Visit Written 10/10/2013  1:46 PM by Lendon Colonel, NP    She is to follow with PCP for this.       Tobacco abuse   Last Assessment & Plan 10/10/2013 Office Visit Written 10/10/2013  1:43 PM by Lendon Colonel, NP    Despite lengthy and numerous conversations on cessation, she will not quit smoking. She is aware of the risks and it upsets her, but she still continues to smoke.  I have wished her well with this, but continue my recommendations.      Obesity   Hyperkalemia   Last Assessment & Plan 03/05/2012 Office Visit Written 03/12/2012  8:25 AM by Yehuda Savannah, MD    Potassium restriction will be added to her diet and chemistry profile reassessed in one month.      DOE (dyspnea on exertion)   Last Assessment & Plan 05/10/2015 Office Visit Written 05/10/2015  4:02 PM by Juanito Doom, MD    She continues to complain of chest pain as well as shortness of breath. She did have a small area of indeterminate significance on her nuclear stress test which was felt to be related to her large hiatal hernia. However, she did describe to me the sensation of syncope during the test, though I cannot see documentation of this. She says that she felt like she blacked out during the stress portion of the test.  She continues to complain of shortness of breath. While I think that the COPD as the primary cause of her dyspnea I am concerned about the chest pain.  Plan: Refer to cardiology for further evaluation of the chest pain. Continue treatment for her COPD Counsel to quit smoking If no evidence of ischemic cardiac disease may need to consider pulmonary vasodilator therapy       Tobacco use disorder   Last Assessment & Plan 05/10/2015 Office Visit Written 05/10/2015  4:03 PM by Juanito Doom, MD    Counseled at length to quit smoking today.          Imaging: No results found.

## 2015-07-13 ENCOUNTER — Telehealth: Payer: Self-pay | Admitting: Acute Care

## 2015-07-13 NOTE — Telephone Encounter (Signed)
Left Message to make Appointment.  x3 Sent letter  Dear Mrs. Kathleen Burns  We have attempted to call you several times to schedule the lung screening Dr. Alford Highland and Dr. Lake Bells requested you have performed. We have been unable to contact you by phone. Please call the number below at your earliest convenience so that we can get you scheduled for your screening. We look forward to participating in your care.  Thank you,  [facility] [phone]

## 2015-08-13 ENCOUNTER — Encounter: Payer: Self-pay | Admitting: Pulmonary Disease

## 2015-08-13 ENCOUNTER — Ambulatory Visit (INDEPENDENT_AMBULATORY_CARE_PROVIDER_SITE_OTHER): Payer: Medicare HMO | Admitting: Pulmonary Disease

## 2015-08-13 ENCOUNTER — Other Ambulatory Visit (INDEPENDENT_AMBULATORY_CARE_PROVIDER_SITE_OTHER): Payer: Medicare HMO

## 2015-08-13 VITALS — BP 136/82 | HR 94 | Ht 66.0 in | Wt 192.0 lb

## 2015-08-13 DIAGNOSIS — Z5181 Encounter for therapeutic drug level monitoring: Secondary | ICD-10-CM

## 2015-08-13 DIAGNOSIS — Z720 Tobacco use: Secondary | ICD-10-CM

## 2015-08-13 DIAGNOSIS — J438 Other emphysema: Secondary | ICD-10-CM

## 2015-08-13 DIAGNOSIS — F17200 Nicotine dependence, unspecified, uncomplicated: Secondary | ICD-10-CM

## 2015-08-13 DIAGNOSIS — F17210 Nicotine dependence, cigarettes, uncomplicated: Secondary | ICD-10-CM

## 2015-08-13 LAB — HEPATIC FUNCTION PANEL
ALBUMIN: 4.4 g/dL (ref 3.5–5.2)
ALT: 7 U/L (ref 0–35)
AST: 12 U/L (ref 0–37)
Alkaline Phosphatase: 99 U/L (ref 39–117)
Bilirubin, Direct: 0.1 mg/dL (ref 0.0–0.3)
TOTAL PROTEIN: 8 g/dL (ref 6.0–8.3)
Total Bilirubin: 0.4 mg/dL (ref 0.2–1.2)

## 2015-08-13 NOTE — Assessment & Plan Note (Signed)
She has significant pulmonary hypertension is seen on a right heart catheterization in 2014. I believe that this is disproportional to her moderate airflow obstruction. Surprisingly, she did not have evidence of diastolic heart failure on that heart catheterization. Because cardiology does not have a further workup planned for her cardiac disease and she continues to have severe dyspnea despite only having moderate airflow obstruction him going to try a trial of pulmonary vasodilator therapy. Because of her known coronary disease I'm not going to prescribe sildenafil or other agents in that class because she may receive nitrates at some point. Therefore, I will prescribe Letairis.  Plan: Start Letairis Quarterly 6 minute testing Quarterly LFTs starting today

## 2015-08-13 NOTE — Assessment & Plan Note (Signed)
Counseled at length today to quit smoking. She is trying hypnosis.

## 2015-08-13 NOTE — Assessment & Plan Note (Signed)
She has moderate airflow obstruction which contributes in part to her COPD. She continues to smoke cigarettes which she knows causes further deterioration in her symptoms and lung disease. She was educated again today at length to stop smoking. However, she does note that when she tries taking Spiriva she ends up coughing it out. She is tolerant of polio.  Plan: Continue Brio Trial of Incruise, call me for a prescription if this works well

## 2015-08-13 NOTE — Patient Instructions (Signed)
We are going to start you on a medication called Letairis (Ambrisentan) or your pulmonary hypertension.  We will start the medication at 5 mg daily and if you are doing well we will increase it to 10 mg daily.  We will not ensure your liver function tests every 2 weeks for the first month followed by monthly testing for the first 3 months. If those tests are okay check your liver function tests every 3 months as long as you take the medication.  You should not take this medication if you are pregnant.  Stopped taking Spiriva Take Incruise one puff daily  We will see you back in 2 months or sooner if needed

## 2015-08-13 NOTE — Progress Notes (Signed)
Subjective:    Patient ID: Kathleen Burns, female    DOB: Sep 01, 1955, 60 y.o.   MRN: [id number]  Synopsis: Former patient of Dr. Gwenette Greet with COPD and pulmonary hypertension Echo 04/2012:  Normal EF, LVH with diastolic dysfunction and elevated LV end-diastolic pressure, mildly dilated LA, mild decrease in RV function, PA peak pressure 30m. [place]:  Mean PA 35, PCWP 15, Fick CO 4.2, PVR 4-5 Felt to be due to her copd, as well as diastolic dysfxn. ONO RA 2014: desat to 80% >> started on nocturnal oxygen.  PFT's 08/2012:  FEV1 2.11 (75%), ratio 66, +airtrapping, no restriction, DLCO 38%. ONO 2014:  desat to 80% >> started on nocturnal oxygen >> turned back in.  Did not want. September 2016 pulmonary function testing ratio 67%, FEV1 1.86 L (67% predicted), total lung capacity 6.03 L (112% predicted), DLCO 7.97 (29% predicted), residual volume 3.34 L (159% predicted).  HPI Chief Complaint  Patient presents with  . Follow-up    pt c/o persistent nonprod cough Xapprox 2 mos- unsure if cough is d/t new meds given by cardiologist.    DOsvaldo Angstsays that she has been coughing more recently since going to to the cardiology visit.   She has been swelling in her ankles more. She is not coughing up mucus, it is just dry. She is more dyspneic in the evenings, often can't walk too far due to her dyspnea.  It is definitely much worse in the evenings.   Spiriva is making her cough more.  She doesn't like the mist form and feels like she can't time her breath.  [facility].   She is still smoking 2-2.5 packs per day.  They are working on hypnosis, but can't afford it right now.   Past Medical History  Diagnosis Date  . COPD (chronic obstructive pulmonary disease) ([place]   . Chest pain     Cardiac catheterization in 1999, 2003, 2008-normal EF, normal coronaries  . GERD (gastroesophageal reflux disease)   . Hypertension   . Meige disease (lymphedema praecox)     blepharospasm;  [facility]  . Osteoarthritis   . Breast cancer ([place]     Left mastectomy in 1990s + chemotherapy  . Hyperlipidemia   . Anxiety and depression     H/o suicide attempt  . Skull [facility] ([facility] 1969    surgical repair in 1969  . Hearing impairment   . Tobacco abuse     45 pack years  . Obesity   . Pulmonary hypertension ([place]       Review of Systems  Constitutional: Positive for fatigue. Negative for fever and chills.  HENT: Negative for postnasal drip, rhinorrhea and sinus pressure.   Respiratory: Positive for cough and shortness of breath. Negative for wheezing.   Cardiovascular: Positive for chest pain. Negative for palpitations and leg swelling.       Objective:   Physical Exam Filed Vitals:   08/13/15 1328  BP: 136/82  Pulse: 94  Height: '5\' 6"'$  (1.676 m)  Weight: 192 lb (87.091 kg)  SpO2: 94%   RA  Gen: chronically ill appearing HENT: OP clear, TM's clear, neck supple PULM: Crackles bases, normal percussion CV: RRR, gallop noted, mild JVD, notable pretibial edema GI: BS+, soft, nontender Derm: no cyanosis or rash Psyche: normal mood and affect  Cardiology records reviewed, no ischemia work up planned CXR again reviewed: Large hiatal hernia, no emphysema  Assessment & Plan:  COPD (chronic obstructive pulmonary disease) with emphysema She has moderate airflow obstruction which contributes in part to her COPD. She continues to smoke cigarettes which she knows causes further deterioration in her symptoms and lung disease. She was educated again today at length to stop smoking. However, she does note that when she tries taking Spiriva she ends up coughing it out. She is tolerant of polio.  Plan: Continue Brio Trial of [place], call me for a prescription if this works well  Tobacco use disorder Counseled at length today to quit smoking. She is trying hypnosis.  Pulmonary hypertension ([place]) She has  significant pulmonary hypertension is seen on a right heart catheterization in 2014. I believe that this is disproportional to her moderate airflow obstruction. Surprisingly, she did not have evidence of diastolic heart failure on that heart catheterization. Because cardiology does not have a further workup planned for her cardiac disease and she continues to have severe dyspnea despite only having moderate airflow obstruction him going to try a trial of pulmonary vasodilator therapy. Because of her known coronary disease I'm not going to prescribe sildenafil or other agents in that class because she may receive nitrates at some point. Therefore, I will prescribe Letairis.  Plan: Start Letairis Quarterly 6 minute testing Quarterly LFTs starting today     Current outpatient prescriptions:  .  albuterol (PROAIR HFA) 108 (90 BASE) MCG/ACT inhaler, Inhale 2 puffs into the lungs 4 (four) times daily as needed. For shortness of breath , Disp: , Rfl:  .  amLODipine (NORVASC) 5 MG tablet, Take 1 tablet (5 mg total) by mouth daily., Disp: 30 tablet, Rfl: 11 .  cetirizine (ZYRTEC) 10 MG tablet, Take 10 mg by mouth as needed for allergies. , Disp: , Rfl:  .  Fluticasone Furoate-Vilanterol (BREO ELLIPTA) 100-25 MCG/INH AEPB, Inhale 1 puff into the lungs daily., Disp: 90 each, Rfl: 3 .  HYDROcodone-acetaminophen (NORCO) 10-325 MG per tablet, Take 1 tablet by mouth 2 (two) times daily. For pain, Disp: , Rfl:  .  LORazepam (ATIVAN) 1 MG tablet, Take 1 mg by mouth every 8 (eight) hours.  , Disp: , Rfl:  .  metoprolol tartrate (LOPRESSOR) 25 MG tablet, Take 0.5 tablets (12.5 mg total) by mouth 2 (two) times daily., Disp: 30 tablet, Rfl: 11 .  montelukast (SINGULAIR) 10 MG tablet, Take 10 mg by mouth at bedtime., Disp: , Rfl:  .  Morphine-Naltrexone (EMBEDA) 60-2.4 MG CPCR, Take 1 capsule by mouth 2 (two) times daily., Disp: , Rfl:  .  nitroGLYCERIN (NITROSTAT) 0.4 MG SL tablet, Place 1 tablet (0.4 mg total) under  the tongue every 5 (five) minutes as needed for chest pain., Disp: 25 tablet, Rfl: 3 .  omeprazole (PRILOSEC) 20 MG capsule, Take 20 mg by mouth 2 (two) times daily.  , Disp: , Rfl:  .  ranolazine (RANEXA) 500 MG 12 hr tablet, Take 1 tablet (500 mg total) by mouth 2 (two) times daily., Disp: 60 tablet, Rfl: 11 .  Tiotropium Bromide Monohydrate (SPIRIVA RESPIMAT) 2.5 MCG/ACT AERS, Inhale 2 puffs into the lungs every morning., Disp: 3 Inhaler, Rfl: 2 .  zolpidem (AMBIEN CR) 12.5 MG CR tablet, Take 12.5 mg by mouth at bedtime.  , Disp: , Rfl:

## 2015-08-15 ENCOUNTER — Ambulatory Visit (INDEPENDENT_AMBULATORY_CARE_PROVIDER_SITE_OTHER): Payer: Medicare HMO | Admitting: Pulmonary Disease

## 2015-08-15 DIAGNOSIS — R0609 Other forms of dyspnea: Secondary | ICD-10-CM | POA: Diagnosis not present

## 2015-08-16 ENCOUNTER — Encounter: Payer: Self-pay | Admitting: Pulmonary Disease

## 2015-08-16 ENCOUNTER — Encounter: Payer: Self-pay | Admitting: Adult Health

## 2015-08-16 MED ORDER — FLUTICASONE FUROATE-VILANTEROL 100-25 MCG/INH IN AEPB: 1.0000 | INHALATION_SPRAY | Freq: Every day | RESPIRATORY_TRACT | Status: DC

## 2015-08-20 ENCOUNTER — Telehealth: Payer: Self-pay | Admitting: Pulmonary Disease

## 2015-08-20 NOTE — Telephone Encounter (Signed)
Spoke with Kennyth Lose at [employer], states that [employer] is requiring a PA for pt's Letairis.   Verbal PA # for [phone] Member ID# [place] above number and spoke with Webb Silversmith to do verbal PA for this medication- PA approved through 06/29/2016.    Called [employer] and spoke with Thomasena Edis to make aware of approved PA.  Nothing further needed.

## 2015-08-21 ENCOUNTER — Encounter: Payer: Self-pay | Admitting: Pulmonary Disease

## 2015-08-21 ENCOUNTER — Encounter: Payer: Self-pay | Admitting: Adult Health

## 2015-08-21 ENCOUNTER — Other Ambulatory Visit: Payer: Self-pay

## 2015-08-21 MED ORDER — RANOLAZINE ER 500 MG PO TB12: 500.0000 mg | ORAL_TABLET | Freq: Two times a day (BID) | ORAL | Status: DC

## 2015-08-21 MED ORDER — UMECLIDINIUM BROMIDE 62.5 MCG/INH IN AEPB: 1.0000 | INHALATION_SPRAY | Freq: Every day | RESPIRATORY_TRACT | Status: DC

## 2015-08-21 MED ORDER — AMLODIPINE BESYLATE 5 MG PO TABS: 5.0000 mg | ORAL_TABLET | Freq: Every day | ORAL | Status: DC

## 2015-08-21 MED ORDER — METOPROLOL TARTRATE 25 MG PO TABS: 12.5000 mg | ORAL_TABLET | Freq: Two times a day (BID) | ORAL | Status: DC

## 2015-08-21 NOTE — Telephone Encounter (Signed)
Refills complet

## 2015-08-22 NOTE — Addendum Note (Signed)
Addended by: Len Blalock on: 08/22/2015 04:52 PM   Modules accepted: Orders

## 2015-08-27 ENCOUNTER — Encounter: Payer: Self-pay | Admitting: Pulmonary Disease

## 2015-08-27 ENCOUNTER — Telehealth: Payer: Self-pay | Admitting: Pulmonary Disease

## 2015-08-27 NOTE — Telephone Encounter (Signed)
Spoke with Helene Kelp. She is needing a lab order form filled out for this pt. We do not have this form in BQ's look at. Helene Kelp is going to re-fax this form to Ashley's attention. Will await form.

## 2015-08-28 ENCOUNTER — Encounter: Payer: Self-pay | Admitting: Pulmonary Disease

## 2015-08-28 ENCOUNTER — Encounter: Payer: Self-pay | Admitting: Adult Health

## 2015-08-28 NOTE — Telephone Encounter (Signed)
Sharyn Lull501 [phone] Needs to verify rx, claify rx.  [facility]ance #[id number]

## 2015-08-28 NOTE — Telephone Encounter (Signed)
Spoke with pharmacist at [facility] and verified dosage of Letaris to be '5mg'$  once daily and if tolerated increase to '10mg'$  daily. Pharmacy to dispense '5mg'$  tabs for now and if pt tolerates we can send new order for '10mg'$  tabs..  Will forward message back to [place] to f/u on lab order form once signed by BQ,

## 2015-08-28 NOTE — Telephone Encounter (Signed)
Form received and is in BQ cubby.  Will hold until BQ can sign form.

## 2015-08-31 NOTE — Telephone Encounter (Signed)
Caryl Pina - has this been signed?  Please advise.

## 2015-09-03 NOTE — Telephone Encounter (Signed)
Form was given to BQ for signature, have not yet received form back from BQ.

## 2015-09-07 ENCOUNTER — Other Ambulatory Visit: Payer: Self-pay | Admitting: Acute Care

## 2015-09-07 DIAGNOSIS — F17210 Nicotine dependence, cigarettes, uncomplicated: Secondary | ICD-10-CM

## 2015-09-10 NOTE — Telephone Encounter (Signed)
Kathleen Burns, has form been signed? thanks

## 2015-09-10 NOTE — Telephone Encounter (Signed)
Clarene Critchley from [facility] [place], [phone] ext [phone] called re lab order form for Letaris following up to see if this has been sent in.  If not recd 03/20 the patient will not be enrolled in the program.

## 2015-09-10 NOTE — Telephone Encounter (Signed)
Kathleen Burns states that the [employer] program is a program where they monitor the patients lab visits for [employer].  Forms need to be signed and faxed back to the program before 3/20 or patient will not be enrolled in this program.

## 2015-09-11 NOTE — Telephone Encounter (Signed)
No, they always want to do this. I want her labs drawn at [facility] offic or else I never see the results.  Please ensure she has LFT's ordered monthly for therapeutic drug monitoring for the next 3 moths.

## 2015-09-11 NOTE — Telephone Encounter (Signed)
lmtcb x1 for [place].

## 2015-09-12 NOTE — Telephone Encounter (Signed)
Spoke with Clarene Critchley and notified her of recs per BQ  She verbalized understanding  LMTCB for the pt

## 2015-09-13 NOTE — Telephone Encounter (Signed)
LM x 2 for pt 

## 2015-09-13 NOTE — Telephone Encounter (Signed)
[phone] pt calling back

## 2015-09-13 NOTE — Telephone Encounter (Signed)
Patient notified of Dr. Anastasia Pall recommendations Patient states that she has another blood draw tomorrow, this will be the 2nd one, then she can go to monthly Patient states that she gets it drawn in [employer]. Nothing further needed.

## 2015-09-14 ENCOUNTER — Other Ambulatory Visit: Payer: Self-pay | Admitting: Pulmonary Disease

## 2015-09-14 DIAGNOSIS — Z5181 Encounter for therapeutic drug level monitoring: Secondary | ICD-10-CM

## 2015-09-15 LAB — HEPATIC FUNCTION PANEL
ALBUMIN: 3.7 g/dL (ref 3.6–5.1)
ALT: 7 U/L (ref 6–29)
AST: 10 U/L (ref 10–35)
Alkaline Phosphatase: 87 U/L (ref 33–130)
BILIRUBIN TOTAL: 0.4 mg/dL (ref 0.2–1.2)
Bilirubin, Direct: 0.1 mg/dL (ref <=0.2)
Indirect Bilirubin: 0.3 mg/dL (ref 0.2–1.2)
Total Protein: 6.6 g/dL (ref 6.1–8.1)

## 2015-09-24 ENCOUNTER — Ambulatory Visit (INDEPENDENT_AMBULATORY_CARE_PROVIDER_SITE_OTHER): Payer: Medicare HMO | Admitting: Acute Care

## 2015-09-24 ENCOUNTER — Ambulatory Visit (INDEPENDENT_AMBULATORY_CARE_PROVIDER_SITE_OTHER)
Admission: RE | Admit: 2015-09-24 | Discharge: 2015-09-24 | Disposition: A | Discharge status: Home / Self Care | Payer: Medicare HMO | Source: Ambulatory Visit | Attending: Acute Care | Admitting: Acute Care

## 2015-09-24 ENCOUNTER — Encounter: Payer: Self-pay | Admitting: Acute Care

## 2015-09-24 DIAGNOSIS — F17210 Nicotine dependence, cigarettes, uncomplicated: Secondary | ICD-10-CM

## 2015-09-24 NOTE — Progress Notes (Signed)
Shared Decision Making Visit Lung Cancer Screening Program [phone])   Eligibility:  Age 60 y.o.  Pack Years Smoking History Calculation 60 pack years (# packs/per year x # years smoked)  Recent History of coughing up blood  no  Unexplained weight loss? no ( >Than 15 pounds within the last 6 months )  Prior History Lung / other cancer No ( Breast Cancer in 1990/ no surveillance scans (Diagnosis within the last 5 years already requiring surveillance chest CT Scans).  Smoking Status Current Smoker  Former Smokers: Years since quit: NA  Quit Date: NA  Visit Components:  Discussion included one or more decision making aids. yes  Discussion included risk/benefits of screening. yes  Discussion included potential follow up diagnostic testing for abnormal scans. yes  Discussion included meaning and risk of over diagnosis. yes  Discussion included meaning and risk of False Positives. yes  Discussion included meaning of total radiation exposure. yes  Counseling Included:  Importance of adherence to annual lung cancer LDCT screening. yes  Impact of comorbidities on ability to participate in the program. yes  Ability and willingness to under diagnostic treatment. yes  Smoking Cessation Counseling:  Current Smokers:   Discussed importance of smoking cessation. yes  Information about tobacco cessation classes and interventions provided to patient. yes  Patient provided with "ticket" for LDCT Scan. yes  Symptomatic Patient. no  Counseling  Diagnosis Code: Tobacco Use Z72.0  Asymptomatic Patient yes  Counseling (Intermediate counseling: > three minutes counseling) E3154  Former Smokers:   Discussed the importance of maintaining cigarette abstinence. {NA, current smoker  Diagnosis Code: Personal History of Nicotine Dependence. M08.676  Information about tobacco cessation classes and interventions provided to patient. Yes  Patient provided with "ticket" for LDCT  Scan. yes  Written Order for Lung Cancer Screening with LDCT placed in Epic. Yes (CT Chest Lung Cancer Screening Low Dose W/O CM) [id number] Z12.2-Screening of respiratory organs Z87.891-Personal history of nicotine dependence  I have spent 15 minutes of face to face time with Kathleen Burns discussing the risks and benefits of lung cancer screening. We viewed a power point together that explained in detail the above noted topics. We paused at intervals to allow for questions to be asked and answered to ensure understanding.We discussed that the single most powerful action that she can take to decrease her risk of developing lung cancer is to quit smoking. We discussed whether or not she is ready to commit to setting a quit date. She is not ready to commit to a quit date at this time. We discussed options for tools to aid in quitting smoking including nicotine replacement therapy, non-nicotine medications, support groups, Quit Smart classes, and behavior modification. We discussed that often times setting smaller, more achievable goals, such as eliminating 1 cigarette a day for a week and then 2 cigarettes a day for a week can be helpful in slowly decreasing the number of cigarettes smoked. This allows for a sense of accomplishment as well as providing a clinical benefit. I gave Kathleen Burns the " Be Stronger Than Your Excuses" card with contact information for community resources, classes, free nicotine replacement therapy, and access to mobile apps, text messaging, and on-line smoking cessation help. I have also given her my card and contact information in the event she needs to contact me. We discussed the time and location of the scan, and that either Kathleen Burns, CMA, or I will call with the results within 24-48 hours of receiving them. I have  provided her with a copy of the power point we viewed  as a resource in the event they need reinforcement of the concepts we discussed today in the office. The patient  verbalized understanding of all of  the above and had no further questions upon leaving the office. They have my contact information in the event they have any further questions.  Kathleen Spatz, NP 09/24/2015

## 2015-09-27 ENCOUNTER — Telehealth: Payer: Self-pay | Admitting: Acute Care

## 2015-09-27 NOTE — Telephone Encounter (Signed)
I have called Kathleen Burns with the results of her low-dose CT. I explained to her that her scan was read as a lung RADS 2, lung nodules that are benign in appearance and behavior. I explained that we will call her for repeat scan in March 2018. I did mention that there was an additional finding on her scan of lymphadenopathy measuring up to 1.6 cm on the short axis. I will message Dr. Lake Bells, and asked him if he would like me to proceed with ordering a PET scan as was recommended by radiology. This patient is a breast cancer survivor.

## 2015-09-28 ENCOUNTER — Encounter: Payer: Self-pay | Admitting: Acute Care

## 2015-10-01 ENCOUNTER — Other Ambulatory Visit (HOSPITAL_COMMUNITY)
Admission: RE | Admit: 2015-10-01 | Discharge: 2015-10-01 | Disposition: A | Discharge status: Home / Self Care | Payer: Medicare HMO | Source: Ambulatory Visit | Attending: Pulmonary Disease | Admitting: Pulmonary Disease

## 2015-10-01 DIAGNOSIS — Z5181 Encounter for therapeutic drug level monitoring: Secondary | ICD-10-CM | POA: Diagnosis present

## 2015-10-01 LAB — HEPATIC FUNCTION PANEL
ALBUMIN: 3.8 g/dL (ref 3.5–5.0)
ALT: 10 U/L — ABNORMAL LOW (ref 14–54)
AST: 14 U/L — AB (ref 15–41)
Alkaline Phosphatase: 76 U/L (ref 38–126)
Bilirubin, Direct: 0.1 mg/dL (ref 0.1–0.5)
Indirect Bilirubin: 0.4 mg/dL (ref 0.3–0.9)
TOTAL PROTEIN: 7.3 g/dL (ref 6.5–8.1)
Total Bilirubin: 0.5 mg/dL (ref 0.3–1.2)

## 2015-10-02 ENCOUNTER — Encounter: Payer: Self-pay | Admitting: Acute Care

## 2015-10-02 ENCOUNTER — Other Ambulatory Visit: Payer: Self-pay | Admitting: Acute Care

## 2015-10-02 DIAGNOSIS — F17210 Nicotine dependence, cigarettes, uncomplicated: Principal | ICD-10-CM

## 2015-10-02 NOTE — Telephone Encounter (Signed)
Pamala Hurry barlow cb to speak to Judson Roch [phone]

## 2015-10-02 NOTE — Telephone Encounter (Signed)
Sarah please see attached e-mail from patient. Thanks.

## 2015-10-02 NOTE — Telephone Encounter (Signed)
I returned the call to the patient. I explained that Dr. Lake Bells wants her to have a repeat CT in 3 months and that he will see her after that to discuss it with her. She verbalized understanding. I have ordered the follow up CT. The patient knows it is scheduled.

## 2015-10-04 NOTE — Telephone Encounter (Signed)
SG please advise if you've spoken to [place].  Thanks.

## 2015-10-04 NOTE — Telephone Encounter (Signed)
I called and spoke with Kathleen Burns. Kathleen Burns was not there. I reminded Kathleen Burns that she is scheduled to see Dr. Lake Bells this month, and they should come to that appointment with any questions they may have regarding the scan. I reminded her that her repeat scan was scheduled for June 28th, and that someone should call with a time for the scan closer to that date.She verbalized understanding and had no further questions at the time the call was completed.

## 2015-10-05 ENCOUNTER — Encounter: Payer: Self-pay | Admitting: *Deleted

## 2015-10-18 ENCOUNTER — Telehealth: Payer: Self-pay

## 2015-10-18 NOTE — Telephone Encounter (Signed)
lmtcb X1 to make pt aware.

## 2015-10-18 NOTE — Telephone Encounter (Signed)
-----   Message from Juanito Doom, MD sent at 10/17/2015  4:26 PM EDT ----- A, Please let the patient know her blood work was [employer], B

## 2015-10-19 ENCOUNTER — Ambulatory Visit: Payer: Medicare HMO | Admitting: Pulmonary Disease

## 2015-10-19 NOTE — Telephone Encounter (Signed)
[phone] calling back

## 2015-10-19 NOTE — Telephone Encounter (Signed)
Spoke with pt, aware of results/recs.  Nothing further needed.  

## 2015-10-26 ENCOUNTER — Other Ambulatory Visit (HOSPITAL_COMMUNITY)
Admission: RE | Admit: 2015-10-26 | Discharge: 2015-10-26 | Disposition: A | Discharge status: Home / Self Care | Payer: Medicare HMO | Source: Ambulatory Visit | Attending: Pulmonary Disease | Admitting: Pulmonary Disease

## 2015-10-26 ENCOUNTER — Telehealth: Payer: Self-pay | Admitting: Pulmonary Disease

## 2015-10-26 DIAGNOSIS — Z5181 Encounter for therapeutic drug level monitoring: Secondary | ICD-10-CM | POA: Diagnosis present

## 2015-10-26 LAB — HEPATIC FUNCTION PANEL
ALT: 12 U/L — ABNORMAL LOW (ref 14–54)
AST: 14 U/L — AB (ref 15–41)
Albumin: 3.8 g/dL (ref 3.5–5.0)
Alkaline Phosphatase: 77 U/L (ref 38–126)
BILIRUBIN DIRECT: 0.1 mg/dL (ref 0.1–0.5)
BILIRUBIN INDIRECT: 0.3 mg/dL (ref 0.3–0.9)
BILIRUBIN TOTAL: 0.4 mg/dL (ref 0.3–1.2)
TOTAL PROTEIN: 7.5 g/dL (ref 6.5–8.1)

## 2015-10-26 NOTE — Telephone Encounter (Signed)
Printed order and faxed to number given by patient to have blood drawn. Patient aware that order has been faxed. Nothing further needed.

## 2015-10-31 ENCOUNTER — Encounter: Payer: Self-pay | Admitting: Pulmonary Disease

## 2015-10-31 DIAGNOSIS — I272 Pulmonary hypertension, unspecified: Secondary | ICD-10-CM

## 2015-10-31 DIAGNOSIS — Z5181 Encounter for therapeutic drug level monitoring: Secondary | ICD-10-CM

## 2015-11-05 ENCOUNTER — Encounter: Payer: Self-pay | Admitting: Pulmonary Disease

## 2015-11-05 NOTE — Telephone Encounter (Signed)
  Non-Urgent Medical Question     From   [name]    To   Juanito Doom, MD    Sent   11/05/2015 11:36 AM       Dr Lake Bells I seem to be having a problem and I don't know if it has to do with the Letairis, especially in the late afternoon. I start coughing it's like a moist cough and continuous my oxygen levels during that time are under 80 and as low as 73. I cannot even walk the length of the trailer without my O2 levels dropping. I take the Letairis around 7pm. I started out taking it in the morning and it did the same thing. Is this a side effect of the Letairis. Thank you Kathleen Burns       Spoke with patient - C/o increased SOB, congestion, wet/moist cough with clear mucus x 2-3days ago. Pt taking Letaris 1 daily around 7pm. Pt feels may be related to allergies - been having some increased allergies. Pt notes low O2 sats x 1-2 weeks -- worsening that last 3-4 days. Pt states that O2 drops below 80% and sometimes as low as 73%. Pt states that as long as she sits still she is okay and O2 is above 88% but at times will read around mid 80's. Pt states that with exertion -walking from one end of house to the other her O2 drops very low, or even to the bathroom.  Pt notes some nasal congestion as well.  Pt wants to know if the Letaris could be making her breathing worse. Pt reports that when she took it in the morning she had the same issues. Pt aware that we are sending this to Dr Lake Bells to advise.

## 2015-11-05 NOTE — Telephone Encounter (Signed)
Per BQ: Dianne, that would be a rare side effect. Given your prior heavy tobacco use I would be more concerned about bronchitis. I think you should be seen here in the next 2-3 days to help sort this out. ---- Called and spoke with patient, rec's given and appt scheduled for 11/06/15 at 4pm with BQ Nothing further needed.

## 2015-11-06 ENCOUNTER — Ambulatory Visit (INDEPENDENT_AMBULATORY_CARE_PROVIDER_SITE_OTHER)
Admission: RE | Admit: 2015-11-06 | Discharge: 2015-11-06 | Disposition: A | Discharge status: Home / Self Care | Payer: Medicare HMO | Source: Ambulatory Visit | Attending: Pulmonary Disease | Admitting: Pulmonary Disease

## 2015-11-06 ENCOUNTER — Other Ambulatory Visit (INDEPENDENT_AMBULATORY_CARE_PROVIDER_SITE_OTHER): Payer: Medicare HMO

## 2015-11-06 ENCOUNTER — Encounter: Payer: Self-pay | Admitting: Pulmonary Disease

## 2015-11-06 ENCOUNTER — Ambulatory Visit (INDEPENDENT_AMBULATORY_CARE_PROVIDER_SITE_OTHER): Payer: Medicare HMO | Admitting: Pulmonary Disease

## 2015-11-06 VITALS — BP 132/74 | HR 104 | Temp 97.5°F | Ht 66.0 in | Wt 192.8 lb

## 2015-11-06 DIAGNOSIS — R0600 Dyspnea, unspecified: Secondary | ICD-10-CM

## 2015-11-06 DIAGNOSIS — J441 Chronic obstructive pulmonary disease with (acute) exacerbation: Secondary | ICD-10-CM | POA: Diagnosis not present

## 2015-11-06 LAB — CBC WITH DIFFERENTIAL/PLATELET
BASOS PCT: 0.3 % (ref 0.0–3.0)
Basophils Absolute: 0 10*3/uL (ref 0.0–0.1)
EOS ABS: 0 10*3/uL (ref 0.0–0.7)
EOS PCT: 0 % (ref 0.0–5.0)
HEMATOCRIT: 45.2 % (ref 36.0–46.0)
HEMOGLOBIN: 15.3 g/dL — AB (ref 12.0–15.0)
LYMPHS PCT: 22.4 % (ref 12.0–46.0)
Lymphs Abs: 2.4 10*3/uL (ref 0.7–4.0)
MCHC: 33.7 g/dL (ref 30.0–36.0)
MCV: 99 fl (ref 78.0–100.0)
Monocytes Absolute: 0.8 10*3/uL (ref 0.1–1.0)
Monocytes Relative: 7.8 % (ref 3.0–12.0)
NEUTROS ABS: 7.4 10*3/uL (ref 1.4–7.7)
Neutrophils Relative %: 69.5 % (ref 43.0–77.0)
PLATELETS: 317 10*3/uL (ref 150.0–400.0)
RBC: 4.57 Mil/uL (ref 3.87–5.11)
RDW: 13.1 % (ref 11.5–15.5)
WBC: 10.7 10*3/uL — AB (ref 4.0–10.5)

## 2015-11-06 MED ORDER — PREDNISONE 10 MG PO TABS: ORAL_TABLET | ORAL | Status: DC

## 2015-11-06 NOTE — Patient Instructions (Addendum)
It is good to see you today. I am sorry you are not feeling well. Please remember the single most powerful action that you can take to improve your lung function is to quit smoking. We will check a CXR today We will call you with the results We will collect labs ( CBC, Pro BNP) We will call with results. Prednisone taper; 10 mg tablets: 4 tabs x 2 days, 3 tabs x 2 days, 2 tabs x 2 days 1 tab x 2 days then stop. Add flonase nasal spray 2 puffs in each nostril daily We will add Mucinex twice daily with a full glass of water  For chest congestion. We will set you up for oxygen at home. Please wear it at 3 L with exertion We will check an overnight oximetry test to see if you are having drops in your oxygen at night Call back if not better in 2-3 days Follow up in 4 weeks with Dr. Lake Bells or Judson Roch. Please contact office for sooner follow up if symptoms do not improve or worsen or seek emergency care .

## 2015-11-06 NOTE — Progress Notes (Signed)
Subjective:    Patient ID: Kathleen Burns, female    DOB: 1956-04-25, 60 y.o.   MRN: [id number]  HPI  11/06/2015 Acute Visit: Pt presents today with 1-2 week history of worsening SOB that started after mowing the yard with a push mower.. She has recorded oxygen saturations in the 70's on room air with exertion.She has been on Lutera x 3 months for her pulmonary hypertension.She continues to smoke close to 2 packs of cigarettes daily.She is coughing up clear secretions, she denies fever, chest pain, hemoptysis, orthopnea.She denies leg or calf pain. No recent car trips , no recent airline trips.She is using her rescue inhaler once or twice daily. Does not like to use it due to the fact it makes her heart race, and her hands shake. She states that she has had an asbestos exposure in the past at work.   Current outpatient prescriptions:  .  albuterol (PROAIR HFA) 108 (90 BASE) MCG/ACT inhaler, Inhale 2 puffs into the lungs 4 (four) times daily as needed. For shortness of breath , Disp: , Rfl:  .  ambrisentan (LETAIRIS) 5 MG tablet, Take 5 mg by mouth daily., Disp: , Rfl:  .  amLODipine (NORVASC) 5 MG tablet, Take 1 tablet (5 mg total) by mouth daily., Disp: 90 tablet, Rfl: 3 .  cetirizine (ZYRTEC) 10 MG tablet, Take 10 mg by mouth as needed for allergies. , Disp: , Rfl:  .  fluticasone furoate-vilanterol (BREO ELLIPTA) 100-25 MCG/INH AEPB, Inhale 1 puff into the lungs daily., Disp: 90 each, Rfl: 3 .  HYDROcodone-acetaminophen (NORCO) 10-325 MG per tablet, Take 1 tablet by mouth 2 (two) times daily. For pain, Disp: , Rfl:  .  LORazepam (ATIVAN) 1 MG tablet, Take 1 mg by mouth every 8 (eight) hours.  , Disp: , Rfl:  .  metoprolol tartrate (LOPRESSOR) 25 MG tablet, Take 0.5 tablets (12.5 mg total) by mouth 2 (two) times daily., Disp: 90 tablet, Rfl: 3 .  montelukast (SINGULAIR) 10 MG tablet, Take 10 mg by mouth at bedtime., Disp: , Rfl:  .  Morphine-Naltrexone (EMBEDA) 60-2.4 MG CPCR, Take 1 capsule by  mouth 2 (two) times daily., Disp: , Rfl:  .  nitroGLYCERIN (NITROSTAT) 0.4 MG SL tablet, Place 1 tablet (0.4 mg total) under the tongue every 5 (five) minutes as needed for chest pain., Disp: 25 tablet, Rfl: 3 .  omeprazole (PRILOSEC) 20 MG capsule, Take 20 mg by mouth 2 (two) times daily.  , Disp: , Rfl:  .  ranolazine (RANEXA) 500 MG 12 hr tablet, Take 1 tablet (500 mg total) by mouth 2 (two) times daily., Disp: 180 tablet, Rfl: 3 .  Tiotropium Bromide Monohydrate (SPIRIVA RESPIMAT) 2.5 MCG/ACT AERS, Inhale 2 puffs into the lungs every morning., Disp: 3 Inhaler, Rfl: 2 .  umeclidinium bromide (INCRUSE ELLIPTA) 62.5 MCG/INH AEPB, Inhale 1 puff into the lungs daily., Disp: 90 each, Rfl: 1 .  zolpidem (AMBIEN CR) 12.5 MG CR tablet, Take 12.5 mg by mouth at bedtime.  , Disp: , Rfl:  .  predniSONE (DELTASONE) 10 MG tablet, '40mg'$ X2 days, '30mg'$  X2 days, '20mg'$  X2 days, '10mg'$ X2 days, then stop., Disp: 20 tablet, Rfl: 0   Past Medical History  Diagnosis Date  . COPD (chronic obstructive pulmonary disease) ([place])   . Chest pain     Cardiac catheterization in 1999, 2003, 2008-normal EF, normal coronaries  . GERD (gastroesophageal reflux disease)   . Hypertension   . Meige disease (lymphedema praecox)     blepharospasm;  followed at [facility] [place] treated with botulinum toxin injections  . Osteoarthritis   . Breast cancer ([place])     Left mastectomy in 1990s + chemotherapy  . Hyperlipidemia   . Anxiety and depression     H/o suicide attempt  . Skull fracture [facility]) 1969    surgical repair in 1969  . Hearing impairment   . Tobacco abuse     45 pack years  . Obesity   . Pulmonary hypertension ([facility])     Allergies  Allergen Reactions  . Artane [Trihexyphenidyl] Other (See Comments)    tachycardia  . Aspirin Other (See Comments)    + nonsteroidals-tachycardia  . Cogentin [Benztropine Mesylate] Other (See Comments)    Tachycardia   . Demerol Other (See Comments)    Delirium  . Inderal [Propranolol Hcl]  Other (See Comments)    Psychosis  . Levofloxacin Other (See Comments)    Muscle weakness  . Paxil [Paroxetine Hcl] Other (See Comments)    Anxiety   . Procardia [Nifedipine]   . Sulfa Antibiotics   . Penicillins Rash     Review of Systems    Constitutional:   No  weight loss, night sweats,  Fevers, chills, fatigue, or  lassitude.  HEENT:   No headaches,  Difficulty swallowing,  Tooth/dental problems, or  Sore throat,                No sneezing, itching, ear ache, nasal congestion, post nasal drip,   CV:  No chest pain,  Orthopnea, PND, swelling in lower extremities, anasarca, dizziness, palpitations, syncope.   GI  No heartburn, indigestion, abdominal pain, nausea, vomiting, diarrhea, change in bowel habits, loss of appetite, bloody stools.   Resp: + shortness of breath with exertion less at rest.  No excess mucus, no productive cough,  No non-productive cough,  No coughing up of blood.  No change in color of mucus.  No wheezing.  No chest wall deformity  Skin: no rash or lesions.  GU: no dysuria, change in color of urine, no urgency or frequency.  No flank pain, no hematuria   MS:  No joint pain or swelling.  No decreased range of motion.  No back pain.  Psych:  No change in mood or affect. No depression or anxiety.  No memory loss.      Objective:   Physical Exam  BP 132/74 mmHg  Pulse 104  Temp(Src) 97.5 F (36.4 C) (Oral)  Ht '5\' 6"'$  (1.676 m)  Wt 192 lb 12.8 oz (87.454 kg)  BMI 31.13 kg/m2  SpO2 90%  Physical Exam:  General- No distress,  A&Ox3 ENT: No sinus tenderness, TM clear, pale nasal mucosa, no oral exudate,no post nasal drip, no LAN Cardiac: S1, S2, regular rate and rhythm, no murmur Chest: No wheeze/ rales/ dullness; no accessory muscle use, no nasal flaring, no sternal retractions Abd.: Soft Non-tender Ext: + clubbing cyanosis, trace ankle edema Neuro:  normal strength Skin: No rashes, warm and dry Psych: normal mood and behavior    Magdalen Spatz, AGACNP-BC [facility] 11/06/2015 Assessment & Plan:  COPD/Flare Plan: Please remember the single most powerful action that you can take to improve your lung function is to quit smoking. We will check a CXR today We will call you with the results We will collect labs ( CBC, Pro BNP) We will call with results. Prednisone taper; 10 mg tablets: 4 tabs x 2 days, 3 tabs x 2 days, 2 tabs x  2 days 1 tab x 2 days then stop. We will add Mucinex twice daily with a full glass of water  For chest congestion. Continue Claritin daily Add flonase nasal spray 2 puffs in each nostril daily We will set you up for oxygen at home. Please wear it at 2L Call back if not better in 2-3 days. Follow up in 4 weeks with Dr. [place] Bells or Judson Roch. Please contact office for sooner follow up if symptoms do not improve or worsen or seek emergency care .   Attending:  I have seen and examined the patient with nurse practitioner/resident and agree with the note above.  We formulated the plan together and I elicited the following history.    [name] has always had significant dyspnea, hence starting pulmonary hypertension medication. However, she's had increasing shortness of breath and hypoxemia recently with some dry cough, though this is not a primary complaint. Once again her lungs are clear on auscultation today.  We will plan on treating this problem with her prednisone taper given her airways disease and the fact that it started with grass exposure plan we will also start oxygen to use with exertion. We will check a chest x-ray to look for any sort of other abnormality such as pulmonary edema and we will check a CBC and a proBNP. However, she does not improve in the next 2-3 days of asked her to return to the we can reassess. We may need to consider evaluating for pulmonary embolism (which think is much less likely) or diuretic therapy.  > 45 minutes spent on today's visit total in  office, more than 50% of which was face to face  Roselie Awkward, MD [facility] Pager: [phone] Cell: [phone] After 3pm or if no response, call [phone]

## 2015-11-06 NOTE — Assessment & Plan Note (Addendum)
COPD/Flare Plan: Please remember the single most powerful action that you can take to improve your lung function is to quit smoking. We will check a CXR today We will call you with the results We will collect labs ( CBC, Pro BNP) We will call with results. Prednisone taper; 10 mg tablets: 4 tabs x 2 days, 3 tabs x 2 days, 2 tabs x 2 days 1 tab x 2 days then stop. We will add Mucinex twice daily with a full glass of water  For chest congestion. Continue Claritin daily Add flonase nasal spray 2 puffs in each nostril daily We will set you up for oxygen at home. Please wear it at 3L Overnight oximetry test Call back if not better in 2-3 days Follow up in 4 weeks with Dr. [place] Bells or Judson Roch. Please contact office for sooner follow up if symptoms do not improve or worsen or seek emergency care .

## 2015-11-07 ENCOUNTER — Other Ambulatory Visit: Payer: Self-pay

## 2015-11-07 LAB — PRO B NATRIURETIC PEPTIDE: Pro B Natriuretic peptide (BNP): 357.5 pg/mL — ABNORMAL HIGH (ref <126)

## 2015-11-07 MED ORDER — FUROSEMIDE 40 MG PO TABS: 40.0000 mg | ORAL_TABLET | Freq: Every day | ORAL | Status: DC

## 2015-11-12 ENCOUNTER — Telehealth: Payer: Self-pay | Admitting: Internal Medicine

## 2015-11-12 ENCOUNTER — Encounter: Payer: Self-pay | Admitting: Pulmonary Disease

## 2015-11-12 NOTE — Telephone Encounter (Signed)
5.15.17 mychart e-mail from patient: Message     Update from appt. O2 levels better even without O2 90-91%. Weight went from 192 to186 with fluid pill. My worry will fluid and weight return without some type fluid pill. Also nightime O2 levels when using O2 is round 85%. The nightime O2 monitor that was ordered the oxygen company did not bring.still have some cough when fluid pill has done its job I feel less tightness returning to chest. Thank you Rinda   Pt seen 5.9.17 by BQ/SG: Patient Instructions       It is good to see you today. I am sorry you are not feeling well. Please remember the single most powerful action that you can take to improve your lung function is to quit smoking. We will check a CXR today We will call you with the results We will collect labs ( CBC, Pro BNP) We will call with results. Prednisone taper; 10 mg tablets: 4 tabs x 2 days, 3 tabs x 2 days, 2 tabs x 2 days 1 tab x 2 days then stop. Add flonase nasal spray 2 puffs in each nostril daily We will add Mucinex twice daily with a full glass of water  For chest congestion. We will set you up for oxygen at home. Please wear it at 3 L with exertion We will check an overnight oximetry test to see if you are having drops in your oxygen at night Call back if not better in 2-3 days Follow up in 4 weeks with Dr. [place] Bells or Judson Roch. Please contact office for sooner follow up if symptoms do not improve or worsen or seek emergency care .   Email sent to patient informing her will forward her question about the Lasix to BQ, though she does have a cardiologist so this may be deferred to that office because of her CHF.  Called [employer] to inquire about the ONO but all resp therapists are gone for the day.

## 2015-11-12 NOTE — Telephone Encounter (Signed)
RECALL FOR TCS °

## 2015-11-13 MED ORDER — FUROSEMIDE 40 MG PO TABS: 40.0000 mg | ORAL_TABLET | Freq: Every day | ORAL | Status: DC | PRN

## 2015-11-13 NOTE — Telephone Encounter (Signed)
Letter mailed

## 2015-11-13 NOTE — Telephone Encounter (Signed)
Per pt's chart: Routing History     Priority Sent On From To Message Type     11/13/2015 12:16 PM Juanito Doom, MD Rinaldo Ratel, CMA Pt Advice Request     [facility]     Message     Please Rx '40mg'$  furosemide daily prn.    Instructions> use one pill daily if weight > 189 pounds    Dispense #30, refill 5    Rx sent E-mail sent to pt to inform her BQ okayed the refill Nothing further needed; will sign off

## 2015-11-14 ENCOUNTER — Encounter: Payer: Self-pay | Admitting: Pulmonary Disease

## 2015-11-14 DIAGNOSIS — I272 Pulmonary hypertension, unspecified: Secondary | ICD-10-CM

## 2015-11-15 NOTE — Telephone Encounter (Signed)
BQ - please advise. Thanks. 

## 2015-11-16 NOTE — Telephone Encounter (Signed)
Per BQ: Please order humidifier for oxygen

## 2015-11-20 ENCOUNTER — Other Ambulatory Visit: Payer: Self-pay | Admitting: *Deleted

## 2015-11-20 MED ORDER — UMECLIDINIUM BROMIDE 62.5 MCG/INH IN AEPB: 1.0000 | INHALATION_SPRAY | Freq: Every day | RESPIRATORY_TRACT | Status: DC

## 2015-11-27 ENCOUNTER — Ambulatory Visit (INDEPENDENT_AMBULATORY_CARE_PROVIDER_SITE_OTHER): Payer: Medicare HMO | Admitting: Pulmonary Disease

## 2015-11-27 ENCOUNTER — Encounter: Payer: Self-pay | Admitting: Pulmonary Disease

## 2015-11-27 VITALS — BP 138/82 | HR 106 | Ht 66.0 in | Wt 190.0 lb

## 2015-11-27 DIAGNOSIS — F17200 Nicotine dependence, unspecified, uncomplicated: Secondary | ICD-10-CM | POA: Diagnosis not present

## 2015-11-27 DIAGNOSIS — R1310 Dysphagia, unspecified: Secondary | ICD-10-CM

## 2015-11-27 DIAGNOSIS — J438 Other emphysema: Secondary | ICD-10-CM

## 2015-11-27 DIAGNOSIS — Z5181 Encounter for therapeutic drug level monitoring: Secondary | ICD-10-CM | POA: Diagnosis not present

## 2015-11-27 DIAGNOSIS — I272 Other secondary pulmonary hypertension: Secondary | ICD-10-CM

## 2015-11-27 DIAGNOSIS — R590 Localized enlarged lymph nodes: Secondary | ICD-10-CM

## 2015-11-27 DIAGNOSIS — R599 Enlarged lymph nodes, unspecified: Secondary | ICD-10-CM

## 2015-11-27 MED ORDER — PREDNISONE 10 MG PO TABS: ORAL_TABLET | ORAL | Status: DC

## 2015-11-27 NOTE — Progress Notes (Signed)
Subjective:    Patient ID: Kathleen Burns, female    DOB: 01/22/1956, 60 y.o.   MRN: [id number]  Synopsis: Former patient of Dr. Gwenette Greet with COPD and pulmonary hypertension Echo 04/2012:  Normal EF, LVH with diastolic dysfunction and elevated LV end-diastolic pressure, mildly dilated LA, mild decrease in RV function, PA peak pressure 55m. [place]:  Mean PA 35, PCWP 15, Fick CO 4.2, PVR 4-5 Felt to be due to her copd, as well as diastolic dysfxn. ONO RA 2014: desat to 80% >> started on nocturnal oxygen.  PFT's 08/2012:  FEV1 2.11 (75%), ratio 66, +airtrapping, no restriction, DLCO 38%. ONO 2014:  desat to 80% >> started on nocturnal oxygen >> turned back in.  Did not want. September 2016 pulmonary function testing ratio 67%, FEV1 1.86 L (67% predicted), total lung capacity 6.03 L (112% predicted), DLCO 7.97 (29% predicted), residual volume 3.34 L (159% predicted). Started Letairis March 2017.    HPI Chief Complaint  Patient presents with  . Follow-up    pt did well for 2 days after completing pred taper given at last visit, is now feeling worse- sob with exertion, weakness, prod cough with brown mucus.     Prednisone really helped a lot.  She started feeling less short of breath right away, her energy level was up and she was really feeling a lot better.  However she started feeling much worse with a fever at night, some night sweats, chills.   She is still taking Letairis.  She has leg swelling which is persistent but in general it is better.  Has only taken lasix 2-3 times.    She will cough up milky brown mucus at night sometimes.  She continues to smoke.    She notes that food gets stuck in her chest whenever she eats.    Past Medical History  Diagnosis Date  . [place]   . Chest pain     Cardiac catheterization in 1999, 2003, 2008-normal EF, normal coronaries  . GERD (gastroesophageal reflux disease)   . Hypertension   . Meige  disease (lymphedema praecox)     [facility]  . Osteoarthritis   . Breast cancer ([place]     Left mastectomy in 1990s + chemotherapy  . Hyperlipidemia   . Anxiety and depression     H/o suicide attempt  . Skull fracture ([facility] 1969    surgical repair in 1969  . Hearing impairment   . Tobacco abuse     45 pack years  . Obesity   . Pulmonary hypertension ([place]       Review of Systems  Constitutional: Positive for fatigue. Negative for fever and chills.  HENT: Negative for postnasal drip, rhinorrhea and sinus pressure.   Respiratory: Positive for cough and shortness of breath. Negative for wheezing.   Cardiovascular: Positive for chest pain. Negative for palpitations and leg swelling.       Objective:   Physical Exam Filed Vitals:   11/27/15 1453  BP: 138/82  Pulse: 106  Height: _0  (1.676 m)  Weight: 190 lb (86.183 kg)  SpO2: 89%   RA  Gen: chronically ill appearing HENT: OP clear, TM's clear, neck supple PULM: Crackles bases, normal percussion CV: RRR, gallop noted, mild JVD, notable pretibial edema GI: BS+, soft, nontender Derm: no cyanosis or rash Psyche: normal mood and affect  March 2016 CT chest images personally reviewed showing a very  large hiatal hernia, significant emphysema bilaterally, and mucin lymphadenopathy. There are stable nodules.      Assessment & Plan:  Dysphagia She has significant dysphagia and an "inverted" stomach which is completely in her thorax on her March 2017 CT chest. I'm sure this is a cause of her dysphagia.  Plan: Start With barium swallow Refer back to GI  Mediastinal lymphadenopathy I've seen the images of mediastinal lymphadenopathy in her chest which she said were visualizing the time when she is acutely ill with an upper respiratory infection. Based on this, I hope that the mediastinal lymphadenopathy was just reactive.  Plan: Repeat CT chest June  2017 Depending on findings from the CT chest we may need to consider biopsy.  Tobacco use disorder Advised at length to quit smoking again today.  COPD (chronic obstructive pulmonary disease) with emphysema Clearly her emphysema with moderate airflow obstruction contributes to her dyspnea. However, her picture is at best met with some degree of pulmonary hypertension contributing as well.   Based on her significant improvement with prednisone beginning to believe that the majority of her symptoms are coming from her centrilobular emphysema and air trapping.  Her most recent lab work did not show evidence of an elevated serum IgE or eosinophils.  Plan: Given her clinical improvement with prednisone and going to give a long slow taper to see if this improves her symptoms During this time I would like for her to quit smoking and exercise more regularly Continue Spiriva and Symbicort  Pulmonary hypertension ([place]) Clearly a complicated issue. I think she is mostly Lexicographer group 3. I'm going to treat her COPD aggressively as above and repeat a 6 minute walk test on the next visit. If we don't see a significant improvement compared to her baseline then I may consider stopping the Letairis this year as most of the evidence available to [name] at this time would suggest that patients with her degree of emphysema would not benefit from that medicine.     Current outpatient prescriptions:  .  albuterol (PROAIR HFA) 108 (90 BASE) MCG/ACT inhaler, Inhale 2 puffs into the lungs 4 (four) times daily as needed. For shortness of breath , Disp: , Rfl:  .  ambrisentan (LETAIRIS) 5 MG tablet, Take 5 mg by mouth daily., Disp: , Rfl:  .  amLODipine (NORVASC) 5 MG tablet, Take 1 tablet (5 mg total) by mouth daily., Disp: 90 tablet, Rfl: 3 .  cetirizine (ZYRTEC) 10 MG tablet, Take 10 mg by mouth as needed for allergies. , Disp: , Rfl:  .  fluticasone furoate-vilanterol (BREO ELLIPTA) 100-25  MCG/INH AEPB, Inhale 1 puff into the lungs daily., Disp: 90 each, Rfl: 3 .  furosemide (LASIX) 40 MG tablet, Take 1 tablet (40 mg total) by mouth daily as needed (if your weight is over 189 lbs)., Disp: 30 tablet, Rfl: 5 .  HYDROcodone-acetaminophen (NORCO) 10-325 MG per tablet, Take 1 tablet by mouth 2 (two) times daily. For pain, Disp: , Rfl:  .  LORazepam (ATIVAN) 1 MG tablet, Take 1 mg by mouth every 8 (eight) hours.  , Disp: , Rfl:  .  metoprolol tartrate (LOPRESSOR) 25 MG tablet, Take 0.5 tablets (12.5 mg total) by mouth 2 (two) times daily., Disp: 90 tablet, Rfl: 3 .  montelukast (SINGULAIR) 10 MG tablet, Take 10 mg by mouth at bedtime., Disp: , Rfl:  .  Morphine-Naltrexone (EMBEDA) 60-2.4 MG CPCR, Take 1 capsule by mouth 2 (two) times daily., Disp: , Rfl:  .  nitroGLYCERIN (NITROSTAT) 0.4 MG SL tablet, Place 1 tablet (0.4 mg total) under the tongue every 5 (five) minutes as needed for chest pain., Disp: 25 tablet, Rfl: 3 .  omeprazole (PRILOSEC) 20 MG capsule, Take 20 mg by mouth 2 (two) times daily.  , Disp: , Rfl:  .  ranolazine (RANEXA) 500 MG 12 hr tablet, Take 1 tablet (500 mg total) by mouth 2 (two) times daily., Disp: 180 tablet, Rfl: 3 .  Tiotropium Bromide Monohydrate (SPIRIVA RESPIMAT) 2.5 MCG/ACT AERS, Inhale 2 puffs into the lungs every morning., Disp: 3 Inhaler, Rfl: 2 .  umeclidinium bromide (INCRUSE ELLIPTA) 62.5 MCG/INH AEPB, Inhale 1 puff into the lungs daily., Disp: 90 each, Rfl: 1 .  zolpidem (AMBIEN CR) 12.5 MG CR tablet, Take 12.5 mg by mouth at bedtime.  , Disp: , Rfl:  .  predniSONE (DELTASONE) 10 MG tablet, Take 30 mg daily for 2 weeks followed by 20 mg daily for 2 weeks followed by 10 mg daily for 2 weeks then stop, Disp: 90 tablet, Rfl: 0

## 2015-11-27 NOTE — Patient Instructions (Addendum)
Keep taking Letairis as you're doing We will call you with the results of today's bloodwork We will schedule barium swallow test Take the prednisone as prescribed We will repeat a CT chest to evaluate her mediastinal lymphadenopathy When you return in 6 weeks we will perform a 6 minute walk that day

## 2015-11-27 NOTE — Assessment & Plan Note (Signed)
She has significant dysphagia and an "inverted" stomach which is completely in her thorax on her March 2017 CT chest. I'm sure this is a cause of her dysphagia.  Plan: Start With barium swallow Refer back to GI

## 2015-11-27 NOTE — Assessment & Plan Note (Signed)
Advised at length to quit smoking again today.

## 2015-11-27 NOTE — Assessment & Plan Note (Signed)
I've seen the images of mediastinal lymphadenopathy in her chest which she said were visualizing the time when she is acutely ill with an upper respiratory infection. Based on this, I hope that the mediastinal lymphadenopathy was just reactive.  Plan: Repeat CT chest June 2017 Depending on findings from the CT chest we may need to consider biopsy.

## 2015-11-27 NOTE — Assessment & Plan Note (Signed)
Clearly her emphysema with moderate airflow obstruction contributes to her dyspnea. However, her picture is at best met with some degree of pulmonary hypertension contributing as well.   Based on her significant improvement with prednisone beginning to believe that the majority of her symptoms are coming from her centrilobular emphysema and air trapping.  Her most recent lab work did not show evidence of an elevated serum IgE or eosinophils.  Plan: Given her clinical improvement with prednisone and going to give a long slow taper to see if this improves her symptoms During this time I would like for her to quit smoking and exercise more regularly Continue Spiriva and Symbicort

## 2015-11-27 NOTE — Assessment & Plan Note (Signed)
Clearly a complicated issue. I think she is mostly Lexicographer group 3. I'm going to treat her COPD aggressively as above and repeat a 6 minute walk test on the next visit. If we don't see a significant improvement compared to her baseline then I may consider stopping the Letairis this year as most of the evidence available to [name] at this time would suggest that patients with her degree of emphysema would not benefit from that medicine.

## 2015-12-04 ENCOUNTER — Encounter: Payer: Self-pay | Admitting: Acute Care

## 2015-12-04 NOTE — Telephone Encounter (Signed)
Sarah- please advise. °

## 2015-12-06 ENCOUNTER — Other Ambulatory Visit (INDEPENDENT_AMBULATORY_CARE_PROVIDER_SITE_OTHER): Payer: Medicare HMO

## 2015-12-06 ENCOUNTER — Ambulatory Visit (HOSPITAL_COMMUNITY)
Admission: RE | Admit: 2015-12-06 | Discharge: 2015-12-06 | Disposition: A | Discharge status: Home / Self Care | Payer: Medicare HMO | Source: Ambulatory Visit | Attending: Pulmonary Disease | Admitting: Pulmonary Disease

## 2015-12-06 ENCOUNTER — Encounter (HOSPITAL_COMMUNITY): Payer: Self-pay

## 2015-12-06 DIAGNOSIS — I281 Aneurysm of pulmonary artery: Secondary | ICD-10-CM | POA: Diagnosis not present

## 2015-12-06 DIAGNOSIS — R599 Enlarged lymph nodes, unspecified: Secondary | ICD-10-CM | POA: Diagnosis present

## 2015-12-06 DIAGNOSIS — R918 Other nonspecific abnormal finding of lung field: Secondary | ICD-10-CM | POA: Diagnosis not present

## 2015-12-06 DIAGNOSIS — K222 Esophageal obstruction: Secondary | ICD-10-CM | POA: Diagnosis not present

## 2015-12-06 DIAGNOSIS — J439 Emphysema, unspecified: Secondary | ICD-10-CM | POA: Insufficient documentation

## 2015-12-06 DIAGNOSIS — Z5181 Encounter for therapeutic drug level monitoring: Secondary | ICD-10-CM | POA: Diagnosis not present

## 2015-12-06 DIAGNOSIS — R1310 Dysphagia, unspecified: Secondary | ICD-10-CM | POA: Diagnosis present

## 2015-12-06 DIAGNOSIS — K449 Diaphragmatic hernia without obstruction or gangrene: Secondary | ICD-10-CM

## 2015-12-06 DIAGNOSIS — R590 Localized enlarged lymph nodes: Secondary | ICD-10-CM

## 2015-12-06 DIAGNOSIS — I2510 Atherosclerotic heart disease of native coronary artery without angina pectoris: Secondary | ICD-10-CM | POA: Diagnosis not present

## 2015-12-06 LAB — HEPATIC FUNCTION PANEL
ALK PHOS: 68 U/L (ref 39–117)
ALT: 7 U/L (ref 0–35)
AST: 8 U/L (ref 0–37)
Albumin: 3.9 g/dL (ref 3.5–5.2)
BILIRUBIN DIRECT: 0 mg/dL (ref 0.0–0.3)
TOTAL PROTEIN: 6.7 g/dL (ref 6.0–8.3)
Total Bilirubin: 0.4 mg/dL (ref 0.2–1.2)

## 2015-12-07 ENCOUNTER — Other Ambulatory Visit: Payer: Self-pay

## 2015-12-07 DIAGNOSIS — K449 Diaphragmatic hernia without obstruction or gangrene: Secondary | ICD-10-CM | POA: Insufficient documentation

## 2015-12-07 DIAGNOSIS — T17908A Unspecified foreign body in respiratory tract, part unspecified causing other injury, initial encounter: Secondary | ICD-10-CM

## 2015-12-14 ENCOUNTER — Other Ambulatory Visit: Payer: Self-pay | Admitting: *Deleted

## 2015-12-14 MED ORDER — FLUTICASONE FUROATE-VILANTEROL 100-25 MCG/INH IN AEPB: 1.0000 | INHALATION_SPRAY | Freq: Every day | RESPIRATORY_TRACT | Status: DC

## 2015-12-31 ENCOUNTER — Ambulatory Visit: Payer: Medicare HMO | Admitting: Cardiovascular Disease

## 2016-01-18 ENCOUNTER — Ambulatory Visit: Payer: Medicare HMO

## 2016-01-18 ENCOUNTER — Ambulatory Visit: Payer: Medicare HMO | Admitting: Pulmonary Disease

## 2016-02-27 ENCOUNTER — Ambulatory Visit (INDEPENDENT_AMBULATORY_CARE_PROVIDER_SITE_OTHER): Payer: Medicare HMO | Admitting: Pulmonary Disease

## 2016-02-27 ENCOUNTER — Encounter: Payer: Self-pay | Admitting: Pulmonary Disease

## 2016-02-27 ENCOUNTER — Other Ambulatory Visit: Payer: Medicare HMO

## 2016-02-27 VITALS — BP 116/82 | HR 101 | Ht 66.0 in | Wt 174.2 lb

## 2016-02-27 DIAGNOSIS — Z23 Encounter for immunization: Secondary | ICD-10-CM

## 2016-02-27 DIAGNOSIS — K449 Diaphragmatic hernia without obstruction or gangrene: Secondary | ICD-10-CM | POA: Diagnosis not present

## 2016-02-27 DIAGNOSIS — J438 Other emphysema: Secondary | ICD-10-CM

## 2016-02-27 DIAGNOSIS — F17200 Nicotine dependence, unspecified, uncomplicated: Secondary | ICD-10-CM | POA: Diagnosis not present

## 2016-02-27 DIAGNOSIS — R599 Enlarged lymph nodes, unspecified: Secondary | ICD-10-CM

## 2016-02-27 DIAGNOSIS — R590 Localized enlarged lymph nodes: Secondary | ICD-10-CM

## 2016-02-27 DIAGNOSIS — R0600 Dyspnea, unspecified: Secondary | ICD-10-CM

## 2016-02-27 DIAGNOSIS — R1310 Dysphagia, unspecified: Secondary | ICD-10-CM

## 2016-02-27 DIAGNOSIS — Z5181 Encounter for therapeutic drug level monitoring: Secondary | ICD-10-CM

## 2016-02-27 LAB — HEPATIC FUNCTION PANEL
ALK PHOS: 59 U/L (ref 33–130)
ALT: 5 U/L — ABNORMAL LOW (ref 6–29)
AST: 9 U/L — AB (ref 10–35)
Albumin: 3.8 g/dL (ref 3.6–5.1)
BILIRUBIN TOTAL: 0.4 mg/dL (ref 0.2–1.2)
Bilirubin, Direct: 0.1 mg/dL (ref <=0.2)
Indirect Bilirubin: 0.3 mg/dL (ref 0.2–1.2)
TOTAL PROTEIN: 6.6 g/dL (ref 6.1–8.1)

## 2016-02-27 NOTE — Patient Instructions (Signed)
Stop smoking Use use oxygen at 3 L with exertion Keep taking your inhaled medicines as you're doing Exercise more We will give me a pneumonia vaccine on the next visit Keep taking your other medicines as you are doing We will see you back in 3 months

## 2016-02-27 NOTE — Assessment & Plan Note (Signed)
As above.

## 2016-02-27 NOTE — Assessment & Plan Note (Signed)
Noted on recent barium swallow which was performed to assess her large hiatal hernia and dysphagia. She refuses to undergo speech therapy treatment. However, she continues to struggle with dysphagia sign going to refer her to gastroenterology for further evaluation.

## 2016-02-27 NOTE — Progress Notes (Signed)
Subjective:    Patient ID: Kathleen Burns, female    DOB: 05-03-1956, 60 y.o.   MRN: [id number]  Synopsis: Former patient of Dr. Gwenette Greet with COPD and pulmonary hypertension Echo 04/2012:  Normal EF, LVH with diastolic dysfunction and elevated LV end-diastolic pressure, mildly dilated LA, mild decrease in RV function, PA peak pressure 41m. [place]:  Mean PA 35, PCWP 15, Fick CO 4.2, PVR 4-5 Felt to be due to her copd, as well as diastolic dysfxn. ONO RA 2014: desat to 80% >> started on nocturnal oxygen.  PFT's 08/2012:  FEV1 2.11 (75%), ratio 66, +airtrapping, no restriction, DLCO 38%. ONO 2014:  desat to 80% >> started on nocturnal oxygen >> turned back in.  Did not want. September 2016 pulmonary function testing ratio 67%, FEV1 1.86 L (67% predicted), total lung capacity 6.03 L (112% predicted), DLCO 7.97 (29% predicted), residual volume 3.34 L (159% predicted). Started Letairis March 2017.   Started exertional oxygen again in summer 2017 01/2016 6MW 1411mO2 saturation 90% on 3L [place]  HPI Chief Complaint  Patient presents with  . Follow-up    review 67m467m pt has difficulty moving 02 tank d/t back problems.  c/o SOB with exertion.      Kathleen Burns has been having a lot of back and joint pain lately. She continues to struggle with dyspnea on exertion and has limited her activity more because of it. She has not been exercising at all since the last visit. Her partner says that she has been struggling to exercise because she can't carry her oxygen. She has been struggling with slipping in bed, going to the bathroom due to dyspnea. She continues to smoke 2 packs per day which is much better than prior.    Past Medical History:  Diagnosis Date  . Anxiety and depression    H/o suicide attempt  . Breast cancer ([facility]  Left mastectomy in 1990s + chemotherapy  . Chest pain    Cardiac catheterization in 1999, 2003, 2008-normal EF, normal coronaries  . COPD (chronic obstructive pulmonary  disease) ([facility] . GERD (gastroesophageal reflux disease)   . Hearing impairment   . Hyperlipidemia   . Hypertension   . Meige disease (lymphedema praecox)    [facility]; [facility]  . Obesity   . Osteoarthritis   . Pulmonary hypertension ([facility] . [facility]   surgical repair in 1969  . Tobacco abuse    45 pack years      Review of Systems  Constitutional: Positive for fatigue. Negative for chills and fever.  HENT: Negative for postnasal drip, rhinorrhea and sinus pressure.   Respiratory: Positive for cough and shortness of breath. Negative for wheezing.   Cardiovascular: Positive for chest pain. Negative for palpitations and leg swelling.       Objective:   Physical Exam Vitals:   02/27/16 1516  BP: 116/82  Pulse: (!) 101  SpO2: 90%  Weight: 174 lb 3.2 oz (79 kg)  Height: '5\' 6"'$  (1.676 m)   RA  Gen: chronically ill appearing HENT: OP clear, TM's clear, neck supple PULM: CTA B today, normal percussion CV: RRR, gallop noted, mild JVD GI: BS+, soft, nontender Derm: no cyanosis or rash Psyche: normal mood and affect  June 2017 CT chest images personally reviewed showing stable nodules and decreased size of the lymph nodes.     Assessment & Plan:  Pulmonary hypertension (HCC) Stable  interval for now, continue Letairis at current dosing. Repeat 6 minute walk test in 6 months. Repeat LFTs next visit.  COPD (chronic obstructive pulmonary disease) with emphysema Stable interval but she continues to smoke.  Plan: Continue [place]ru[place] and Breo Flu shot today Pneumovax next visit Counseled to quit smoking  Tobacco use disorder She continues to smoke 2 packs of cigarettes daily. Discussed again today the importance of smoking cessation. I explained to her that ongoing tobacco use is going to greatly increase the chances of her death in the next year.  Mediastinal lymphadenopathy This is improved on  the June 2017 CT chest. The recommended follow-up imaging with lung cancer screening in 2018.  Dysphagia Noted on recent barium swallow which was performed to assess her large hiatal hernia and dysphagia. She refuses to undergo speech therapy treatment. However, she continues to struggle with dysphagia sign going to refer her to gastroenterology for further evaluation.  Hiatal hernia As above.    Current Outpatient Prescriptions:  .  albuterol (PROAIR HFA) 108 (90 BASE) MCG/ACT inhaler, Inhale 2 puffs into the lungs 4 (four) times daily as needed. For shortness of breath , Disp: , Rfl:  .  ambrisentan (LETAIRIS) 5 MG tablet, Take 5 mg by mouth daily., Disp: , Rfl:  .  amLODipine (NORVASC) 5 MG tablet, Take 1 tablet (5 mg total) by mouth daily., Disp: 90 tablet, Rfl: 3 .  cetirizine (ZYRTEC) 10 MG tablet, Take 10 mg by mouth as needed for allergies. , Disp: , Rfl:  .  fluticasone furoate-vilanterol (BREO ELLIPTA) 100-25 MCG/INH AEPB, Inhale 1 puff into the lungs daily., Disp: 90 each, Rfl: 3 .  furosemide (LASIX) 40 MG tablet, Take 1 tablet (40 mg total) by mouth daily as needed (if your weight is over 189 lbs)., Disp: 30 tablet, Rfl: 5 .  HYDROcodone-acetaminophen (NORCO) 10-325 MG per tablet, Take 1 tablet by mouth 2 (two) times daily. For pain, Disp: , Rfl:  .  LORazepam (ATIVAN) 1 MG tablet, Take 1 mg by mouth every 8 (eight) hours.  , Disp: , Rfl:  .  metoprolol tartrate (LOPRESSOR) 25 MG tablet, Take 0.5 tablets (12.5 mg total) by mouth 2 (two) times daily., Disp: 90 tablet, Rfl: 3 .  montelukast (SINGULAIR) 10 MG tablet, Take 10 mg by mouth at bedtime., Disp: , Rfl:  .  Morphine-Naltrexone (EMBEDA) 60-2.4 MG CPCR, Take 1 capsule by mouth 2 (two) times daily., Disp: , Rfl:  .  nitroGLYCERIN (NITROSTAT) 0.4 MG SL tablet, Place 1 tablet (0.4 mg total) under the tongue every 5 (five) minutes as needed for chest pain., Disp: 25 tablet, Rfl: 3 .  omeprazole (PRILOSEC) 20 MG capsule, Take 20 mg  by mouth 2 (two) times daily.  , Disp: , Rfl:  .  ranolazine (RANEXA) 500 MG 12 hr tablet, Take 1 tablet (500 mg total) by mouth 2 (two) times daily., Disp: 180 tablet, Rfl: 3 .  umeclidinium bromide (INCRUSE ELLIPTA) 62.5 MCG/INH AEPB, Inhale 1 puff into the lungs daily., Disp: 90 each, Rfl: 1 .  zolpidem (AMBIEN CR) 12.5 MG CR tablet, Take 12.5 mg by mouth at bedtime.  , Disp: , Rfl:

## 2016-02-27 NOTE — Assessment & Plan Note (Signed)
Stable interval for now, continue Letairis at current dosing. Repeat 6 minute walk test in 6 months. Repeat LFTs next visit.

## 2016-02-27 NOTE — Assessment & Plan Note (Addendum)
She continues to smoke 2 packs of cigarettes daily. Discussed again today the importance of smoking cessation. I explained to her that ongoing tobacco use is going to greatly increase the chances of her death in the next year.

## 2016-02-27 NOTE — Assessment & Plan Note (Signed)
This is improved on the June 2017 CT chest. The recommended follow-up imaging with lung cancer screening in 2018.

## 2016-02-27 NOTE — Assessment & Plan Note (Signed)
Stable interval but she continues to smoke.  Plan: Continue Incruise and Breo Flu shot today Pneumovax next visit Counseled to quit smoking

## 2016-02-28 NOTE — Progress Notes (Signed)
Spoke with pt and notified of results per Dr.McQuaid. Pt verbalized understanding and denied any questions. 

## 2016-03-17 ENCOUNTER — Ambulatory Visit (INDEPENDENT_AMBULATORY_CARE_PROVIDER_SITE_OTHER): Payer: Medicare Other | Admitting: Cardiovascular Disease

## 2016-03-17 ENCOUNTER — Encounter: Payer: Self-pay | Admitting: Cardiovascular Disease

## 2016-03-17 VITALS — BP 120/83 | HR 61 | Ht 66.0 in | Wt 173.0 lb

## 2016-03-17 DIAGNOSIS — I10 Essential (primary) hypertension: Secondary | ICD-10-CM | POA: Diagnosis not present

## 2016-03-17 DIAGNOSIS — R0789 Other chest pain: Secondary | ICD-10-CM | POA: Diagnosis not present

## 2016-03-17 DIAGNOSIS — I272 Other secondary pulmonary hypertension: Secondary | ICD-10-CM

## 2016-03-17 DIAGNOSIS — I5032 Chronic diastolic (congestive) heart failure: Secondary | ICD-10-CM | POA: Diagnosis not present

## 2016-03-17 DIAGNOSIS — E785 Hyperlipidemia, unspecified: Secondary | ICD-10-CM

## 2016-03-17 MED ORDER — RANOLAZINE ER 500 MG PO TB12: 500.0000 mg | ORAL_TABLET | Freq: Two times a day (BID) | ORAL | 3 refills | Status: DC

## 2016-03-17 MED ORDER — AMLODIPINE BESYLATE 5 MG PO TABS: 5.0000 mg | ORAL_TABLET | Freq: Every day | ORAL | 3 refills | Status: DC

## 2016-03-17 MED ORDER — METOPROLOL TARTRATE 25 MG PO TABS: 12.5000 mg | ORAL_TABLET | Freq: Two times a day (BID) | ORAL | 3 refills | Status: DC

## 2016-03-17 NOTE — Progress Notes (Signed)
SUBJECTIVE: The patient is a 60 year old woman with a history of hypertension, hyperlipidemia, anxiety, COPD, tobacco abuse with chronic dyspnea, and nonobstructive coronary artery disease. She has a history of palpitations and metoprolol was previously attempted but she did not tolerate it, causing her to experience dizziness and lightheadedness.  I last saw her in November 2015. She has seen K. Lawrence NP since that time.  She underwent a low risk nuclear stress test on 03/13/15.  Echocardiogram on 05/26/2012 demonstrated normal left ventricular systolic function with mildly reduced right ventricular systolic function and severely elevated pulmonary pressures, 64 mmHg.  She follows with pulmonary. She is supposed be using 3 L of oxygen chronically but forgot to wear it today. Has occasional chest pains and chronic exertional dyspnea. Denies leg swelling. Takes Lasix.   Review of Systems: As per "subjective", otherwise negative.  Allergies  Allergen Reactions  . Artane [Trihexyphenidyl] Other (See Comments)    tachycardia  . Aspirin Other (See Comments)    + nonsteroidals-tachycardia  . Cogentin [Benztropine Mesylate] Other (See Comments)    Tachycardia   . Demerol Other (See Comments)    Delirium  . Inderal [Propranolol Hcl] Other (See Comments)    Psychosis  . Levofloxacin Other (See Comments)    Muscle weakness  . Paxil [Paroxetine Hcl] Other (See Comments)    Anxiety   . Procardia [Nifedipine]   . Sulfa Antibiotics   . Penicillins Rash    Current Outpatient Prescriptions  Medication Sig Dispense Refill  . albuterol (PROAIR HFA) 108 (90 BASE) MCG/ACT inhaler Inhale 2 puffs into the lungs 4 (four) times daily as needed. For shortness of breath     . ambrisentan (LETAIRIS) 5 MG tablet Take 5 mg by mouth daily.    Marland Kitchen amLODipine (NORVASC) 5 MG tablet Take 1 tablet (5 mg total) by mouth daily. 90 tablet 3  . cetirizine (ZYRTEC) 10 MG tablet Take 10 mg by mouth as  needed for allergies.     . fluticasone furoate-vilanterol (BREO ELLIPTA) 100-25 MCG/INH AEPB Inhale 1 puff into the lungs daily. 90 each 3  . furosemide (LASIX) 40 MG tablet Take 1 tablet (40 mg total) by mouth daily as needed (if your weight is over 189 lbs). 30 tablet 5  . HYDROcodone-acetaminophen (NORCO) 10-325 MG per tablet Take 1 tablet by mouth 2 (two) times daily. For pain    . LORazepam (ATIVAN) 1 MG tablet Take 1 mg by mouth every 8 (eight) hours.      . metoprolol tartrate (LOPRESSOR) 25 MG tablet Take 0.5 tablets (12.5 mg total) by mouth 2 (two) times daily. 90 tablet 3  . montelukast (SINGULAIR) 10 MG tablet Take 10 mg by mouth at bedtime.    . Morphine-Naltrexone (EMBEDA) 60-2.4 MG CPCR Take 1 capsule by mouth 2 (two) times daily.    . nitroGLYCERIN (NITROSTAT) 0.4 MG SL tablet Place 1 tablet (0.4 mg total) under the tongue every 5 (five) minutes as needed for chest pain. 25 tablet 3  . omeprazole (PRILOSEC) 20 MG capsule Take 20 mg by mouth 2 (two) times daily.      . ranolazine (RANEXA) 500 MG 12 hr tablet Take 1 tablet (500 mg total) by mouth 2 (two) times daily. 180 tablet 3  . umeclidinium bromide (INCRUSE ELLIPTA) 62.5 MCG/INH AEPB Inhale 1 puff into the lungs daily. 90 each 1  . zolpidem (AMBIEN CR) 12.5 MG CR tablet Take 12.5 mg by mouth at bedtime.  No current facility-administered medications for this visit.     Past Medical History:  Diagnosis Date  . Anxiety and depression    H/o suicide attempt  . Breast cancer ([place])    Left mastectomy in 1990s + chemotherapy  . Chest pain    Cardiac catheterization in 1999, 2003, 2008-normal EF, normal coronaries  . COPD (chronic obstructive pulmonary disease) ([place])   . GERD (gastroesophageal reflux disease)   . Hearing impairment   . Hyperlipidemia   . Hypertension   . 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 Obesity   . Osteoarthritis   .  Pulmonary hypertension ([place])   . Skull fracture [facility]) 1969   surgical repair in 1969  . Tobacco abuse    45 pack years    Past Surgical History:  Procedure Laterality Date  . [place] and  . CARPAL TUNNEL RELEASE  1996  . CHOLECYSTECTOMY  1996  . COLONOSCOPY  2011   No significant abnormalities  . [place], 2001  . INCISIONAL HERNIA REPAIR N/A 10/27/2014   Procedure: Fatima Blank HERNIORRHAPHY WITH MESH;  Surgeon: Aviva Signs Md, MD;  Location: [facility];  Service: General;  Laterality: N/A;  . INSERTION OF MESH N/A 10/27/2014   Procedure: INSERTION OF MESH;  Surgeon: Aviva Signs Md, MD;  Location: [facility];  Service: General;  Laterality: N/A;  . KNEE SURGERY  1999   Left  . MASTECTOMY  1990   Left  . SHOULDER SURGERY  1996   Left  . SKULL FRACTURE ELEVATION  1969  . TONSILLECTOMY    . TOTAL ABDOMINAL HYSTERECTOMY W/ BILATERAL SALPINGOOPHORECTOMY  1994    Social History   Social History  . Marital status: Single    Spouse name: N/A  . Number of children: 0  . Years of education: N/A   Occupational History  . Disabled     previously case Freight forwarder with [employer]  . Smoking status: Current Every Day Smoker    Packs/day: 2.00    Years: 30.00    Types: Cigarettes    Start date: 03/17/1974  . Smokeless tobacco: Never Used     Comment: Encouraged to quit smoking. Counseled on importance of smoking cessation.  . Alcohol use No  . Drug use: No     Comment: remote marijuana--QUIT SMOKING IN 1990's  . Sexual activity: Not on file   Other Topics Concern  . Not on file   Social History Narrative  . No narrative on file     Vitals:   03/17/16 1525  BP: 120/83  Pulse: 61  SpO2: 95%  Weight: 173 lb (78.5 kg)  Height: '5\' 6"'$  (1.676 m)    PHYSICAL EXAM General: NAD HEENT: Poor dentition Neck: No JVD, no thyromegaly. Lungs: Diminished air entry b/l, no rales or wheezes. CV: Nondisplaced PMI.  Regular rate and rhythm,  normal S1/loud P2, no S3/S4, no murmur. No pretibial or periankle edema.    Abdomen: Soft,no distention.  Neurologic: Alert and oriented.  Psych: Normal affect. Skin: Normal.    ECG: Most recent ECG reviewed.      ASSESSMENT AND PLAN: 1. CAD with exertional angina: Stable nonobstructive CAD. Continue Ranexa 500 mg bid.   2. Essential HTN: Controlled. No changes.  3. Tobacco abuse: Cessation counseling previously provided.  4. COPD: 23 with pulmonary. Stable on present regimen.   5. Pulmonary hypertension  with reduced RV function: Secondary to COPD. Stable, no lower extremity edema. Takes Lasix.  Dispo: f/u 1 year.   Kate Sable, M.D., F.A.C.C.

## 2016-03-17 NOTE — Patient Instructions (Signed)
Your physician wants you to follow-up in: 1 year with Dr Koneswaran You will receive a reminder letter in the mail two months in advance. If you don't receive a letter, please call our office to schedule the follow-up appointment.    Your physician recommends that you continue on your current medications as directed. Please refer to the Current Medication list given to you today.     Thank you for choosing [facility] !        

## 2016-03-28 ENCOUNTER — Encounter: Payer: Self-pay | Admitting: Pulmonary Disease

## 2016-03-28 ENCOUNTER — Telehealth: Payer: Self-pay | Admitting: Pulmonary Disease

## 2016-03-28 DIAGNOSIS — I272 Pulmonary hypertension, unspecified: Secondary | ICD-10-CM

## 2016-03-28 DIAGNOSIS — J438 Other emphysema: Secondary | ICD-10-CM

## 2016-03-28 NOTE — Telephone Encounter (Signed)
Pamala Hurry called back. I informed her of the below and why the patient did not qualify for POC. Also explained that [employer] is aware of the statement the therapist made to her and this is being investigated. Per Pamala Hurry pt does wear her O2 most of the time but not always.  Please see original message below Dr. Lake Bells, thanks

## 2016-03-28 NOTE — Telephone Encounter (Signed)
Per pt email sent 03/28/16: This is concerning the last visit where the portable concentrator was ordered. It took a while for [employer] to contact me,they finally came on the 27th. The main thing about the appt that day was a lot of confusion and the attitude of the therapist. Nothing was explain I thought she was to bring portable concentrator. She said she didn't come to [employer] everyday and she tried to call at least 5 times last Wednesday which we were not home due to Dr appt. She said she needed oxygen to put sonething on to see if I was going to be able to do concentrator. I got her the oxygen tank she put the thing on my finger and then just kept raising the tank up and said you can't do this that you know you are going to die took it off and got her stuff together and started to leave and said everyone has a bad day. When she started to leave I noticed she5had left something and she said thank you for being honest. I know this sounds confusing if you need to you may call. Thank you Kathleen Burns -------  Called pt regarding her email. She reports she was never told that she was going to be evaluated for the POC. She thought someone was going to just bring her the POC. I explained to pt that the DME will have to make sure she qualifies for this before she is set up with a POC to ensure she can tolerate the O2 level on the POC. Advised will call [employer] to see what happened regarding her testing. Pt then stated "she is sorry she even sent [name] this email". I advised pt that I am just trying to help her. She reports she is just confused about everything and she just wants to forget it.  I called [employer] and spoke with Estill Bamberg. She reports a therapist did go out and titrate patient. When the therapists arrived pt was not wearing her O2 and was sating at 79% on RA. Pt was placed on 3 liter pulsed and sats were around 84%. Pt then placed on 4 liters continuous and would not go any higher than 89%.   Pt does not  qualify for POC. I also informed Estill Bamberg about pt email and what she stated the therapists said to her. She will inform her manager to investigate this.  Called pt back to inform her why she did not qualify and LMTCB x1 Will forward to Dr. Lake Bells as an [place] as well.

## 2016-03-28 NOTE — Telephone Encounter (Signed)
Called pt regarding her email. She reports she was never told that she was going to be evaluated for the POC. She thought someone was going to just bring her the POC. I explained to pt that the DME will have to make sure she qualifies for this before she is set up with a POC to ensure she can tolerate the O2 level on the POC. Advised will call [employer] to see what happened regarding her testing. Pt then stated "she is sorry she even sent [name] this email". I advised pt that I am just trying to help her. She reports she is just confused about everything and she just wants to forget it.  I called [employer] and spoke with Estill Bamberg. She reports a therapist did go out and titrate patient. When the therapists arrived pt was not wearing her O2 and was sating at 79% on RA. Pt was placed on 3 liter pulsed and sats were around 84%. Pt then placed on 4 liters continuous and would not go any higher than 89%.   Pt does not qualify for POC. I also informed Estill Bamberg about pt email and what she stated the therapists said to her. She will inform her manager to investigate this.  Called pt back to inform her why she did not qualify and LMTCB x1 Will forward to Dr. Lake Bells as an [place] as well.

## 2016-03-28 NOTE — Telephone Encounter (Signed)
Barbara returned call, CB [phone].  States she lives with the patient and comes to all of her visits and can explain the email.

## 2016-03-31 NOTE — Telephone Encounter (Signed)
BQ -  Please see messages re: POC eval. Pt does not qualify. Thanks!

## 2016-04-02 ENCOUNTER — Encounter: Payer: Self-pay | Admitting: Gastroenterology

## 2016-04-04 NOTE — Telephone Encounter (Signed)
BQ has been out of the office this week Called spoke with Pamala Hurry to inform her this is why she has not heard back from this office.  Pamala Hurry okay with this and voiced her understanding.  She did mention that she thinks pt "will do better next time" if she could be retested for the POC.  BQ please advise on previous documentation and if pt could be retested.

## 2016-04-07 NOTE — Telephone Encounter (Signed)
Order placed. Nothing further needed. 

## 2016-04-07 NOTE — Telephone Encounter (Signed)
OK by me 

## 2016-04-07 NOTE — Telephone Encounter (Signed)
BQ - Pt would like a repeat POC eval as she did not pass the first one. Pt thinks that she can pass the second attempt.  Please advise if you are ok to order repeat POC eval. Thanks!

## 2016-04-07 NOTE — Telephone Encounter (Signed)
Will someone find me and explain what I need to do here? thanks

## 2016-04-18 ENCOUNTER — Encounter: Payer: Self-pay | Admitting: Pulmonary Disease

## 2016-04-21 NOTE — Telephone Encounter (Signed)
BQ,   This is an Pharmacist, hospital for you on patient. Thanks

## 2016-04-23 ENCOUNTER — Telehealth: Payer: Self-pay | Admitting: Acute Care

## 2016-04-23 ENCOUNTER — Other Ambulatory Visit: Payer: Self-pay | Admitting: Acute Care

## 2016-04-23 DIAGNOSIS — F17210 Nicotine dependence, cigarettes, uncomplicated: Secondary | ICD-10-CM

## 2016-04-23 NOTE — Telephone Encounter (Signed)
The results of Ms. Carters CT chest were callef to her by Dr. Kathee Delton Nurse in June. Per the recommendation that she return to the regular screening population in 08/2016, I have ordered the CT scan. She will be scheduled for 08/2016.

## 2016-05-07 ENCOUNTER — Ambulatory Visit: Payer: Medicare HMO | Admitting: Gastroenterology

## 2016-05-26 ENCOUNTER — Ambulatory Visit: Payer: Medicare HMO | Admitting: Pulmonary Disease

## 2016-05-27 ENCOUNTER — Encounter: Payer: Self-pay | Admitting: Gastroenterology

## 2016-05-27 ENCOUNTER — Ambulatory Visit (INDEPENDENT_AMBULATORY_CARE_PROVIDER_SITE_OTHER): Payer: Medicare Other | Admitting: Gastroenterology

## 2016-05-27 ENCOUNTER — Other Ambulatory Visit (HOSPITAL_COMMUNITY)
Admission: RE | Admit: 2016-05-27 | Discharge: 2016-05-27 | Disposition: A | Discharge status: Home / Self Care | Payer: Medicare Other | Source: Ambulatory Visit | Attending: Pulmonary Disease | Admitting: Pulmonary Disease

## 2016-05-27 VITALS — BP 88/70 | HR 92 | Ht 66.0 in | Wt 161.4 lb

## 2016-05-27 DIAGNOSIS — R1310 Dysphagia, unspecified: Secondary | ICD-10-CM

## 2016-05-27 DIAGNOSIS — K222 Esophageal obstruction: Secondary | ICD-10-CM

## 2016-05-27 DIAGNOSIS — Z9981 Dependence on supplemental oxygen: Secondary | ICD-10-CM | POA: Diagnosis not present

## 2016-05-27 DIAGNOSIS — K449 Diaphragmatic hernia without obstruction or gangrene: Secondary | ICD-10-CM | POA: Diagnosis not present

## 2016-05-27 DIAGNOSIS — I272 Pulmonary hypertension, unspecified: Secondary | ICD-10-CM | POA: Diagnosis not present

## 2016-05-27 DIAGNOSIS — K5900 Constipation, unspecified: Secondary | ICD-10-CM

## 2016-05-27 DIAGNOSIS — Z5181 Encounter for therapeutic drug level monitoring: Secondary | ICD-10-CM | POA: Insufficient documentation

## 2016-05-27 LAB — HEPATIC FUNCTION PANEL
ALBUMIN: 3.7 g/dL (ref 3.5–5.0)
ALT: 9 U/L — ABNORMAL LOW (ref 14–54)
AST: 14 U/L — ABNORMAL LOW (ref 15–41)
Alkaline Phosphatase: 71 U/L (ref 38–126)
BILIRUBIN TOTAL: 0.4 mg/dL (ref 0.3–1.2)
Bilirubin, Direct: <0.1 mg/dL — ABNORMAL LOW (ref 0.1–0.5)
TOTAL PROTEIN: 7 g/dL (ref 6.5–8.1)

## 2016-05-27 NOTE — Progress Notes (Signed)
05/27/2016 Kathleen Burns [id number] 1956-03-21   HISTORY OF PRESENT ILLNESS:  This is a 59 year old female who was referred to our office by Dr. Lake Bells for evaluation regarding dysphagia and a large hiatal hernia. She has COPD and is on oxygen 3 L. She has GI history with Dr. Sydell Axon in [place] probably 8 or so years ago. We do not have those records she reports having a colonoscopy that she recalls being unremarkable without polyps at that time.  She was recently evaluated with an esophagram due to complaints of dysphagia. This showed a small amount of silent aspiration for which speech consultation was suggested. She also had a large hiatal hernia with most of the stomach up in the chest without obstruction. Had mild smooth narrowing of the distal esophagus with no masses identified. It was recommended that she had a speech pathology evaluation but patient declined/refused to do that.   She reports food and pills getting stuck/hung up substernally. They eventually go down. She does take omeprazole 20 mg twice daily. His only able to eat a few bites of food at a time.  Also reports constipation for which she uses MiraLAX or other stool softener sporadically.  Past Medical History:  Diagnosis Date  . Anxiety and depression    H/o suicide attempt  . Breast cancer ([place])    Left mastectomy in 1990s + chemotherapy  . Chest pain    Cardiac catheterization in 1999, 2003, 2008-normal EF, normal coronaries  . COPD (chronic obstructive pulmonary disease) ([place])   . GERD (gastroesophageal reflux disease)   . Hearing impairment   . Hiatal hernia   . Hyperlipidemia   . Hypertension   . Meige disease (lymphedema praecox)    blepharospasm; followed at [facility] And treated with botulinum toxin injections  . Obesity   . Osteoarthritis   . Pulmonary hypertension   . Skull fracture [facility]) 1969   surgical repair in 1969  . Tobacco abuse    45 pack years   Past Surgical History:  Procedure  Laterality Date  . [place] and  . CARPAL TUNNEL RELEASE  1996  . CHOLECYSTECTOMY  1996  . COLONOSCOPY  2011   No significant abnormalities  . [place], 2001  . INCISIONAL HERNIA REPAIR N/A 10/27/2014   Procedure: Fatima Blank HERNIORRHAPHY WITH MESH;  Surgeon: Aviva Signs Md, MD;  Location: [facility];  Service: General;  Laterality: N/A;  . INSERTION OF MESH N/A 10/27/2014   Procedure: INSERTION OF MESH;  Surgeon: Aviva Signs Md, MD;  Location: [facility];  Service: General;  Laterality: N/A;  . KNEE SURGERY  1999   Left  . MASTECTOMY  1990   Left  . SHOULDER SURGERY  1996   Left  . SKULL FRACTURE ELEVATION  1969  . TONSILLECTOMY    . TOTAL ABDOMINAL HYSTERECTOMY W/ BILATERAL SALPINGOOPHORECTOMY  1994    reports that she has been smoking Cigarettes.  She started smoking about 42 years ago. She has a 60.00 pack-year smoking history. She has never used smokeless tobacco. She reports that she does not drink alcohol or use drugs. family history is not on file. Allergies  Allergen Reactions  . Artane [Trihexyphenidyl] Other (See Comments)    tachycardia  . Aspirin Other (See Comments)    + nonsteroidals-tachycardia  . Cogentin [Benztropine Mesylate] Other (See Comments)    Tachycardia   . Demerol Other (See Comments)    Delirium  . Inderal [Propranolol Hcl] Other (See Comments)  Psychosis  . Levofloxacin Other (See Comments)    Muscle weakness  . Paxil [Paroxetine Hcl] Other (See Comments)    Anxiety   . Procardia [Nifedipine]   . Sulfa Antibiotics   . Penicillins Rash      Outpatient Encounter Prescriptions as of 05/27/2016  Medication Sig  . albuterol (PROAIR HFA) 108 (90 BASE) MCG/ACT inhaler Inhale 2 puffs into the lungs 4 (four) times daily as needed. For shortness of breath   . ambrisentan (LETAIRIS) 5 MG tablet Take 5 mg by mouth daily.  Marland Kitchen amLODipine (NORVASC) 5 MG tablet Take 1 tablet (5 mg total) by mouth daily.  . cetirizine (ZYRTEC) 10 MG tablet  Take 10 mg by mouth as needed for allergies.   . fluticasone furoate-vilanterol (BREO ELLIPTA) 100-25 MCG/INH AEPB Inhale 1 puff into the lungs daily.  . furosemide (LASIX) 40 MG tablet Take 1 tablet (40 mg total) by mouth daily as needed (if your weight is over 189 lbs).  Marland Kitchen HYDROcodone-acetaminophen (NORCO) 10-325 MG per tablet Take 1 tablet by mouth 2 (two) times daily. For pain  . LORazepam (ATIVAN) 1 MG tablet Take 1 mg by mouth every 8 (eight) hours.    . metoprolol tartrate (LOPRESSOR) 25 MG tablet Take 0.5 tablets (12.5 mg total) by mouth 2 (two) times daily.  . montelukast (SINGULAIR) 10 MG tablet Take 10 mg by mouth at bedtime.  . Morphine-Naltrexone (EMBEDA) 60-2.4 MG CPCR Take 1 capsule by mouth 2 (two) times daily.  . nitroGLYCERIN (NITROSTAT) 0.4 MG SL tablet Place 1 tablet (0.4 mg total) under the tongue every 5 (five) minutes as needed for chest pain.  Marland Kitchen omeprazole (PRILOSEC) 20 MG capsule Take 20 mg by mouth 2 (two) times daily.    . ranolazine (RANEXA) 500 MG 12 hr tablet Take 1 tablet (500 mg total) by mouth 2 (two) times daily.  Marland Kitchen umeclidinium bromide (INCRUSE ELLIPTA) 62.5 MCG/INH AEPB Inhale 1 puff into the lungs daily.  Marland Kitchen zolpidem (AMBIEN CR) 12.5 MG CR tablet Take 12.5 mg by mouth at bedtime.     No facility-administered encounter medications on file as of 05/27/2016.      REVIEW OF SYSTEMS  : All other systems reviewed and negative except where noted in the History of Present Illness.   PHYSICAL EXAM: BP (!) 88/70   Pulse 92   Ht '5\' 6"'$  (1.676 m)   Wt 161 lb 6 oz (73.2 kg)   BMI 26.05 kg/m  General:  Chronically ill-appearing white female in no acute distress Head: Normocephalic and atraumatic Eyes:  Sclerae anicteric, conjunctiva pink. Ears: Normal auditory acuity Lungs: Clear throughout to auscultation.  Decreased air movement B/L. Heart: Regular rate and rhythm Abdomen: Soft, non-distended.  Normal bowel sounds.  Mild epigastric TTP. Musculoskeletal:  Symmetrical with no gross deformities  Skin: No lesions on visible extremities Extremities: No edema  Neurological: Alert oriented x 4, grossly non-focal Psychological:  Alert and cooperative. Normal mood and affect  ASSESSMENT AND PLAN: -Large intrathoracic hiatal hernia -Dysphagia:  Likely somewhat due to her hiatal hernia but also has a mild narrowing at the distal esophagus. -O2 dependent lung disease:  Large intrathoracic hiatal hernia certainly not helping her breathing. -Constipation:  Begin Miralax daily.  *Discussed with Dr. Havery Moros.  Will schedule for EGD with dilation at [facility] hospital to see if that helps her dysphagia.  Likely going to be a poor surgical candidate for hiatal hernia repair.   CC:  Bronson Curb, P*

## 2016-05-27 NOTE — Patient Instructions (Signed)

## 2016-05-27 NOTE — Progress Notes (Signed)
Agree with assessment and plan. We can offer her dilation and see if this helps her dysphagia. It appears she has a very large hiatal hernia, which could be at risk for torsion and perhaps impairing her lung function. She is not a good surgical candidate given her lung disease but can have surgery see her after EGD to assess her candidacy.

## 2016-05-29 ENCOUNTER — Ambulatory Visit: Payer: Medicare HMO | Admitting: Pulmonary Disease

## 2016-06-06 NOTE — Progress Notes (Signed)
New date/time instructions mailed.

## 2016-06-09 ENCOUNTER — Encounter: Payer: Self-pay | Admitting: Gastroenterology

## 2016-06-09 DIAGNOSIS — G245 Blepharospasm: Secondary | ICD-10-CM | POA: Diagnosis not present

## 2016-06-10 ENCOUNTER — Telehealth: Payer: Self-pay

## 2016-06-10 NOTE — Telephone Encounter (Signed)
Patient advised that we had a cancellation and have rescheduled her EGD from 08/01/16 to 07/04/16 at [facility] arrive at 10:15 for 11:45 procedure. Patient did get her instruction for prep on MyChart. She understands to call our office if she has questions or concerns.

## 2016-06-26 ENCOUNTER — Ambulatory Visit: Payer: Medicare HMO | Admitting: Pulmonary Disease

## 2016-06-26 ENCOUNTER — Encounter (HOSPITAL_COMMUNITY): Payer: Self-pay

## 2016-06-27 ENCOUNTER — Encounter (HOSPITAL_COMMUNITY): Payer: Self-pay

## 2016-07-04 ENCOUNTER — Encounter (HOSPITAL_COMMUNITY)
Admission: RE | Disposition: A | Discharge status: Home / Self Care | Payer: Self-pay | Source: Ambulatory Visit | Attending: Gastroenterology

## 2016-07-04 ENCOUNTER — Ambulatory Visit (HOSPITAL_COMMUNITY): Payer: Medicare Other | Admitting: Anesthesiology

## 2016-07-04 ENCOUNTER — Encounter (HOSPITAL_COMMUNITY): Payer: Self-pay | Admitting: *Deleted

## 2016-07-04 ENCOUNTER — Ambulatory Visit (HOSPITAL_COMMUNITY)
Admission: RE | Admit: 2016-07-04 | Discharge: 2016-07-04 | Disposition: A | Discharge status: Home / Self Care | Payer: Medicare Other | Source: Ambulatory Visit | Attending: Gastroenterology | Admitting: Gastroenterology

## 2016-07-04 DIAGNOSIS — Z880 Allergy status to penicillin: Secondary | ICD-10-CM | POA: Insufficient documentation

## 2016-07-04 DIAGNOSIS — J449 Chronic obstructive pulmonary disease, unspecified: Secondary | ICD-10-CM | POA: Diagnosis not present

## 2016-07-04 DIAGNOSIS — F329 Major depressive disorder, single episode, unspecified: Secondary | ICD-10-CM | POA: Diagnosis not present

## 2016-07-04 DIAGNOSIS — Z6829 Body mass index (BMI) 29.0-29.9, adult: Secondary | ICD-10-CM | POA: Insufficient documentation

## 2016-07-04 DIAGNOSIS — F17210 Nicotine dependence, cigarettes, uncomplicated: Secondary | ICD-10-CM | POA: Insufficient documentation

## 2016-07-04 DIAGNOSIS — K219 Gastro-esophageal reflux disease without esophagitis: Secondary | ICD-10-CM | POA: Diagnosis not present

## 2016-07-04 DIAGNOSIS — I272 Pulmonary hypertension, unspecified: Secondary | ICD-10-CM | POA: Insufficient documentation

## 2016-07-04 DIAGNOSIS — H9190 Unspecified hearing loss, unspecified ear: Secondary | ICD-10-CM | POA: Diagnosis not present

## 2016-07-04 DIAGNOSIS — E785 Hyperlipidemia, unspecified: Secondary | ICD-10-CM | POA: Diagnosis not present

## 2016-07-04 DIAGNOSIS — Z853 Personal history of malignant neoplasm of breast: Secondary | ICD-10-CM | POA: Insufficient documentation

## 2016-07-04 DIAGNOSIS — K3189 Other diseases of stomach and duodenum: Secondary | ICD-10-CM | POA: Diagnosis not present

## 2016-07-04 DIAGNOSIS — Z888 Allergy status to other drugs, medicaments and biological substances status: Secondary | ICD-10-CM | POA: Insufficient documentation

## 2016-07-04 DIAGNOSIS — Z9012 Acquired absence of left breast and nipple: Secondary | ICD-10-CM | POA: Diagnosis not present

## 2016-07-04 DIAGNOSIS — K2970 Gastritis, unspecified, without bleeding: Secondary | ICD-10-CM | POA: Diagnosis not present

## 2016-07-04 DIAGNOSIS — I10 Essential (primary) hypertension: Secondary | ICD-10-CM | POA: Diagnosis not present

## 2016-07-04 DIAGNOSIS — R1310 Dysphagia, unspecified: Secondary | ICD-10-CM | POA: Diagnosis not present

## 2016-07-04 DIAGNOSIS — Z886 Allergy status to analgesic agent status: Secondary | ICD-10-CM | POA: Insufficient documentation

## 2016-07-04 DIAGNOSIS — Z885 Allergy status to narcotic agent status: Secondary | ICD-10-CM | POA: Diagnosis not present

## 2016-07-04 DIAGNOSIS — E669 Obesity, unspecified: Secondary | ICD-10-CM | POA: Insufficient documentation

## 2016-07-04 DIAGNOSIS — K2950 Unspecified chronic gastritis without bleeding: Secondary | ICD-10-CM | POA: Diagnosis not present

## 2016-07-04 DIAGNOSIS — K222 Esophageal obstruction: Secondary | ICD-10-CM | POA: Diagnosis not present

## 2016-07-04 DIAGNOSIS — K449 Diaphragmatic hernia without obstruction or gangrene: Secondary | ICD-10-CM | POA: Diagnosis not present

## 2016-07-04 DIAGNOSIS — M1990 Unspecified osteoarthritis, unspecified site: Secondary | ICD-10-CM | POA: Diagnosis not present

## 2016-07-04 DIAGNOSIS — K2990 Gastroduodenitis, unspecified, without bleeding: Secondary | ICD-10-CM

## 2016-07-04 DIAGNOSIS — Z9221 Personal history of antineoplastic chemotherapy: Secondary | ICD-10-CM | POA: Diagnosis not present

## 2016-07-04 DIAGNOSIS — F419 Anxiety disorder, unspecified: Secondary | ICD-10-CM | POA: Diagnosis not present

## 2016-07-04 HISTORY — PX: ESOPHAGOGASTRODUODENOSCOPY: SHX5428

## 2016-07-04 SURGERY — EGD (ESOPHAGOGASTRODUODENOSCOPY)
Anesthesia: Monitor Anesthesia Care

## 2016-07-04 MED ORDER — PROPOFOL 10 MG/ML IV BOLUS
INTRAVENOUS | Status: AC
  Filled 2016-07-04: qty 20

## 2016-07-04 MED ORDER — PROPOFOL 10 MG/ML IV BOLUS
INTRAVENOUS | Status: DC | PRN
  Administered 2016-07-04 (×2): 20 mg via INTRAVENOUS
  Administered 2016-07-04: 30 mg via INTRAVENOUS
  Administered 2016-07-04: 20 mg via INTRAVENOUS
  Administered 2016-07-04: 30 mg via INTRAVENOUS
  Administered 2016-07-04: 20 mg via INTRAVENOUS
  Administered 2016-07-04 (×2): 30 mg via INTRAVENOUS

## 2016-07-04 MED ORDER — LIDOCAINE 2% (20 MG/ML) 5 ML SYRINGE
INTRAMUSCULAR | Status: AC
  Filled 2016-07-04: qty 5

## 2016-07-04 MED ORDER — OMEPRAZOLE 40 MG PO CPDR: 40.0000 mg | DELAYED_RELEASE_CAPSULE | Freq: Every day | ORAL | 1 refills | Status: DC

## 2016-07-04 MED ORDER — LIDOCAINE HCL (CARDIAC) 20 MG/ML IV SOLN
INTRAVENOUS | Status: DC | PRN
  Administered 2016-07-04: 100 mg via INTRAVENOUS

## 2016-07-04 MED ORDER — PROPOFOL 500 MG/50ML IV EMUL
INTRAVENOUS | Status: DC | PRN
  Administered 2016-07-04: 100 ug/kg/min via INTRAVENOUS

## 2016-07-04 MED ORDER — LACTATED RINGERS IV SOLN
INTRAVENOUS | Status: DC
  Administered 2016-07-04: 10:00:00 via INTRAVENOUS

## 2016-07-04 NOTE — Anesthesia Preprocedure Evaluation (Addendum)
Anesthesia Evaluation  Patient identified by MRN, date of birth, ID band Patient awake    Reviewed: Allergy & Precautions, NPO status , Patient's Chart, lab work & pertinent test results  Airway Mallampati: II  TM Distance: >3 FB     Dental  (+) Poor Dentition, Missing, Chipped, Dental Advisory Given   Pulmonary shortness of breath and with exertion, COPD (RA O2 sat=88%),  COPD inhaler, Current Smoker,    breath sounds clear to auscultation + decreased breath sounds      Cardiovascular hypertension, Pt. on medications + DOE   Rhythm:Regular Rate:Normal     Neuro/Psych PSYCHIATRIC DISORDERS Anxiety Depression    GI/Hepatic GERD  Medicated and Controlled,  Endo/Other  negative endocrine ROS  Renal/GU Renal disease     Musculoskeletal   Abdominal   Peds  Hematology   Anesthesia Other Findings   Reproductive/Obstetrics                            Anesthesia Physical  Anesthesia Plan  ASA: III  Anesthesia Plan: MAC   Post-op Pain Management:    Induction:   Airway Management Planned: Natural Airway and Simple Face Mask  Additional Equipment:   Intra-op Plan:   Post-operative Plan:   Informed Consent: I have reviewed the patients History and Physical, chart, labs and discussed the procedure including the risks, benefits and alternatives for the proposed anesthesia with the patient or authorized representative who has indicated his/her understanding and acceptance.   Dental advisory given  Plan Discussed with: CRNA and Anesthesiologist  Anesthesia Plan Comments:        Anesthesia Quick Evaluation

## 2016-07-04 NOTE — Interval H&P Note (Signed)
History and Physical Interval Note:  07/04/2016 10:20 AM  Kathleen Burns  has presented today for surgery, with the diagnosis of dysphagia; esophageal stricture; hiatal hernia  The various methods of treatment have been discussed with the patient and family. After consideration of risks, benefits and other options for treatment, the patient has consented to  Procedure(s): ESOPHAGOGASTRODUODENOSCOPY (EGD) (N/A) as a surgical intervention .  The patient's history has been reviewed, patient examined, no change in status, stable for surgery.  I have reviewed the patient's chart and labs.  Questions were answered to the patient's satisfaction.     Kathleen Burns

## 2016-07-04 NOTE — Op Note (Signed)
[facility] Patient Name: Kathleen Burns Procedure Date: 07/04/2016 MRN: [id number] Attending MD: Carlota Raspberry. Armbruster MD, MD Date of Birth: October 17, 1955 CSN: [id number] Age: 61 Admit Type: Outpatient Procedure:                Upper GI endoscopy Indications:              Dysphagia, mild stenosis at GEJ noted on barium                            study, very large hiatal hernia noted as well Providers:                Carlota Raspberry. Armbruster MD, MD, Cleda Daub, RN,                            Corliss Parish, Technician Referring MD:              Medicines:                Monitored Anesthesia Care Complications:            No immediate complications. Estimated blood loss:                            Minimal. Estimated Blood Loss:     Estimated blood loss was minimal. Procedure:                Pre-Anesthesia Assessment:                           - Prior to the procedure, a History and Physical                            was performed, and patient medications and                            allergies were reviewed. The patient's tolerance of                            previous anesthesia was also reviewed. The risks                            and benefits of the procedure and the sedation                            options and risks were discussed with the patient.                            All questions were answered, and informed consent                            was obtained. Prior Anticoagulants: The patient has                            taken no previous anticoagulant or antiplatelet  agents. ASA Grade Assessment: III - A patient with                            severe systemic disease. After reviewing the risks                            and benefits, the patient was deemed in                            satisfactory condition to undergo the procedure.                           After obtaining informed consent, the endoscope was           passed under direct vision. Throughout the                            procedure, the patient's blood pressure, pulse, and                            oxygen saturations were monitored continuously. The                            [id number]) scope was introduced through the                            mouth, and advanced to the second part of duodenum.                            The upper GI endoscopy was accomplished without                            difficulty. The patient tolerated the procedure                            well. Scope In: Scope Out: Findings:      Esophagogastric landmarks were identified: the Z-line was found at 36       cm, the gastroesophageal junction was found at 36 cm and the upper       extent of the gastric folds was found at 36 cm from the incisors.      One mild benign-appearing, intrinsic stenosis was found 36 cm from the       incisors. It was widely patent and was traversed. A TTS dilator was       passed through the scope. Dilation with an 18-19-20 mm balloon dilator       was performed to 20 mm without any mucosal wrent.      The exam of the esophagus was otherwise normal.      A massive paraesophageal hiatal hernia was present, in which most of the       stomach was contained within the hernia sac within the chest. The       pylorus appeared to be within the hernia sac.      Diffuse moderately erythematous mucosa was found in the entire examined       stomach without focal ulceration.  Biopsies were taken with a cold       forceps for Helicobacter pylori testing.      The duodenal bulb and second portion of the duodenum were normal. Impression:               - Esophagogastric landmarks identified.                           - Benign-appearing esophageal stenosis. Dilated to                            47m without mucosal wrent noted.                           - Massive paraesophageal hiatal hernia, containing                            the  majority of the stomach.                           - Erythematous mucosa in the stomach. Biopsied.                           - Normal duodenal bulb and second portion of the                            duodenum.                           Overall, while mild stenosis of the esophagus noted                            and dilated, I suspect the majority of her symptoms                            are due to massive paraesophageal hernia, which may                            be compromising lung function as well. Moderate Sedation:      No moderate sedation, case performed with MAC Recommendation:           - Patient has a contact number available for                            emergencies. The signs and symptoms of potential                            delayed complications were discussed with the                            patient. Return to normal activities tomorrow.                            Written discharge instructions were provided to the  patient.                           - Resume previous diet.                           - Continue present medications.                           - Start omeprazole 33m daily for gastritis                           - Await pathology results.                           - While patient is higher than average surgical                            risk, I would recommend a general surgery                            consultation to discuss potential surgical repair                            of hernia, for which she is at risk of                            complications of hernia and it may be impairing                            lung function. Procedure Code(s):        --- Professional ---                           [id number] Esophagogastroduodenoscopy, flexible,                            transoral; with transendoscopic balloon dilation of                            esophagus (less than 30 mm diameter)                           43239,  Esophagogastroduodenoscopy, flexible,                            transoral; with biopsy, single or multiple Diagnosis Code(s):        --- Professional ---                           K22.2, Esophageal obstruction                           K44.9, Diaphragmatic hernia without obstruction or                            gangrene  K31.89, Other diseases of stomach and duodenum                           R13.10, Dysphagia, unspecified CPT copyright 2016 American Medical Association. All rights reserved. The codes documented in this report are preliminary and upon coder review may  be revised to meet current compliance requirements. Remo Lipps P. Armbruster MD, MD 07/04/2016 12:13:06 PM This report has been signed electronically. Number of Addenda: 0

## 2016-07-04 NOTE — H&P (Signed)
HPI :  61 y/o female with history of multiple medical problems including COPD and breast cancer, presenting with dysphagia. Large paraesophageal hernia noted on barium study with mild smooth narrowing of the distal esophagus. Here for EGD to further evaluate with possible dilation.   Past Medical History:  Diagnosis Date  . Anxiety and depression    H/o suicide attempt  . Breast cancer ([place])    Left mastectomy in 1990s + chemotherapy  . Chest pain    Cardiac catheterization in 1999, 2003, 2008-normal EF, normal coronaries  . COPD (chronic obstructive pulmonary disease) ([place])   . GERD (gastroesophageal reflux disease)   . Hearing impairment   . Hiatal hernia    large  . Hyperlipidemia   . Hypertension   . Meige disease (lymphedema praecox)    blepharospasm; followed at [facility] And treated with botulinum toxin injections  . Obesity   . Osteoarthritis   . Pulmonary hypertension   . Skull fracture [facility]) 1969   surgical repair in 1969  . Tobacco abuse    45 pack years     Past Surgical History:  Procedure Laterality Date  . [place] and  . CARPAL TUNNEL RELEASE  1996  . CHOLECYSTECTOMY  1996  . COLONOSCOPY  2011   No significant abnormalities  . [place], 2001  . INCISIONAL HERNIA REPAIR N/A 10/27/2014   Procedure: Fatima Blank HERNIORRHAPHY WITH MESH;  Surgeon: Aviva Signs Md, MD;  Location: [facility];  Service: General;  Laterality: N/A;  . INSERTION OF MESH N/A 10/27/2014   Procedure: INSERTION OF MESH;  Surgeon: Aviva Signs Md, MD;  Location: [facility];  Service: General;  Laterality: N/A;  . KNEE SURGERY  1999   Left  . MASTECTOMY  1990   Left  . SHOULDER SURGERY  1996   Left  . SKULL FRACTURE ELEVATION  1969  . TONSILLECTOMY    . TOTAL ABDOMINAL HYSTERECTOMY W/ BILATERAL SALPINGOOPHORECTOMY  1994   Family History  Problem Relation Age of Onset  . Coronary artery disease  72    + mother, sister  . Hypertension      + father, Sister  . Diabetes        + Sister   Social History  Substance Use Topics  . Smoking status: Current Every Day Smoker    Packs/day: 1.50    Years: 30.00    Types: Cigarettes    Start date: 03/17/1974  . Smokeless tobacco: Never Used     Comment: Encouraged to quit smoking. Counseled on importance of smoking cessation.  . Alcohol use No   Current Facility-Administered Medications  Medication Dose Route Frequency Provider Last Rate Last Dose  . lactated ringers infusion   Intravenous Continuous Manus Gunning, MD       Allergies  Allergen Reactions  . Artane [Trihexyphenidyl] Other (See Comments)    tachycardia  . Aspirin Other (See Comments)    + nonsteroidals-tachycardia  . Cogentin [Benztropine Mesylate] Other (See Comments)    Tachycardia   . Demerol Other (See Comments)    Delirium  . Inderal [Propranolol Hcl] Other (See Comments)    Psychosis  . Levofloxacin Other (See Comments)    Muscle weakness  . Meperidine Other (See Comments)    ANXIETY   . Paxil [Paroxetine Hcl] Other (See Comments)    Anxiety   . Procardia [Nifedipine] Other (See Comments)    HEADACHE  . Proparacaine Other (See Comments)    UNKNOWN  .  Benztropine Palpitations and Other (See Comments)    TA  . Penicillins Rash    Has patient had a PCN reaction causing immediate rash, facial/tongue/throat swelling, SOB or lightheadedness with hypotension: Yes Has patient had a PCN reaction causing severe rash involving mucus membranes or skin necrosis:UNKNOWN Has patient had a PCN reaction that required hospitalization No Has patient had a PCN reaction occurring within the last 10 years: No If all of the above answers are "NO", then may proceed with Cephalosporin use.      Review of Systems: All systems reviewed and negative except where noted in HPI.    No results found.  Physical Exam: BP 106/65   Pulse (!) 102   Temp 98.3 F (36.8 C) (Oral)   Resp 14   Ht '5\' 6"'$  (1.676 m)   Wt 180 lb (81.6 kg)   SpO2  91%   BMI 29.05 kg/m  Constitutional: Pleasant,well-developed, female in no acute distress. Cardiovascular: Normal rate, regular rhythm.  Pulmonary/chest:  Decreased BS B Abdominal: Soft, nondistended, nontender.    ASSESSMENT AND PLAN: 60 y/o female with history of COPD and other medical problems as outlined above, with large hiatal hernia and possible esophageal stricture, here for EGD to further evaluate. I discussed risks / benefits of EGD with dilation with her and she wished to proceed.   [place] Cellar, MD [facility] Pager [phone]

## 2016-07-04 NOTE — Transfer of Care (Signed)
Immediate Anesthesia Transfer of Care Note  Patient: Kathleen Burns  Procedure(s) Performed: Procedure(s): ESOPHAGOGASTRODUODENOSCOPY (EGD) (N/A)  Patient Location: PACU  Anesthesia Type:MAC  Level of Consciousness: Patient easily awoken, sedated, comfortable, cooperative, following commands, responds to stimulation.   Airway & Oxygen Therapy: Patient spontaneously breathing, ventilating well, oxygen via simple oxygen mask.  Post-op Assessment: Report given to PACU RN, vital signs reviewed and stable, moving all extremities.   Post vital signs: Reviewed and stable.  Complications: No apparent anesthesia complications Last Vitals:  Vitals:   07/04/16 1010 07/04/16 1207  BP: 106/65 102/61  Pulse: (!) 102 95  Resp: 14 16  Temp: 36.8 C     Last Pain:  Vitals:   07/04/16 1010  TempSrc: Oral         Complications: No apparent anesthesia complications

## 2016-07-04 NOTE — Anesthesia Postprocedure Evaluation (Addendum)
Anesthesia Post Note  Patient: Kathleen Burns  Procedure(s) Performed: Procedure(s) (LRB): ESOPHAGOGASTRODUODENOSCOPY (EGD) (N/A)  Patient location during evaluation: Endoscopy Anesthesia Type: MAC Level of consciousness: awake and alert Pain management: pain level controlled Vital Signs Assessment: post-procedure vital signs reviewed and stable Respiratory status: spontaneous breathing and respiratory function stable Cardiovascular status: stable Anesthetic complications: no       Last Vitals:  Vitals:   07/04/16 1010 07/04/16 1207  BP: 106/65 102/61  Pulse: (!) 102 95  Resp: 14 16  Temp: 36.8 C     Last Pain:  Vitals:   07/04/16 1010  TempSrc: Oral                 Dalyla Chui DANIEL

## 2016-07-04 NOTE — Discharge Instructions (Signed)
YOU HAD AN ENDOSCOPIC PROCEDURE TODAY: Refer to the procedure report and other information in the discharge instructions given to you for any specific questions about what was found during the examination. If this information does not answer your questions, please call [place] office at [phone] to clarify.  ° °YOU SHOULD EXPECT: Some feelings of bloating in the abdomen. Passage of more gas than usual. Walking can help get rid of the air that was put into your GI tract during the procedure and reduce the bloating. If you had a lower endoscopy (such as a colonoscopy or flexible sigmoidoscopy) you may notice spotting of blood in your stool or on the toilet paper. Some abdominal soreness may be present for a day or two, also. ° °DIET: Your first meal following the procedure should be a light meal and then it is ok to progress to your normal diet. A half-sandwich or bowl of soup is an example of a good first meal. Heavy or fried foods are harder to digest and may make you feel nauseous or bloated. Drink plenty of fluids but you should avoid alcoholic beverages for 24 hours. If you had a esophageal dilation, please see attached instructions for diet.   ° °ACTIVITY: Your care partner should take you home directly after the procedure. You should plan to take it easy, moving slowly for the rest of the day. You can resume normal activity the day after the procedure however YOU SHOULD NOT DRIVE, use power tools, machinery or perform tasks that involve climbing or major physical exertion for 24 hours (because of the sedation medicines used during the test).  ° °SYMPTOMS TO REPORT IMMEDIATELY: °A gastroenterologist can be reached at any hour. Please call [phone]  for any of the following symptoms:  °Following lower endoscopy (colonoscopy, flexible sigmoidoscopy) °Excessive amounts of blood in the stool  °Significant tenderness, worsening of abdominal pains  °Swelling of the abdomen that is new, acute  °Fever of 100° or  higher  °Following upper endoscopy (EGD, EUS, ERCP, esophageal dilation) °Vomiting of blood or coffee ground material  °New, significant abdominal pain  °New, significant chest pain or pain under the shoulder blades  °Painful or persistently difficult swallowing  °New shortness of breath  °Black, tarry-looking or red, bloody stools ° °FOLLOW UP:  °If any biopsies were taken you will be contacted by phone or by letter within the next 1-3 weeks. Call [phone]  if you have not heard about the biopsies in 3 weeks.  °Please also call with any specific questions about appointments or follow up tests. ° °

## 2016-07-07 ENCOUNTER — Encounter (HOSPITAL_COMMUNITY): Payer: Self-pay | Admitting: Gastroenterology

## 2016-07-08 ENCOUNTER — Telehealth: Payer: Self-pay | Admitting: Cardiovascular Disease

## 2016-07-08 NOTE — Telephone Encounter (Signed)
Pt called stating her BP is running to low--top # has been in the 90's

## 2016-07-08 NOTE — Telephone Encounter (Signed)
Hold amlodipine. Ranexa does not affect blood pressure.

## 2016-07-08 NOTE — Telephone Encounter (Signed)
On Ranexa 500 MG bid pt states BP is 17'H systolic 15'A diastolic, feels dizzy when walking ,sx's over past month, no changes in meds

## 2016-07-09 ENCOUNTER — Encounter: Payer: Self-pay | Admitting: *Deleted

## 2016-07-09 NOTE — Telephone Encounter (Signed)
Responded to pt via MyChart at pt request. Pt to reply.

## 2016-07-11 ENCOUNTER — Telehealth: Payer: Self-pay | Admitting: Pulmonary Disease

## 2016-07-11 ENCOUNTER — Other Ambulatory Visit: Payer: Self-pay

## 2016-07-11 MED ORDER — AMBRISENTAN 5 MG PO TABS: 5.0000 mg | ORAL_TABLET | Freq: Every day | ORAL | 3 refills | Status: DC

## 2016-07-11 NOTE — Telephone Encounter (Signed)
Spoke with a Rep. At  [facility] and they stated the manufacturer [employer] sent over the order for them to fill this pt. [place]. They stated since this pt. Has never used their pharmacy before they needed a new rx sent in. The rx was sent in. Nothing further is needed at this time.

## 2016-07-15 ENCOUNTER — Telehealth: Payer: Self-pay | Admitting: Pulmonary Disease

## 2016-07-15 NOTE — Telephone Encounter (Signed)
Checked with Caryl Pina, she does not have any paperwork or PA's for this pt currently. Attempted to contact [place]. Was placed on a long hold. Will try back.

## 2016-07-18 NOTE — Telephone Encounter (Signed)
Spoke with April at [facility] advised that we do not have any forms on this pt and asked that she refax them  She states she will refax today to the up front fax  Will hold in triage to await this fax

## 2016-07-21 ENCOUNTER — Encounter: Payer: Self-pay | Admitting: Pulmonary Disease

## 2016-07-21 NOTE — Telephone Encounter (Signed)
PA received and initiated through [employer]- this has been approved through 01/18/2017.  Reference #: [id number]. Called [facility], aware of rx being approved.   Pt notified through email.  Nothing further needed.

## 2016-07-24 ENCOUNTER — Telehealth: Payer: Self-pay

## 2016-07-24 NOTE — Telephone Encounter (Signed)
Pt has an appt with [facility] 07/24/16 at 11:15. She was informed of the appt by their office.

## 2016-07-25 ENCOUNTER — Other Ambulatory Visit (INDEPENDENT_AMBULATORY_CARE_PROVIDER_SITE_OTHER): Payer: Medicare Other

## 2016-07-25 ENCOUNTER — Ambulatory Visit (INDEPENDENT_AMBULATORY_CARE_PROVIDER_SITE_OTHER)
Admission: RE | Admit: 2016-07-25 | Discharge: 2016-07-25 | Disposition: A | Discharge status: Home / Self Care | Payer: 59 | Source: Ambulatory Visit | Attending: Pulmonary Disease | Admitting: Pulmonary Disease

## 2016-07-25 ENCOUNTER — Encounter: Payer: Self-pay | Admitting: Pulmonary Disease

## 2016-07-25 ENCOUNTER — Ambulatory Visit (INDEPENDENT_AMBULATORY_CARE_PROVIDER_SITE_OTHER): Payer: Medicare Other | Admitting: Pulmonary Disease

## 2016-07-25 VITALS — BP 132/66 | HR 84 | Ht 66.0 in | Wt 161.0 lb

## 2016-07-25 DIAGNOSIS — R0602 Shortness of breath: Secondary | ICD-10-CM

## 2016-07-25 DIAGNOSIS — I272 Pulmonary hypertension, unspecified: Secondary | ICD-10-CM | POA: Diagnosis not present

## 2016-07-25 DIAGNOSIS — R944 Abnormal results of kidney function studies: Secondary | ICD-10-CM

## 2016-07-25 LAB — COMPREHENSIVE METABOLIC PANEL
ALBUMIN: 4 g/dL (ref 3.5–5.2)
ALK PHOS: 65 U/L (ref 39–117)
ALT: 6 U/L (ref 0–35)
AST: 10 U/L (ref 0–37)
BILIRUBIN TOTAL: 0.4 mg/dL (ref 0.2–1.2)
BUN: 19 mg/dL (ref 6–23)
CO2: 28 mEq/L (ref 19–32)
Calcium: 9.8 mg/dL (ref 8.4–10.5)
Chloride: 106 mEq/L (ref 96–112)
Creatinine, Ser: 0.99 mg/dL (ref 0.40–1.20)
GFR: 60.37 mL/min (ref >60.00)
GLUCOSE: 105 mg/dL — AB (ref 70–99)
POTASSIUM: 4.6 meq/L (ref 3.5–5.1)
SODIUM: 137 meq/L (ref 135–145)
TOTAL PROTEIN: 7.4 g/dL (ref 6.0–8.3)

## 2016-07-25 LAB — CBC WITH DIFFERENTIAL/PLATELET
Basophils Absolute: 0 10*3/uL (ref 0.0–0.1)
Basophils Relative: 0.4 % (ref 0.0–3.0)
EOS PCT: 0 % (ref 0.0–5.0)
Eosinophils Absolute: 0 10*3/uL (ref 0.0–0.7)
HCT: 44.8 % (ref 36.0–46.0)
HEMOGLOBIN: 15 g/dL (ref 12.0–15.0)
Lymphocytes Relative: 25.9 % (ref 12.0–46.0)
Lymphs Abs: 3.1 10*3/uL (ref 0.7–4.0)
MCHC: 33.5 g/dL (ref 30.0–36.0)
MCV: 100 fl (ref 78.0–100.0)
MONOS PCT: 7.3 % (ref 3.0–12.0)
Monocytes Absolute: 0.9 10*3/uL (ref 0.1–1.0)
NEUTROS PCT: 66.4 % (ref 43.0–77.0)
Neutro Abs: 7.9 10*3/uL — ABNORMAL HIGH (ref 1.4–7.7)
Platelets: 320 10*3/uL (ref 150.0–400.0)
RBC: 4.48 Mil/uL (ref 3.87–5.11)
RDW: 13.7 % (ref 11.5–15.5)
WBC: 11.9 10*3/uL — AB (ref 4.0–10.5)

## 2016-07-25 NOTE — Progress Notes (Signed)
Subjective:    Patient ID: Kathleen Burns, female    DOB: 12-13-1955, 61 y.o.   MRN: [id number]  Synopsis: Former patient of Dr. Gwenette Burns with COPD and pulmonary hypertension Echo 04/2012:  Normal EF, LVH with diastolic dysfunction and elevated LV end-diastolic pressure, mildly dilated LA, mild decrease in RV function, PA peak pressure 24m. RPioneer2014:  Mean PA 35, PCWP 15, Fick CO 4.2, PVR 4-5 Felt to be due to her copd, as well as diastolic dysfxn. ONO RA 2014: desat to 80% >> started on nocturnal oxygen.  PFT's 08/2012:  FEV1 2.11 (75%), ratio 66, +airtrapping, no restriction, DLCO 38%. ONO 2014:  desat to 80% >> started on nocturnal oxygen >> turned back in.  Did not want. September 2016 pulmonary function testing ratio 67%, FEV1 1.86 L (67% predicted), total lung capacity 6.03 L (112% predicted), DLCO 7.97 (29% predicted), residual volume 3.34 L (159% predicted). Started Kathleen Burns March 2017.   Started exertional oxygen again in summer 2017 01/2016 6MW 1490mO2 saturation 90% on 3L Kathleen Burns  HPI Chief Complaint  Patient presents with  . Follow-up    pt doing well, c/o sob with exertion.  pt has pending abdominal sx.    Kathleen Burns has been referred to a thoracic surgeon for evaluation of treatment for her hiatal hernia. That visit is planned   She is seeing a Kathleen Burns this month as well as Kathleen Burns She says that she has been doing OK.  She has difficulty breathing at night typically, will have her head propped up. She will have more dyspnea with minimal exertion in the evenings only.  She has more chest congestion at the time.  She has been coughing more.  She has continued to smoke cigarettes 1.5 ppd.    She has some ankle swelling which is worse.  She notes that she feels better when she takes the diuretic medicine.  She has gained more weight lately, and her weight is currently down about 20-30 pounds in the last year.   She wonders if the Kathleen Burns  associated with this.  She is eating less due to the abdominal pain.     Past Medical History:  Diagnosis Date  . Anxiety and depression    H/o suicide attempt  . Breast cancer ([facility]   Left mastectomy in 1990s + chemotherapy  . Chest pain    Cardiac catheterization in 1999, 2003, 2008-normal EF, normal coronaries  . COPD (chronic obstructive pulmonary disease) ([facility]  . GERD (gastroesophageal reflux disease)   . Hearing impairment   . Hiatal hernia    large  . Hyperlipidemia   . Hypertension   . Meige disease (lymphedema praecox)    [facility]; [facility]  . Obesity   . Osteoarthritis   . Pulmonary hypertension   . [facility]   surgical repair in [facility]  . Tobacco abuse    45 pack years      Review of Systems  Constitutional: Positive for fatigue. Negative for chills and fever.  HENT: Negative for postnasal drip, rhinorrhea and sinus pressure.   Respiratory: Positive for cough and shortness of breath. Negative for wheezing.   Cardiovascular: Positive for chest pain. Negative for palpitations and leg swelling.       Objective:   Physical Exam Vitals:   07/25/16 1601  BP: 132/66  Pulse: 84  SpO2: 91%  Weight: 161 lb (73 kg)  Height: '5\' 6"'$  (1.676 m)   RA  Gen: chronically ill appearing HENT: OP clear, TM's clear, neck supple PULM: Few crackles bases B, normal percussion CV: RRR, no mgr, trace edema GI: BS+, soft, nontender Derm: no cyanosis or rash Psyche: normal mood and affect   Records from her recent endoscopy reviewed were she had gastritis seen on gastric biopsy as well as some intestinal metaplasia.  Surgical pathology results reviewed as above.  BMET    Component Value Date/Time   NA 135 10/25/2014 1500   K 4.6 10/25/2014 1500   CL 102 10/25/2014 1500   CO2 21 10/25/2014 1500   GLUCOSE 150 (H) 10/25/2014 1500   BUN 28 (H) 10/25/2014 1500   CREATININE 1.13 (H) 10/25/2014  1500   CREATININE 1.09 04/02/2012 1636   CALCIUM 9.7 10/25/2014 1500   GFRNONAA 52 (L) 10/25/2014 1500   GFRAA 60 (L) 10/25/2014 1500        Assessment & Plan:  Impression: Tobacco abuse Centrilobular emphysema Pulmonary hypertension Large hiatal hernia Shortness of breath  Discussion: Kathleen Burns has lost a bit of weight in the last year which may be related to her Kathleen Burns, but I think is more likely related to the abdominal pain she experiences from her large hiatal hernia. In addition to that she's had some progression of shortness of breath. It's difficult to know what her dry weight should be right now in the setting of weight loss but she does appear slightly volume overloaded to me on exam today with some edema in her legs. Certainly her shortness of breath is also related to chronic bronchitis symptoms from ongoing tobacco abuse. She was counseled at length today to continue attempts to quit smoking. She has been intolerant of medical therapy for this in the past including Chantix.    For your shortness of breath: We will check a chest x-ray We will check a lung function test  For your COPD: Keep taking Brio and Incruise  For your tobacco abuse: Please stop smoking cigarettes immediately  For your pulmonary hypertension: We will check an echocardiogram Take Lasix every day for the next week Measure your weight every day We will check kidney and liver function tests today to make sure the Kathleen Burns is not causing any problems Keep taking Kathleen Burns We will check a 6 minute walk  We will see you back in about 6 weeks to go over all these results   Current Outpatient Prescriptions:  .  albuterol (PROAIR HFA) 108 (90 BASE) MCG/ACT inhaler, Inhale 2 puffs into the lungs 4 (four) times daily as needed. For shortness of breath , Disp: , Rfl:  .  ambrisentan (Kathleen Burns) 5 MG tablet, Take 1 tablet (5 mg total) by mouth daily., Disp: 90 tablet, Rfl: 3 .  cetirizine (ZYRTEC) 10 MG  tablet, Take 10 mg by mouth as needed for allergies. , Disp: , Rfl:  .  diphenhydramine-acetaminophen (TYLENOL PM) 25-500 MG TABS tablet, Take 2 tablets by mouth at bedtime as needed (SLEEP)., Disp: , Rfl:  .  fluticasone furoate-vilanterol (BREO ELLIPTA) 100-25 MCG/INH AEPB, Inhale 1 puff into the lungs daily., Disp: 90 each, Rfl: 3 .  furosemide (LASIX) 40 MG tablet, Take 1 tablet (40 mg total) by mouth daily as needed (if your weight is over 189 lbs). (Patient taking differently: Take 40 mg by mouth daily as needed for fluid (if your weight is over 189 lbs). ), Disp: 30 tablet, Rfl: 5 .  LORazepam (ATIVAN) 1 MG tablet, Take 1  mg by mouth every 8 (eight) hours.  , Disp: , Rfl:  .  metoprolol tartrate (LOPRESSOR) 25 MG tablet, Take 0.5 tablets (12.5 mg total) by mouth 2 (two) times daily., Disp: 90 tablet, Rfl: 3 .  montelukast (SINGULAIR) 10 MG tablet, Take 10 mg by mouth at bedtime., Disp: , Rfl:  .  Morphine-Naltrexone (EMBEDA) 60-2.4 MG CPCR, Take 1 capsule by mouth 2 (two) times daily., Disp: , Rfl:  .  nitroGLYCERIN (NITROSTAT) 0.4 MG SL tablet, Place 1 tablet (0.4 mg total) under the tongue every 5 (five) minutes as needed for chest pain., Disp: 25 tablet, Rfl: 3 .  omeprazole (PRILOSEC) 40 MG capsule, Take 1 capsule (40 mg total) by mouth daily. (Patient taking differently: Take 40 mg by mouth 2 (two) times daily. ), Disp: 90 capsule, Rfl: 1 .  ranolazine (RANEXA) 500 MG 12 hr tablet, Take 1 tablet (500 mg total) by mouth 2 (two) times daily., Disp: 180 tablet, Rfl: 3 .  traZODone (DESYREL) 50 MG tablet, Take 50 mg by mouth at bedtime., Disp: , Rfl:  .  umeclidinium bromide (INCRUSE ELLIPTA) 62.5 MCG/INH AEPB, Inhale 1 puff into the lungs daily., Disp: 90 each, Rfl: 1

## 2016-07-25 NOTE — Patient Instructions (Signed)
For your shortness of breath: We will check a chest x-ray We will check a lung function test  For your COPD: Keep taking Brio and Incruise  For your tobacco abuse: Please stop smoking cigarettes immediately  For your pulmonary hypertension: We will check an echocardiogram Take Lasix every day for the next week Measure your weight every day We will check kidney and liver function tests today to make sure the Milda Smart is not causing any problems Keep taking Milda Smart We will check a 6 minute walk  We will see you back in about 6 weeks to go over all these results

## 2016-07-28 NOTE — Telephone Encounter (Signed)
BQ- please see below email.  Thanks!  Could you please pass along to Dr Lake Bells that the surgeons name that I have an appointment with on February 21st is Dr Romana Juniper the office is there at [address] [facility]. I could not remember her name today. Thank you, Laqueshia

## 2016-07-30 ENCOUNTER — Other Ambulatory Visit (HOSPITAL_COMMUNITY)
Admission: RE | Admit: 2016-07-30 | Discharge: 2016-07-30 | Disposition: A | Discharge status: Home / Self Care | Payer: 59 | Source: Ambulatory Visit | Attending: Pulmonary Disease | Admitting: Pulmonary Disease

## 2016-07-30 ENCOUNTER — Ambulatory Visit (HOSPITAL_COMMUNITY)
Admission: RE | Admit: 2016-07-30 | Discharge: 2016-07-30 | Disposition: A | Discharge status: Home / Self Care | Payer: Medicare Other | Source: Ambulatory Visit | Attending: Pulmonary Disease | Admitting: Pulmonary Disease

## 2016-07-30 DIAGNOSIS — I358 Other nonrheumatic aortic valve disorders: Secondary | ICD-10-CM | POA: Insufficient documentation

## 2016-07-30 DIAGNOSIS — I501 Left ventricular failure: Secondary | ICD-10-CM | POA: Insufficient documentation

## 2016-07-30 DIAGNOSIS — I348 Other nonrheumatic mitral valve disorders: Secondary | ICD-10-CM | POA: Diagnosis not present

## 2016-07-30 DIAGNOSIS — I272 Pulmonary hypertension, unspecified: Secondary | ICD-10-CM | POA: Diagnosis not present

## 2016-07-30 DIAGNOSIS — I371 Nonrheumatic pulmonary valve insufficiency: Secondary | ICD-10-CM | POA: Insufficient documentation

## 2016-07-30 LAB — ECHOCARDIOGRAM COMPLETE
AVLVOTPG: 6 mmHg
CHL CUP STROKE VOLUME: 34 mL
E/e' ratio: 10.53
EWDT: 271 ms
FS: 31 % (ref 28–44)
IV/PV OW: 1.79
LA ID, A-P, ES: 31 mm
LA diam end sys: 31 mm
LA diam index: 1.67 cm/m2
LA vol A4C: 39.3 ml
LA vol: 32.8 mL
LAVOLIN: 17.7 mL/m2
LDCA: 3.14 cm2
LV E/e' medial: 10.53
LV E/e'average: 10.53
LV PW d: 5.58 mm — AB (ref 0.6–1.1)
LV TDI E'LATERAL: 6.09
LV e' LATERAL: 6.09 cm/s
LVDIAVOL: 57 mL (ref 46–106)
LVDIAVOLIN: 31 mL/m2
LVOT SV: 61 mL
LVOT VTI: 19.5 cm
LVOT peak vel: 123 cm/s
LVOTD: 20 mm
LVSYSVOL: 23 mL (ref 14–42)
LVSYSVOLIN: 13 mL/m2
MV Dec: 271
MV pk A vel: 97.3 m/s
MV pk E vel: 64.1 m/s
RV LATERAL S' VELOCITY: 11.6 cm/s
RV TAPSE: 16.5 mm
RV sys press: 71 mmHg
Reg peak vel: 413 cm/s
Simpson's disk: 59
TDI e' medial: 3.7
TR max vel: 413 cm/s

## 2016-07-30 LAB — COMPREHENSIVE METABOLIC PANEL
ALK PHOS: 61 U/L (ref 38–126)
ALT: 9 U/L — ABNORMAL LOW (ref 14–54)
AST: 16 U/L (ref 15–41)
Albumin: 3.7 g/dL (ref 3.5–5.0)
Anion gap: 10 (ref 5–15)
BUN: 35 mg/dL — AB (ref 6–20)
CALCIUM: 9.5 mg/dL (ref 8.9–10.3)
CO2: 26 mmol/L (ref 22–32)
CREATININE: 1.19 mg/dL — AB (ref 0.44–1.00)
Chloride: 99 mmol/L — ABNORMAL LOW (ref 101–111)
GFR calc Af Amer: 56 mL/min — ABNORMAL LOW (ref >60)
GFR, EST NON AFRICAN AMERICAN: 48 mL/min — AB (ref >60)
Glucose, Bld: 122 mg/dL — ABNORMAL HIGH (ref 65–99)
Potassium: 4.4 mmol/L (ref 3.5–5.1)
Sodium: 135 mmol/L (ref 135–145)
Total Bilirubin: 0.5 mg/dL (ref 0.3–1.2)
Total Protein: 7.2 g/dL (ref 6.5–8.1)

## 2016-07-30 NOTE — Progress Notes (Signed)
*  PRELIMINARY RESULTS* Echocardiogram 2D Echocardiogram has been performed.  Kathleen Burns 07/30/2016, 3:33 PM

## 2016-07-31 NOTE — Telephone Encounter (Signed)
[phone] pt calling back

## 2016-07-31 NOTE — Telephone Encounter (Signed)
Spoke with pt. She was calling back in regards to lab work not a [employer]. Pt has been advised of her lab results. Nothing further was needed.

## 2016-08-04 ENCOUNTER — Other Ambulatory Visit (HOSPITAL_COMMUNITY)
Admission: RE | Admit: 2016-08-04 | Discharge: 2016-08-04 | Disposition: A | Discharge status: Home / Self Care | Payer: 59 | Source: Ambulatory Visit | Attending: Pulmonary Disease | Admitting: Pulmonary Disease

## 2016-08-04 DIAGNOSIS — R944 Abnormal results of kidney function studies: Secondary | ICD-10-CM | POA: Diagnosis present

## 2016-08-04 LAB — BASIC METABOLIC PANEL
Anion gap: 7 (ref 5–15)
BUN: 19 mg/dL (ref 6–20)
CALCIUM: 9.3 mg/dL (ref 8.9–10.3)
CHLORIDE: 99 mmol/L — AB (ref 101–111)
CO2: 29 mmol/L (ref 22–32)
CREATININE: 1 mg/dL (ref 0.44–1.00)
GFR calc non Af Amer: 59 mL/min — ABNORMAL LOW (ref >60)
Glucose, Bld: 115 mg/dL — ABNORMAL HIGH (ref 65–99)
Potassium: 4 mmol/L (ref 3.5–5.1)
SODIUM: 135 mmol/L (ref 135–145)

## 2016-08-12 ENCOUNTER — Ambulatory Visit (INDEPENDENT_AMBULATORY_CARE_PROVIDER_SITE_OTHER): Payer: 59 | Admitting: Gastroenterology

## 2016-08-12 ENCOUNTER — Ambulatory Visit (INDEPENDENT_AMBULATORY_CARE_PROVIDER_SITE_OTHER)
Admission: RE | Admit: 2016-08-12 | Discharge: 2016-08-12 | Disposition: A | Discharge status: Home / Self Care | Payer: 59 | Source: Ambulatory Visit | Attending: Pulmonary Disease | Admitting: Pulmonary Disease

## 2016-08-12 ENCOUNTER — Ambulatory Visit (INDEPENDENT_AMBULATORY_CARE_PROVIDER_SITE_OTHER): Payer: 59 | Admitting: Pulmonary Disease

## 2016-08-12 ENCOUNTER — Encounter: Payer: Self-pay | Admitting: Pulmonary Disease

## 2016-08-12 ENCOUNTER — Encounter: Payer: Self-pay | Admitting: Gastroenterology

## 2016-08-12 VITALS — BP 112/70 | HR 91 | Ht 64.5 in | Wt 161.0 lb

## 2016-08-12 VITALS — BP 118/70 | HR 88 | Ht 64.5 in | Wt 161.0 lb

## 2016-08-12 DIAGNOSIS — I272 Pulmonary hypertension, unspecified: Secondary | ICD-10-CM | POA: Diagnosis not present

## 2016-08-12 DIAGNOSIS — K31A0 Gastric intestinal metaplasia, unspecified: Secondary | ICD-10-CM

## 2016-08-12 DIAGNOSIS — Z1211 Encounter for screening for malignant neoplasm of colon: Secondary | ICD-10-CM

## 2016-08-12 DIAGNOSIS — R1310 Dysphagia, unspecified: Secondary | ICD-10-CM

## 2016-08-12 DIAGNOSIS — K449 Diaphragmatic hernia without obstruction or gangrene: Secondary | ICD-10-CM

## 2016-08-12 DIAGNOSIS — J441 Chronic obstructive pulmonary disease with (acute) exacerbation: Secondary | ICD-10-CM

## 2016-08-12 DIAGNOSIS — K219 Gastro-esophageal reflux disease without esophagitis: Secondary | ICD-10-CM | POA: Diagnosis not present

## 2016-08-12 DIAGNOSIS — K3189 Other diseases of stomach and duodenum: Secondary | ICD-10-CM | POA: Diagnosis not present

## 2016-08-12 MED ORDER — OMEPRAZOLE 40 MG PO CPDR: 40.0000 mg | DELAYED_RELEASE_CAPSULE | Freq: Two times a day (BID) | ORAL | 3 refills | Status: DC

## 2016-08-12 MED ORDER — TADALAFIL 20 MG PO TABS: ORAL_TABLET | ORAL | 0 refills | Status: DC

## 2016-08-12 NOTE — Patient Instructions (Signed)
Chest x-ray today Started on tadalafil 40 mg daily  Call us back if you develop green sputum or wheezing

## 2016-08-12 NOTE — Assessment & Plan Note (Signed)
She does not seem to be having a COPD exacerbation She will continue on her breo & incruse She will call us should she develop green sputum or wheezing  I do note on PFTs and DLCO is decreased out of proportion to have obstruction in this may indicate effect of pulmonary hypertension

## 2016-08-12 NOTE — Progress Notes (Signed)
HPI :  61 year old female with a history of GERD, dysphagia, large hiatal hernia, severe COPD on oxygen, here for follow-up visit.   Since her last visit she had a EGD 07/04/16 - benign stenosis at the GEJ, dilated to 82m, massive paraesophageal hiatal hernia, gastritis. Biopsies negative for H pylori but showed intestinal metaplasia without dysplasia on random biopsies. No focal lesions noted.   She was started on omeprazole '40mg'$  once daily for gastritis, referred to general surgery for consideration for hernia repair, scheduled to see them in the next 1-2 weeks.   She thinks her dysphagia is improved after the dilation although she continues to have some dysphagia but less severe. She thinks it is occurring mostly with solid foods. She denies any heartburn or reflux. No abdominal pains other than constipation which is managed by stool softeners. She reports having [place] is not sure when she is due next or what it showed. No FH of gastric cancer.    Past Medical History:  Diagnosis Date  . Anxiety and depression    H/o suicide attempt  . Breast cancer ([facility]    Left mastectomy in 1990s + chemotherapy  . Chest pain    Cardiac catheterization in 1999, 2003, 2008-normal EF, normal coronaries  . COPD (chronic obstructive pulmonary disease) ([place]   . GERD (gastroesophageal reflux disease)   . Hearing impairment   . Hiatal hernia    large  . Hyperlipidemia   . Hypertension   . Meige disease (lymphedema praecox)    [facility]  . Obesity   . Osteoarthritis   . Pulmonary hypertension   . Skull fracture ([facility] 1969   surgical repair in 1969  . Tobacco abuse    45 pack years     Past Surgical History:  Procedure Laterality Date  . [place]  . CARPAL TUNNEL RELEASE  1996  . CHOLECYSTECTOMY  1996  . COLONOSCOPY  2011   No significant abnormalities  .  ESOPHAGOGASTRODUODENOSCOPY N/A 07/04/2016   Procedure: ESOPHAGOGASTRODUODENOSCOPY (EGD);  Surgeon: SManus Gunning MD;  [facility];  Service: Gastroenterology;  Laterality: N/A;  . EYE SURGERY  1999, 2001  . INCISIONAL HERNIA REPAIR N/A 10/27/2014   Procedure: IFatima BlankHERNIORRHAPHY WITH MESH;  Surgeon: MAviva SignsMd, MD;  Location: [facility];  Service: General;  Laterality: N/A;  . INSERTION OF MESH N/A 10/27/2014   Procedure: INSERTION OF MESH;  Surgeon: MAviva SignsMd, MD;  Location: [facility];  Service: General;  Laterality: N/A;  . KNEE SURGERY  1999   Left  . MASTECTOMY  1990   Left  . SHOULDER SURGERY  1996   Left  . SKULL FRACTURE ELEVATION  1969  . TONSILLECTOMY    . TOTAL ABDOMINAL HYSTERECTOMY W/ BILATERAL SALPINGOOPHORECTOMY  1994   Family History  Problem Relation Age of Onset  . Coronary artery disease  551   + mother, sister  . Hypertension      + father, Sister  . Diabetes      + Sister   Social History  Substance Use Topics  . Smoking status: Current Every Day Smoker    Packs/day: 1.50    Years: 30.00    Types: Cigarettes    Start date: 03/17/1974  . Smokeless tobacco: Never Used     Comment: Encouraged to quit smoking. Counseled on importance of smoking cessation.  . Alcohol  use No   Current Outpatient Prescriptions  Medication Sig Dispense Refill  . albuterol (PROAIR HFA) 108 (90 BASE) MCG/ACT inhaler Inhale 2 puffs into the lungs 4 (four) times daily as needed. For shortness of breath     . ambrisentan (LETAIRIS) 5 MG tablet Take 1 tablet (5 mg total) by mouth daily. 90 tablet 3  . cetirizine (ZYRTEC) 10 MG tablet Take 10 mg by mouth as needed for allergies.     . diphenhydramine-acetaminophen (TYLENOL PM) 25-500 MG TABS tablet Take 2 tablets by mouth at bedtime as needed (SLEEP).    . fluticasone furoate-vilanterol (BREO ELLIPTA) 100-25 MCG/INH AEPB Inhale 1 puff into the lungs daily. 90 each 3  . furosemide (LASIX) 40 MG tablet Take 1  tablet (40 mg total) by mouth daily as needed (if your weight is over 189 lbs). (Patient taking differently: Take 40 mg by mouth daily as needed for fluid (if your weight is over 189 lbs). ) 30 tablet 5  . LORazepam (ATIVAN) 1 MG tablet Take 1 mg by mouth every 8 (eight) hours.      . metoprolol tartrate (LOPRESSOR) 25 MG tablet Take 0.5 tablets (12.5 mg total) by mouth 2 (two) times daily. 90 tablet 3  . montelukast (SINGULAIR) 10 MG tablet Take 10 mg by mouth at bedtime.    . Morphine-Naltrexone 50-2 MG CPCR Take 1 capsule by mouth 2 (two) times daily.    Marland Kitchen omeprazole (PRILOSEC) 40 MG capsule Take 40 mg by mouth 2 (two) times daily.    . ranolazine (RANEXA) 500 MG 12 hr tablet Take 1 tablet (500 mg total) by mouth 2 (two) times daily. 180 tablet 3  . traZODone (DESYREL) 50 MG tablet Take 50 mg by mouth at bedtime.    Marland Kitchen umeclidinium bromide (INCRUSE ELLIPTA) 62.5 MCG/INH AEPB Inhale 1 puff into the lungs daily. 90 each 1  . nitroGLYCERIN (NITROSTAT) 0.4 MG SL tablet Place 1 tablet (0.4 mg total) under the tongue every 5 (five) minutes as needed for chest pain. (Patient not taking: Reported on 08/12/2016) 25 tablet 3   No current facility-administered medications for this visit.    Allergies  Allergen Reactions  . Artane [Trihexyphenidyl] Other (See Comments)    tachycardia  . Aspirin Other (See Comments)    + nonsteroidals-tachycardia  . Cogentin [Benztropine Mesylate] Other (See Comments)    Tachycardia   . Demerol Other (See Comments)    Delirium  . Inderal [Propranolol Hcl] Other (See Comments)    Psychosis  . Levofloxacin Other (See Comments)    Muscle weakness  . Meperidine Other (See Comments)    ANXIETY   . Paxil [Paroxetine Hcl] Other (See Comments)    Anxiety   . Procardia [Nifedipine] Other (See Comments)    HEADACHE  . Proparacaine Other (See Comments)    UNKNOWN  . Benztropine Palpitations and Other (See Comments)    TA  . Penicillins Rash    Has patient had a PCN  reaction causing immediate rash, facial/tongue/throat swelling, SOB or lightheadedness with hypotension: Yes Has patient had a PCN reaction causing severe rash involving mucus membranes or skin necrosis:UNKNOWN Has patient had a PCN reaction that required hospitalization No Has patient had a PCN reaction occurring within the last 10 years: No If all of the above answers are "NO", then may proceed with Cephalosporin use.      Review of Systems: All systems reviewed and negative except where noted in HPI.    Dg Chest 2 View  Result Date: 07/25/2016 CLINICAL DATA:  Preoperative evaluation, history COPD, pulmonary hypertension, diabetes mellitus, breast cancer, smoker, hypertension, hiatal hernia EXAM: CHEST  2 VIEW COMPARISON:  11/06/2015 FINDINGS: Enlargement of cardiac silhouette. Pulmonary vascularity normal. Large hiatal hernia. Emphysematous and bronchitic changes consistent with history of COPD. LEFT basilar atelectasis. No acute infiltrate, pleural effusion or pneumothorax. Bones demineralized. IMPRESSION: COPD changes with LEFT basilar atelectasis. Large hiatal hernia. Mild enlargement of cardiac silhouette. Electronically Signed   By: Lavonia Dana M.D.   On: 07/25/2016 17:24    Physical Exam: BP 118/70 (BP Location: Right Arm, Patient Position: Sitting, Cuff Size: Normal)   Pulse 88   Ht 5' 4.5" (1.638 m) Comment: height measured without shoes  Wt 161 lb (73 kg)   BMI 27.21 kg/m  Constitutional: Pleasant, female in no acute distress, wearing oxygen HEENT: Normocephalic and atraumatic. Conjunctivae are normal. No scleral icterus. Neck supple.  Cardiovascular: Normal rate, regular rhythm.  Pulmonary/chest: Effort normal and breath sounds normal with some slightly decreased B. No wheezing Abdominal: Soft, nondistended, nontender. There are no masses palpable.  Extremities: no edema Lymphadenopathy: No cervical adenopathy noted. Neurological: Alert and oriented to person place and  time. Skin: Skin is warm and dry. No rashes noted. Psychiatric: Normal mood and affect. Behavior is normal.   ASSESSMENT AND PLAN: 61 year old female here for reassessment of the following issues:  Hiatal hernia - massive, most of stomach in the chest. I suspect driving her GERD / dysphagia, and compromising lung function. I do think she would have benefit to her lung function with repair of the hernia, realizing she is higher than average risk for anesthesia. She has been referred to surgery for their opinion, due to see them next week. If she has surgery she can follow up with me afterwards.   GERD / dysphagia - improving following dilation and on PPI, suspect large hiatal hernia driving both of these symptoms  Gastric intestinal metaplasia without dysplasia - noted on random biopsies the stomach. She has no family history of stomach cancer. She had no focal abnormalities otherwise in the stomach. I discussed what this is with her and the potential increased risk of stomach cancer. There are no guidelines for surveillance of this issue in the [place] in the absence of risk factors for stomach cancer. At earliest would consider repeat EGD in 3 years although with her comorbidities the risks may outweigh the benefits of this. Given her lung function will await her surgical evaluation first. She'll continue PPI for now.  Colon cancer screening - remote colonoscopy, unsure of the details, will obtain report to clarify when she is next due if she wishes to continue to have colon cancer screening.  Brazil Cellar, MD [facility] Gastroenterology Pager [phone]

## 2016-08-12 NOTE — Progress Notes (Signed)
   Subjective:    Patient ID: Kathleen Burns, female    DOB: 15-Jan-1956, 61 y.o.   MRN: [id number]  HPI  61 year old smoker with COPD and pulmonary hypertension for acute visit  08/12/2016  Chief Complaint  Patient presents with  . Acute Visit    Chest congestion started Saturday night. Non-productive cough. No chills.    She reports that 3 days ago her breathing worsened and she was not able to walk from one room to the other in her trailer. She denies URI symptoms, fever or sore throat at onset, no sick contacts Accompanied by her caretaker/roommate Pamala Hurry who states that this feels like when she had a pneumonia last month She was year for GI appointment for moderate hiatal hernia and a surgical evaluation is being considered  I note that recent 2-D echo showed worsening pulmonary hypertension with RVSP 71 mm Her weight is at her baseline dry weight of 161 pounds with clothes on She is compliant with Lasix and letairis  She continues to smoke about a pack per day although has decreased now that she is acutely ill   Significant tests/ events   Echo 04/2012:  Normal EF, LVH with diastolic dysfunction and elevated LV end-diastolic pressure, mildly dilated LA, mild decrease in RV function, PA peak pressure 50m. [place]:  Mean PA 35, PCWP 15, Fick CO 4.2, PVR 4-5 Felt to be due to her copd, as well as diastolic dysfxn. ONO RA 2014: desat to 80% >> started on nocturnal oxygen.  PFT's 08/2012:  FEV1 2.11 (75%), ratio 66, +airtrapping, no restriction, DLCO 38%. ONO 2014:  desat to 80% >> started on nocturnal oxygen >> turned back in.  Did not want. September 2016 pulmonary function testing ratio 67%, FEV1 1.86 L (67% predicted), total lung capacity 6.03 L (112% predicted), DLCO 7.97 (29% predicted), residual volume 3.34 L (159% predicted). Started Letairis March 2017.   Started exertional oxygen again in summer 2017  Review of Systems neg for any significant sore throat, dysphagia,  itching, sneezing, nasal congestion or excess/ purulent secretions, fever, chills, sweats, unintended wt loss, pleuritic or exertional cp, hempoptysis, orthopnea pnd or change in chronic leg swelling. Also denies presyncope, palpitations, heartburn, abdominal pain, nausea, vomiting, diarrhea or change in bowel or urinary habits, dysuria,hematuria, rash, arthralgias, visual complaints, headache, numbness weakness or ataxia.     Objective:   Physical Exam  Gen. Pleasant, well-nourished, in no distress ENT - no lesions, no post nasal drip Neck: No JVD, no thyromegaly, no carotid bruits Lungs: no use of accessory muscles, no dullness to percussion, decreased without rales or rhonchi  Cardiovascular: Rhythm regular, heart sounds  normal, no murmurs or gallops, no peripheral edema Musculoskeletal: No deformities, no cyanosis or clubbing        Assessment & Plan:

## 2016-08-12 NOTE — Assessment & Plan Note (Signed)
Her pulmonary hypertension seems to be worse  Does not appear to be in overt cor pulmonale  Will add tadalafil to letairis 6 minute walk and PFTs are pending

## 2016-08-12 NOTE — Patient Instructions (Signed)
If you are age 63 or older, your body mass index should be between 23-30. Your Body mass index is 27.21 kg/m. If this is out of the aforementioned range listed, please consider follow up with your Primary Care Provider.  If you are age 56 or younger, your body mass index should be between 19-25. Your Body mass index is 27.21 kg/m. If this is out of the aformentioned range listed, please consider follow up with your Primary Care Provider.   We have sent the following medications to your pharmacy for you to pick up at your convenience:  Omeprazole  Thank you.

## 2016-08-12 NOTE — Addendum Note (Signed)
Addended by: Valerie Salts on: 08/12/2016 03:28 PM   Modules accepted: Orders

## 2016-08-13 ENCOUNTER — Encounter: Payer: Self-pay | Admitting: Pulmonary Disease

## 2016-08-13 NOTE — Telephone Encounter (Signed)
Called and spoke with pt and her significant other. Pt's insurance will not cover Tadalafil. Pt states that any medication like this is not going to be able to afford. Even if we get it covered by insurance her copay will be to high.  RA - please advise. Thanks.

## 2016-08-14 ENCOUNTER — Telehealth: Payer: Self-pay | Admitting: Gastroenterology

## 2016-08-14 NOTE — Telephone Encounter (Signed)
Colonoscopy report reviewed:  Performed on 05/15/2006. Long tortuous redundant colon with extensive left-sided diverticulosis. No polyps.  The patient is due for colon cancer screening at this time. She has severe COPD with an oxygen requirement. If she has no blood in her stools or other bowel symptoms I think stool based testing would be the best way to perform colon cancer screening for her. If she is interested in colon cancer screening can you please let her know I am recommending stool FIT testing, which would need to be done annually and if positive would lead to colonoscopy. Alternatively if she strongly wishes to have a colonoscopy this would need to be done at the hospital due to her oxygen needs we can avoid this if she is agreeable.Almyra Free can you please let her know. Thanks,

## 2016-08-15 ENCOUNTER — Other Ambulatory Visit: Payer: Self-pay

## 2016-08-15 DIAGNOSIS — Z1211 Encounter for screening for malignant neoplasm of colon: Secondary | ICD-10-CM

## 2016-08-15 NOTE — Telephone Encounter (Signed)
Spoke to Kathleen Burns, as patient was sleeping. Explained that she is due for the colon cancer screening and the two options available. She feels that with everything patient is going through that she would prefer the stool FIT test. They will be in [place] on 08/20/16 and pick up the test. I asked her to call me back if [place] wishes to schedule the colonoscopy. Let her know that the stool FIT test needs to be done yearly and if it is positive then would need to proceed with the colonoscopy.

## 2016-08-19 NOTE — Telephone Encounter (Signed)
BQ - please advise. Thanks. 

## 2016-08-19 NOTE — Telephone Encounter (Signed)
She may need a diff category of medication in that acse for pulmonary hypertension -defer to dr Lake Bells who is her primary pulmonologist

## 2016-08-20 ENCOUNTER — Other Ambulatory Visit: Payer: 59

## 2016-08-21 ENCOUNTER — Encounter: Payer: Self-pay | Admitting: Pulmonary Disease

## 2016-08-21 ENCOUNTER — Encounter: Payer: Self-pay | Admitting: Gastroenterology

## 2016-08-22 ENCOUNTER — Telehealth: Payer: Self-pay

## 2016-08-22 NOTE — Telephone Encounter (Signed)
Paper work started and placed in [employer] to be signed.  See phone from 08-22-16.

## 2016-08-22 NOTE — Telephone Encounter (Signed)
Kathleen Pina   Do you have the paper work that we need to have to start the process for the adcirca?

## 2016-08-22 NOTE — Telephone Encounter (Signed)
Adcirca referral form started and faxed. Received confirmation fax. Forms given to Harmony to f/u on.  Will route to [place].

## 2016-08-22 NOTE — Telephone Encounter (Signed)
Spoke with Caryl Pina - this process has already been initiated - see 2.23.18 phone note Will sign off

## 2016-08-28 MED ORDER — TADALAFIL (PAH) 20 MG PO TABS: 20.0000 mg | ORAL_TABLET | Freq: Every day | ORAL | 1 refills | Status: DC

## 2016-08-28 NOTE — Telephone Encounter (Signed)
[facility] calling back about the adcerica needs a new rx due to precs  Did not have the day supply selected  [phone] opt 2 then opt 1

## 2016-08-28 NOTE — Telephone Encounter (Signed)
Spoke with Bethena Roys at [facility]. Verbal order has been given for Adcirca '20mg'$  1 tablet daily per BQ's documentation on a MyChart message dated 08/13/16. Will route message back to [facility] for follow up.

## 2016-09-02 ENCOUNTER — Encounter: Payer: Self-pay | Admitting: Gastroenterology

## 2016-09-02 ENCOUNTER — Encounter: Payer: Self-pay | Admitting: Pulmonary Disease

## 2016-09-02 NOTE — Telephone Encounter (Signed)
Spoke with [facility], verified that rx has been approved and shipped yesterday (09/01/16) to patient with arrival date being today.  Will close encounter.

## 2016-09-02 NOTE — Telephone Encounter (Signed)
Please see pt email and let us know what to tell her Our med list says 20 mg daily on tadalifil, but RA's last note says 40 mg daily  Please advise, thanks   I have just recieved the Adcirca with me taking the Letairis in the evening should I take the Adcirca in the morning or should they be taken together. Also in the paper work that came with the [place] it keeps saying take '40mg'$  and on the bottle it has take '20mg'$  once daily. Thank you Lanisa

## 2016-09-09 ENCOUNTER — Other Ambulatory Visit (HOSPITAL_COMMUNITY): Payer: Self-pay | Admitting: Surgery

## 2016-09-09 DIAGNOSIS — K449 Diaphragmatic hernia without obstruction or gangrene: Secondary | ICD-10-CM

## 2016-09-11 ENCOUNTER — Ambulatory Visit: Payer: Medicare Other | Admitting: Pulmonary Disease

## 2016-09-11 ENCOUNTER — Ambulatory Visit (INDEPENDENT_AMBULATORY_CARE_PROVIDER_SITE_OTHER): Payer: 59 | Admitting: Pulmonary Disease

## 2016-09-11 ENCOUNTER — Ambulatory Visit (INDEPENDENT_AMBULATORY_CARE_PROVIDER_SITE_OTHER): Payer: 59 | Admitting: Adult Health

## 2016-09-11 ENCOUNTER — Encounter: Payer: Self-pay | Admitting: Adult Health

## 2016-09-11 DIAGNOSIS — Z720 Tobacco use: Secondary | ICD-10-CM

## 2016-09-11 DIAGNOSIS — R0602 Shortness of breath: Secondary | ICD-10-CM

## 2016-09-11 DIAGNOSIS — J438 Other emphysema: Secondary | ICD-10-CM | POA: Diagnosis not present

## 2016-09-11 DIAGNOSIS — I272 Pulmonary hypertension, unspecified: Secondary | ICD-10-CM | POA: Diagnosis not present

## 2016-09-11 DIAGNOSIS — Z9981 Dependence on supplemental oxygen: Secondary | ICD-10-CM

## 2016-09-11 LAB — PULMONARY FUNCTION TEST
DL/VA % PRED: 43 %
DL/VA: 2.17 ml/min/mmHg/L
DLCO cor % pred: 32 %
DLCO cor: 8.73 ml/min/mmHg
DLCO unc % pred: 34 %
DLCO unc: 9.34 ml/min/mmHg
FEF 25-75 PRE: 0.52 L/s
FEF 25-75 Post: 0.93 L/sec
FEF2575-%CHANGE-POST: 79 %
FEF2575-%PRED-POST: 39 %
FEF2575-%Pred-Pre: 21 %
FEV1-%CHANGE-POST: 18 %
FEV1-%PRED-PRE: 49 %
FEV1-%Pred-Post: 59 %
FEV1-PRE: 1.34 L
FEV1-Post: 1.59 L
FEV1FVC-%CHANGE-POST: 5 %
FEV1FVC-%Pred-Pre: 70 %
FEV6-%CHANGE-POST: 14 %
FEV6-%PRED-POST: 79 %
FEV6-%PRED-PRE: 68 %
FEV6-PRE: 2.32 L
FEV6-Post: 2.65 L
FEV6FVC-%Change-Post: 1 %
FEV6FVC-%PRED-PRE: 99 %
FEV6FVC-%Pred-Post: 101 %
FVC-%CHANGE-POST: 12 %
FVC-%PRED-POST: 78 %
FVC-%Pred-Pre: 69 %
FVC-Post: 2.73 L
FVC-Pre: 2.42 L
POST FEV6/FVC RATIO: 97 %
PRE FEV6/FVC RATIO: 96 %
Post FEV1/FVC ratio: 58 %
Pre FEV1/FVC ratio: 55 %
RV % PRED: 133 %
RV: 2.85 L
TLC % pred: 101 %
TLC: 5.44 L

## 2016-09-11 NOTE — Patient Instructions (Addendum)
Continue on current regimen  Continue on oxygen 3l/m with activity .  Follow up Dr. Lake Bells in 2 months and As needed   Work on not smoking .

## 2016-09-11 NOTE — Progress Notes (Signed)
PFT done today. 

## 2016-09-11 NOTE — Progress Notes (Signed)
$'@Patient'R$  ID: Kathleen Burns, female    DOB: [birth year], 61 y.o.   MRN: [id number]  Chief Complaint  Patient presents with  . Follow-up    COPD     Referring provider: Avelino Leeds*  HPI: 61 year old female, smoker followed for COPD and pulmonary hypertension  TEST  Echo 04/2012: Normal EF, LVH with diastolic dysfunction and elevated LV end-diastolic pressure, mildly dilated LA, mild decrease in RV function, PA peak pressure 15m. [place]: Mean PA 35, PCWP 15, Fick CO 4.2, PVR 4-5 Felt to be due to her copd, as well as diastolic dysfxn. ONO RA 2014: desat to 80% >>started on nocturnal oxygen.  PFT's 08/2012: FEV1 2.11 (75%), ratio 66, +airtrapping, no restriction, DLCO 38%. ONO 2014: desat to 80% >>started on nocturnal oxygen >>turned back in. Did not want. September 2016 pulmonary function testing ratio 67%, FEV1 1.86 L (67% predicted), total lung capacity 6.03 L (112% predicted), DLCO 7.97 (29% predicted), residual volume 3.34 L (159% predicted). Started Letairis March 2017.  Started exertional oxygen again in summer 2017  09/11/16 Follow up : COPD /Pulmonary HTN  Patient returns for a 6 week follow-up. She has COPD , maintained on INCRUSE and BREO .  She had PFt done today that showed FEV1 59%, ratio 58, FVC 78%, + BD response, DLCO 34%.  This is slightly decreased FEV1 since 2016 (previously 67%) .  We discussed smoking cessation . Says overall she is doing okay gets winded easily with walking.   She has pulmonary HTN and remains on Letaris and Adcirca. Says she is tolerating meds well. Has chronic ankle edema. Labs were ok with nml LFT /scr in 06/2016 .  Echo on 07/30/16 showed EF 55%, Gr 1 DD, PAP 717mg , RV mild to mod dilation.  She remains on O2 at 3l/m .   on Lasix '40mg'$  daily  .   Allergies  Allergen Reactions  . Artane [Trihexyphenidyl] Other (See Comments)    tachycardia  . Aspirin Other (See Comments)    + nonsteroidals-tachycardia  . Cogentin  [Benztropine Mesylate] Other (See Comments)    Tachycardia   . Demerol Other (See Comments)    Delirium  . Inderal [Propranolol Hcl] Other (See Comments)    Psychosis  . Levofloxacin Other (See Comments)    Muscle weakness  . Meperidine Other (See Comments)    ANXIETY   . Paxil [Paroxetine Hcl] Other (See Comments)    Anxiety   . Procardia [Nifedipine] Other (See Comments)    HEADACHE  . Proparacaine Other (See Comments)    UNKNOWN  . Benztropine Palpitations and Other (See Comments)    TA  . Penicillins Rash    Has patient had a PCN reaction causing immediate rash, facial/tongue/throat swelling, SOB or lightheadedness with hypotension: Yes Has patient had a PCN reaction causing severe rash involving mucus membranes or skin necrosis:UNKNOWN Has patient had a PCN reaction that required hospitalization No Has patient had a PCN reaction occurring within the last 10 years: No If all of the above answers are "NO", then may proceed with Cephalosporin use.     Immunization History  Administered Date(s) Administered  . Influenza Split 05/13/2012  . Influenza,inj,Quad PF,36+ Mos 06/04/2013, 05/10/2015, 02/27/2016  . Influenza-Unspecified 05/30/2014    Past Medical History:  Diagnosis Date  . Anxiety and depression    H/o suicide attempt  . Breast cancer ([facility]   Left mastectomy in 1990s + chemotherapy  . Chest pain    Cardiac catheterization  in 1999, 2003, 2008-normal EF, normal coronaries  . COPD (chronic obstructive pulmonary disease) ([place])   . GERD (gastroesophageal reflux disease)   . Hearing impairment   . Hiatal hernia    large  . Hyperlipidemia   . Hypertension   . Meige disease (lymphedema praecox)    blepharospasm; [facility] at [facility] And treated with botulinum toxin injections  . Obesity   . Osteoarthritis   . Pulmonary hypertension   . Skull fracture [facility]) 1969   surgical repair in 1969  . Tobacco abuse    45 pack years    Tobacco History: History    Smoking Status  . Current Every Day Smoker  . Packs/day: 1.50  . Years: 30.00  . Types: Cigarettes  . Start date: 03/17/1974  Smokeless Tobacco  . Never Used    Comment: Encouraged to quit smoking. Counseled on importance of smoking cessation.   Ready to quit: Not Answered Counseling given: Not Answered   Outpatient Encounter Prescriptions as of 09/11/2016  Medication Sig  . albuterol (PROAIR HFA) 108 (90 BASE) MCG/ACT inhaler Inhale 2 puffs into the lungs 4 (four) times daily as needed. For shortness of breath   . ambrisentan (LETAIRIS) 5 MG tablet Take 1 tablet (5 mg total) by mouth daily.  . cetirizine (ZYRTEC) 10 MG tablet Take 10 mg by mouth as needed for allergies.   . diphenhydramine-acetaminophen (TYLENOL PM) 25-500 MG TABS tablet Take 2 tablets by mouth at bedtime as needed (SLEEP).  . fluticasone furoate-vilanterol (BREO ELLIPTA) 100-25 MCG/INH AEPB Inhale 1 puff into the lungs daily.  . furosemide (LASIX) 40 MG tablet Take 1 tablet (40 mg total) by mouth daily as needed (if your weight is over 189 lbs). (Patient taking differently: Take 40 mg by mouth daily as needed for fluid (if your weight is over 189 lbs). )  . LORazepam (ATIVAN) 1 MG tablet Take 1 mg by mouth every 8 (eight) hours.    . metoprolol tartrate (LOPRESSOR) 25 MG tablet Take 0.5 tablets (12.5 mg total) by mouth 2 (two) times daily.  . montelukast (SINGULAIR) 10 MG tablet Take 10 mg by mouth at bedtime.  . Morphine-Naltrexone 50-2 MG CPCR Take 1 capsule by mouth 2 (two) times daily.  . nitroGLYCERIN (NITROSTAT) 0.4 MG SL tablet Place 1 tablet (0.4 mg total) under the tongue every 5 (five) minutes as needed for chest pain.  Marland Kitchen omeprazole (PRILOSEC) 40 MG capsule Take 1 capsule (40 mg total) by mouth 2 (two) times daily.  . ranolazine (RANEXA) 500 MG 12 hr tablet Take 1 tablet (500 mg total) by mouth 2 (two) times daily.  . tadalafil (CIALIS) 20 MG tablet Take 2 tablets once a day.  . tadalafil, PAH, (ADCIRCA) 20  MG tablet Take 1 tablet (20 mg total) by mouth daily.  . traZODone (DESYREL) 50 MG tablet Take 50 mg by mouth at bedtime.  Marland Kitchen umeclidinium bromide (INCRUSE ELLIPTA) 62.5 MCG/INH AEPB Inhale 1 puff into the lungs daily.   No facility-administered encounter medications on file as of 09/11/2016.      Review of Systems  Constitutional:   No  weight loss, night sweats,  Fevers, chills, + fatigue, or  lassitude.  HEENT:   No headaches,  Difficulty swallowing,  Tooth/dental problems, or  Sore throat,                No sneezing, itching, ear ache,  +nasal congestion, post nasal drip,   CV:  No chest pain,  Orthopnea, PND,  ,  anasarca, dizziness, palpitations, syncope.   GI  No heartburn, indigestion, abdominal pain, nausea, vomiting, diarrhea, change in bowel habits, loss of appetite, bloody stools.   Resp:  .  No chest wall deformity  Skin: no rash or lesions.  GU: no dysuria, change in color of urine, no urgency or frequency.  No flank pain, no hematuria   MS:  No joint pain or swelling.  No decreased range of motion.  No back pain.    Physical Exam  BP 96/68 (BP Location: Right Arm, Cuff Size: Normal)   Pulse 95   Ht '5\' 6"'$  (1.676 m)   Wt 157 lb (71.2 kg)   SpO2 90%   BMI 25.34 kg/m   GEN: A/Ox3; pleasant , NAD, chronically ill appearing , on o2    HEENT:  [place]/AT,  EACs-clear, TMs-wnl, NOSE-clear, THROAT-clear, no lesions, no postnasal drip or exudate noted. Poor dentition.   NECK:  Supple w/ fair ROM; no JVD; normal carotid impulses w/o bruits; no thyromegaly or nodules palpated; no lymphadenopathy.    RESP  Decreased BS in bases ,  no accessory muscle use, no dullness to percussion  CARD:  RRR, no m/r/g, 1+  peripheral edema, pulses intact, no cyanosis or clubbing.  GI:   Soft & nt; nml bowel sounds; no organomegaly or masses detected.   Musco: Warm bil, no deformities or joint swelling noted.   Neuro: alert, no focal deficits noted.    Skin: Warm, no lesions or  rashes   Lab Results:  CBC    Component Value Date/Time   WBC 11.9 (H) 07/25/2016 1706   RBC 4.48 07/25/2016 1706   HGB 15.0 07/25/2016 1706   HCT 44.8 07/25/2016 1706   PLT 320.0 07/25/2016 1706   MCV 100.0 07/25/2016 1706   MCH 33.1 10/25/2014 1500   MCHC 33.5 07/25/2016 1706   RDW 13.7 07/25/2016 1706   LYMPHSABS 3.1 07/25/2016 1706   MONOABS 0.9 07/25/2016 1706   EOSABS 0.0 07/25/2016 1706   BASOSABS 0.0 07/25/2016 1706    BMET    Component Value Date/Time   NA 135 08/04/2016 1622   K 4.0 08/04/2016 1622   CL 99 (L) 08/04/2016 1622   CO2 29 08/04/2016 1622   GLUCOSE 115 (H) 08/04/2016 1622   BUN 19 08/04/2016 1622   CREATININE 1.00 08/04/2016 1622   CREATININE 1.09 04/02/2012 1636   CALCIUM 9.3 08/04/2016 1622   GFRNONAA 59 (L) 08/04/2016 1622   GFRAA >60 08/04/2016 1622    BNP No results found for: BNP  ProBNP    Component Value Date/Time   PROBNP 357.50 (H) 11/06/2015 1718    Imaging: No results found.   Assessment & Plan:   COPD (chronic obstructive pulmonary disease) with emphysema Smoking cessation discussed  Compesnated on present regimen   Plan  Patient Instructions  Continue on current regimen  Continue on oxygen 3l/m with activity .  Follow up Dr. Lake Bells in 2 months and As needed   Work on not smoking .       Pulmonary hypertension Very severe pulmonary HTN , on Letaria and Adcirca  Tolerating medications  Cont on O2 and diuretics  Keep follow up in 2 month with labs.    Tobacco abuse Smoking cessation   Oxygen dependent Cont on O2 .    Tammy Parrett NP-C  [facility]

## 2016-09-12 ENCOUNTER — Ambulatory Visit (HOSPITAL_COMMUNITY): Payer: 59

## 2016-09-15 ENCOUNTER — Encounter: Payer: Self-pay | Admitting: Pulmonary Disease

## 2016-09-15 ENCOUNTER — Encounter: Payer: Self-pay | Admitting: Gastroenterology

## 2016-09-16 NOTE — Assessment & Plan Note (Signed)
Cont on O2 .  

## 2016-09-16 NOTE — Assessment & Plan Note (Signed)
Smoking cessation discussed  Compesnated on present regimen   Plan  Patient Instructions  Continue on current regimen  Continue on oxygen 3l/m with activity .  Follow up Dr. Lake Bells in 2 months and As needed   Work on not smoking .

## 2016-09-16 NOTE — Assessment & Plan Note (Signed)
Very severe pulmonary HTN , on [place] and Adcirca  Tolerating medications  Cont on O2 and diuretics  Keep follow up in 2 month with labs.

## 2016-09-16 NOTE — Assessment & Plan Note (Signed)
Smoking cessation  

## 2016-09-22 ENCOUNTER — Other Ambulatory Visit (INDEPENDENT_AMBULATORY_CARE_PROVIDER_SITE_OTHER): Payer: 59

## 2016-09-22 ENCOUNTER — Encounter: Payer: Self-pay | Admitting: Gastroenterology

## 2016-09-22 DIAGNOSIS — Z1211 Encounter for screening for malignant neoplasm of colon: Secondary | ICD-10-CM | POA: Diagnosis not present

## 2016-09-22 LAB — FECAL OCCULT BLOOD, IMMUNOCHEMICAL: Fecal Occult Bld: NEGATIVE

## 2016-09-29 ENCOUNTER — Encounter: Payer: Self-pay | Admitting: Pulmonary Disease

## 2016-09-29 NOTE — Telephone Encounter (Signed)
Called [employer] to initiate PA for Kathleen Burns, [phone] Spoke with Baxter Kail, this has already been approved as of 2:57pm today and does not need a PA.  Patient aware via e-mail response of approval. Nothing further needed.

## 2016-10-01 ENCOUNTER — Telehealth: Payer: Self-pay | Admitting: Pulmonary Disease

## 2016-10-01 ENCOUNTER — Telehealth: Payer: Self-pay | Admitting: *Deleted

## 2016-10-01 NOTE — Telephone Encounter (Signed)
I spoke with [facility] and advised will not be giving clearance today b/c BQ is not in the office  She verbalized understanding   Ash- did you want to see about working her in with BQ since she is indeed an est pt? She was just had for 6MW and PFT on 09/11/16 , so maybe he will be okay with giving a verbal when he returns? Please advise thanks!

## 2016-10-01 NOTE — Telephone Encounter (Signed)
Went to initiate the PA for the adcirca for the pt and this has been rejected by her insurance.  BQ please advise of an alternative. thanks

## 2016-10-01 NOTE — Telephone Encounter (Signed)
Spoke with Pamala Hurry who is on pt's dpr and informed her that the discussion was that the provider felt that it would be more suitable for the pt to see BQ seeing as she is already established with him and the type of surgery. They are aware that BQ is unavailable this week in regards to an appointment.   Caryl Pina, this is the pt I asked you about earlier today, Dewitt Hoes still has the paperwork

## 2016-10-02 NOTE — Telephone Encounter (Signed)
BQ please advise if pt needs to be seen by you for sx clearance, or if this can be done without patient being seen- Last office visit: 09/11/16 with 67m and pft done that day.  Thanks!

## 2016-10-03 ENCOUNTER — Ambulatory Visit: Payer: 59 | Admitting: Pulmonary Disease

## 2016-10-05 NOTE — Telephone Encounter (Signed)
Sildenafil 20mg tid

## 2016-10-05 NOTE — Telephone Encounter (Signed)
Yes, I'm OK with her having surgery as I am the one who referred her for the surgery.

## 2016-10-06 ENCOUNTER — Encounter: Payer: Self-pay | Admitting: Pulmonary Disease

## 2016-10-06 NOTE — Telephone Encounter (Signed)
Pt states that she received a letter stating that this medication was covered and is approved through 06/29/2017.  Approved 09/29/16 through 06/29/2017 --- pt advised in letter that her medication will be filled at the pharmacy.   Will send to Dr Lake Bells as Juluis Rainier. Nothing further needed.

## 2016-10-06 NOTE — Telephone Encounter (Signed)
Spoke with Veleta Miners from [facility] to inform her that BQ has signed the form and it has been faxed to [phone]. Received confirmation fax was successful. Placed forms in Ashley's cubby.  Pt was made aware of clearance via email. Nothing further is needed

## 2016-10-16 ENCOUNTER — Encounter: Payer: Self-pay | Admitting: Pulmonary Disease

## 2016-10-21 ENCOUNTER — Encounter: Payer: Self-pay | Admitting: Pulmonary Disease

## 2016-10-21 ENCOUNTER — Encounter: Payer: Self-pay | Admitting: Gastroenterology

## 2016-10-23 ENCOUNTER — Telehealth: Payer: Self-pay | Admitting: Pulmonary Disease

## 2016-10-23 MED ORDER — TADALAFIL (PAH) 20 MG PO TABS: 20.0000 mg | ORAL_TABLET | Freq: Every day | ORAL | 1 refills | Status: DC

## 2016-10-23 NOTE — Telephone Encounter (Signed)
Spoke with Rajul at [facility], requesting refill of Adcirca.  Per last OVs 08/2016, BQ advised to continue Adcirca.  This has been refilled. Nothing further needed.

## 2016-10-26 ENCOUNTER — Encounter: Payer: Self-pay | Admitting: Pulmonary Disease

## 2016-10-27 ENCOUNTER — Encounter: Payer: Self-pay | Admitting: Gastroenterology

## 2016-10-27 NOTE — Telephone Encounter (Signed)
PM  Please Advise-  Please see pt email

## 2016-10-28 MED ORDER — BUPROPION HCL ER (SR) 150 MG PO TB12: ORAL_TABLET | ORAL | 5 refills | Status: DC

## 2016-11-11 ENCOUNTER — Encounter: Payer: Self-pay | Admitting: Pulmonary Disease

## 2016-11-11 DIAGNOSIS — J438 Other emphysema: Secondary | ICD-10-CM

## 2016-11-11 DIAGNOSIS — I272 Pulmonary hypertension, unspecified: Secondary | ICD-10-CM

## 2016-11-17 ENCOUNTER — Ambulatory Visit: Payer: 59 | Admitting: Pulmonary Disease

## 2016-11-26 ENCOUNTER — Inpatient Hospital Stay: Admission: RE | Admit: 2016-11-26 | Payer: 59 | Source: Ambulatory Visit

## 2016-11-26 ENCOUNTER — Ambulatory Visit: Payer: 59 | Admitting: Pulmonary Disease

## 2016-11-28 ENCOUNTER — Other Ambulatory Visit: Payer: Self-pay

## 2016-11-28 ENCOUNTER — Telehealth: Payer: Self-pay | Admitting: Pulmonary Disease

## 2016-11-28 MED ORDER — TADALAFIL (PAH) 20 MG PO TABS: 20.0000 mg | ORAL_TABLET | Freq: Every day | ORAL | 11 refills | Status: DC

## 2016-11-28 NOTE — Telephone Encounter (Signed)
Pt needs qualifying sats at next OV.  Note has been made on appt notes as a reminder.  Nothing further needed.

## 2016-11-29 NOTE — Addendum Note (Signed)
Addendum  created 11/29/16 1007 by Duane Boston, MD   Sign clinical note

## 2016-11-29 NOTE — Addendum Note (Signed)
Addendum  created 11/29/16 0801 by Duane Boston, MD   Sign clinical note

## 2016-12-03 ENCOUNTER — Other Ambulatory Visit: Payer: Self-pay | Admitting: Adult Health

## 2016-12-23 ENCOUNTER — Telehealth: Payer: Self-pay | Admitting: Acute Care

## 2016-12-23 NOTE — Telephone Encounter (Signed)
This patient was supposed to resume screening 08/2016. Can we get her scheduled for a scan within the next few weeks? Thanks.

## 2016-12-24 NOTE — Telephone Encounter (Signed)
Pt is scheduled for Low dose chest ct 01/21/17.  Nothing further needed.

## 2016-12-27 ENCOUNTER — Encounter: Payer: Self-pay | Admitting: Pulmonary Disease

## 2017-01-16 ENCOUNTER — Other Ambulatory Visit: Payer: Self-pay

## 2017-01-16 MED ORDER — TADALAFIL (PAH) 20 MG PO TABS: 20.0000 mg | ORAL_TABLET | Freq: Every day | ORAL | 11 refills | Status: DC

## 2017-01-21 ENCOUNTER — Other Ambulatory Visit (INDEPENDENT_AMBULATORY_CARE_PROVIDER_SITE_OTHER): Payer: 59

## 2017-01-21 ENCOUNTER — Ambulatory Visit (INDEPENDENT_AMBULATORY_CARE_PROVIDER_SITE_OTHER): Payer: 59 | Admitting: Pulmonary Disease

## 2017-01-21 ENCOUNTER — Ambulatory Visit
Admission: RE | Admit: 2017-01-21 | Discharge: 2017-01-21 | Disposition: A | Discharge status: Home / Self Care | Payer: 59 | Source: Ambulatory Visit | Attending: Acute Care | Admitting: Acute Care

## 2017-01-21 ENCOUNTER — Encounter: Payer: Self-pay | Admitting: Pulmonary Disease

## 2017-01-21 VITALS — BP 134/84 | HR 81 | Ht 66.0 in | Wt 159.0 lb

## 2017-01-21 DIAGNOSIS — J438 Other emphysema: Secondary | ICD-10-CM

## 2017-01-21 DIAGNOSIS — Z5181 Encounter for therapeutic drug level monitoring: Secondary | ICD-10-CM

## 2017-01-21 DIAGNOSIS — F17210 Nicotine dependence, cigarettes, uncomplicated: Secondary | ICD-10-CM

## 2017-01-21 DIAGNOSIS — Z87891 Personal history of nicotine dependence: Secondary | ICD-10-CM | POA: Diagnosis not present

## 2017-01-21 DIAGNOSIS — I272 Pulmonary hypertension, unspecified: Secondary | ICD-10-CM | POA: Diagnosis not present

## 2017-01-21 DIAGNOSIS — Z9981 Dependence on supplemental oxygen: Secondary | ICD-10-CM

## 2017-01-21 DIAGNOSIS — F17200 Nicotine dependence, unspecified, uncomplicated: Secondary | ICD-10-CM | POA: Diagnosis not present

## 2017-01-21 LAB — HEPATIC FUNCTION PANEL
ALBUMIN: 4.2 g/dL (ref 3.5–5.2)
ALT: 5 U/L (ref 0–35)
AST: 11 U/L (ref 0–37)
Alkaline Phosphatase: 54 U/L (ref 39–117)
BILIRUBIN DIRECT: 0 mg/dL (ref 0.0–0.3)
TOTAL PROTEIN: 7.5 g/dL (ref 6.0–8.3)
Total Bilirubin: 0.5 mg/dL (ref 0.2–1.2)

## 2017-01-21 NOTE — Patient Instructions (Addendum)
For your COPD: Stop smoking Continue Breo and [place]  For your chronic respiratory failure with hypoxemia: Use 2 L O2 continuously  For your pulmonary hypertension: Continue taking your Letairis and Joanie [place]ding[place] We will get a 6 minute walk on the next visit We will check liver function testing to monitor for toxicity from Letairis We will order an echocardiogram when we see you back in 6 weeks  For your recent history of diaphragmatic hernia and gastric surgery: We will check a chest x-ray today  For your tobacco abuse: Continue efforts to quit smoking If you're still struggling with tobacco abuse on the next visit we can consider Chantix  We will see you back in 6 weeks with a nurse practitioner or sooner if needed

## 2017-01-21 NOTE — Progress Notes (Signed)
Subjective:    Patient ID: Kathleen Burns, female    DOB: [birth year], 61 y.o.   MRN: [id number]  Synopsis: Former patient of Kathleen Burns with COPD and pulmonary hypertension;  She had a massive hiatal hernia which was taking up a significantin her thorax and in May 2018 she had surgical correction of this at [facility]. She had a laparoscopic gastropexy.   Echo 04/2012:  Normal EF, LVH with diastolic dysfunction and elevated LV end-diastolic pressure, mildly dilated LA, mild decrease in RV function, PA peak pressure 102mm. Kathleen Burns 2014:  Mean PA 35, PCWP 15, Fick CO 4.2, PVR 4-5 Felt to be due to her copd, as well as diastolic dysfxn. ONO RA 2014: desat to 80% >> started on nocturnal oxygen.  PFT's 08/2012:  FEV1 2.11 (75%), ratio 66, +airtrapping, no restriction, DLCO 38%. ONO 2014:  desat to 80% >> started on nocturnal oxygen >> turned back in.  Did not want. September 2016 pulmonary function testing ratio 67%, FEV1 1.86 L (67% predicted), total lung capacity 6.03 L (112% predicted), DLCO 7.97 (29% predicted), residual volume 3.34 L (159% predicted). Started Letairis March 2017.   Started exertional oxygen again in summer 2017 01/2016 6MW 139m, O2 saturation 90% on 3L Kathleen Burns  HPI Chief Complaint  Patient presents with  . Follow-up    pt states she is doing well, notes some sob with exertion.     Kathleen Burns had her laparoscopic gastropexy and she says she is doing OK.  She has some back and hip pain.  Swallowing is sometimes hard if she has a dry mouth, but otherwise she is doing OK.    Her breathing has been OK, some good days, some bad days.  Today feels like a great day. She has needed less oxygen today.  She is still using and benefiting from the oxygen all the time.    No cough, no wheezing.  No coughing up anything. She is still taking the letairis and adcirca.   She has cut way back on her tobacco use.  She is smoking less than one half pack per day.  She vapes some.    She is still taking her Breo  and Incruise.    Past Medical History:  Diagnosis Date  . Anxiety and depression    H/o suicide attempt  . Breast cancer ([place])    Left mastectomy in 1990s + chemotherapy  . Chest pain    Cardiac catheterization in 1999, 2003, 2008-normal EF, normal coronaries  . COPD (chronic obstructive pulmonary disease) ([place])   . GERD (gastroesophageal reflux disease)   . Hearing impairment   . Hiatal hernia    large  . Hyperlipidemia   . Hypertension   . Meige disease (lymphedema praecox)    blepharospasm; followed at Kathleen Burns And treated with botulinum toxin injections  . Obesity   . Osteoarthritis   . Pulmonary hypertension ([place])   . Skull fracture [facility]) 1969   surgical repair in 1969  . Tobacco abuse    45 pack years      Review of Systems  Constitutional: Positive for fatigue. Negative for chills and fever.  HENT: Negative for postnasal drip, rhinorrhea and sinus pressure.   Respiratory: Positive for cough and shortness of breath. Negative for wheezing.   Cardiovascular: Positive for chest pain. Negative for palpitations and leg swelling.       Objective:   Physical Exam Vitals:   01/21/17 1449  BP: 134/84  Pulse: 81  SpO2: 94%  Weight:  159 lb (72.1 kg)  Height: 5\' 6"  (1.676 m)   RA  Gen: chronically ill appearing HENT: OP clear, TM's clear, neck supple PULM: poor air movement, no wheezing B, normal percussion CV: RRR, no mgr, trace edema GI: BS+, soft, nontender Derm: no cyanosis or rash Psyche: normal mood and affect    Records from her hospitalization at [facility] reviewed  BMET    Component Value Date/Time   NA 135 08/04/2016 1622   K 4.0 08/04/2016 1622   CL 99 (L) 08/04/2016 1622   CO2 29 08/04/2016 1622   GLUCOSE 115 (H) 08/04/2016 1622   BUN 19 08/04/2016 1622   CREATININE 1.00 08/04/2016 1622   CREATININE 1.09 04/02/2012 1636   CALCIUM 9.3 08/04/2016 1622   GFRNONAA 59 (L) 08/04/2016 1622   GFRAA >60 08/04/2016 1622        Assessment  & Plan:  Other emphysema ([place]) - Plan: Ambulatory Referral for DME, DG Chest 2 View  Pulmonary hypertension ([place]) - Plan: Ambulatory Referral for DME  Oxygen dependent - Plan: Ambulatory Referral for DME  Therapeutic drug monitoring - Plan: Hepatic function panel  Tobacco use disorder   Discussion: I am pleased that Kathleen Burns was able to have her laparoscopic gastropexy to correct her massive hiatal hernia. I'm hopeful that this will cause improvement in her respiratory symptoms, chronic rescue therapy with hypoxemia, and hopefully her pulmonary hypertension.  She continues to smoke cigarettes though she is making good effort to cut back.  In terms of her pulmonary hypertension this seems to have been a stable interval. It's not surprising to hear that she needed some dopamine after her general anesthesia recently.  My goal for her now that she's had this surgery is to cut back on her oxygen as we are able this year, repeat an echocardiogram later this year, repeat a 6 minute walk test in about 6 weeks, and hopefully back off on her pulmonary hypertension medicines this year.  Plan: For your COPD: Stop smoking Continue Breo and Incruise  For your chronic respiratory failure with hypoxemia: Use 2 L O2 continuously  For your pulmonary hypertension: Continue taking your Letairis and Kathleen Burns We will get a 6 minute walk on the next visit We will check liver function testing to monitor for toxicity from Letairis We will order an echocardiogram when we see you back in 6 weeks  For your recent history of diaphragmatic hernia and gastric surgery: We will check a chest x-ray today  For your tobacco abuse: Continue efforts to quit smoking If you're still struggling with tobacco abuse on the next visit we can consider Chantix  We will see you back in 6 weeks with a nurse practitioner or sooner if needed    Current Outpatient Prescriptions:  .  albuterol (PROAIR HFA) 108 (90 BASE)  MCG/ACT inhaler, Inhale 2 puffs into the lungs 4 (four) times daily as needed. For shortness of breath , Disp: , Rfl:  .  ambrisentan (LETAIRIS) 5 MG tablet, Take 1 tablet (5 mg total) by mouth daily., Disp: 90 tablet, Rfl: 3 .  buPROPion (WELLBUTRIN SR) 150 MG 12 hr tablet, Take 1 tab daily X3 days, then increase to 1 tab BID, Disp: 60 tablet, Rfl: 5 .  cetirizine (ZYRTEC) 10 MG tablet, Take 10 mg by mouth as needed for allergies. , Disp: , Rfl:  .  diphenhydramine-acetaminophen (TYLENOL PM) 25-500 MG TABS tablet, Take 2 tablets by mouth at bedtime as needed (SLEEP)., Disp: , Rfl:  .  fluticasone furoate-vilanterol (BREO ELLIPTA) 100-25 MCG/INH AEPB, Inhale 1 puff into the lungs daily., Disp: 90 each, Rfl: 3 .  furosemide (LASIX) 40 MG tablet, Take 1 tablet (40 mg total) by mouth daily as needed (if your weight is over 189 lbs). (Patient taking differently: Take 40 mg by mouth daily as needed for fluid (if your weight is over 189 lbs). ), Disp: 30 tablet, Rfl: 5 .  LORazepam (ATIVAN) 1 MG tablet, Take 1 mg by mouth every 8 (eight) hours.  , Disp: , Rfl:  .  metoprolol tartrate (LOPRESSOR) 25 MG tablet, TAKE 1/2 TABLET BY MOUTH TWICE A DAY, Disp: 90 tablet, Rfl: 3 .  montelukast (SINGULAIR) 10 MG tablet, Take 10 mg by mouth at bedtime., Disp: , Rfl:  .  Morphine-Naltrexone 50-2 MG CPCR, Take 1 capsule by mouth 2 (two) times daily., Disp: , Rfl:  .  nitroGLYCERIN (NITROSTAT) 0.4 MG SL tablet, Place 1 tablet (0.4 mg total) under the tongue every 5 (five) minutes as needed for chest pain., Disp: 25 tablet, Rfl: 3 .  omeprazole (PRILOSEC) 40 MG capsule, Take 1 capsule (40 mg total) by mouth 2 (two) times daily., Disp: 90 capsule, Rfl: 3 .  RANEXA 500 MG 12 hr tablet, TAKE 1 TABLET BY MOUTH TWICE A DAY., Disp: 180 tablet, Rfl: 3 .  tadalafil, PAH, (ADCIRCA) 20 MG tablet, Take 1 tablet (20 mg total) by mouth daily., Disp: 30 tablet, Rfl: 11 .  traZODone (DESYREL) 50 MG tablet, Take 50 mg by mouth at  bedtime., Disp: , Rfl:  .  umeclidinium bromide (INCRUSE ELLIPTA) 62.5 MCG/INH AEPB, Inhale 1 puff into the lungs daily., Disp: 90 each, Rfl: 1

## 2017-01-22 ENCOUNTER — Encounter: Payer: Self-pay | Admitting: Pulmonary Disease

## 2017-01-23 ENCOUNTER — Other Ambulatory Visit: Payer: Self-pay | Admitting: Acute Care

## 2017-01-23 DIAGNOSIS — F17210 Nicotine dependence, cigarettes, uncomplicated: Secondary | ICD-10-CM

## 2017-01-26 ENCOUNTER — Telehealth: Payer: Self-pay | Admitting: Pulmonary Disease

## 2017-01-26 NOTE — Telephone Encounter (Signed)
Called [facility] as directed in the message - spoke with representative Dannika who reported that pt's Breo does NOT need a prior authorization  LMOM TCB x1 w/ specialty pharmacy/PAH services to discuss

## 2017-01-27 NOTE — Telephone Encounter (Signed)
Called April to make aware that [facility] does not need a PA- Pt is also not on Breo.  Per April, a [place] for [facility] is needed and not [place].  Pharmacy is [facility].    Called [facility] to initiate PA for Letairis, which has been approved through 06/29/2017.  PA#: [id number].    Called back and spoke with Corrie at [facility] back to make aware of approval.  Nothing further needed.

## 2017-02-09 ENCOUNTER — Other Ambulatory Visit: Payer: Self-pay

## 2017-02-09 MED ORDER — FUROSEMIDE 40 MG PO TABS: 40.0000 mg | ORAL_TABLET | Freq: Every day | ORAL | 5 refills | Status: DC | PRN

## 2017-03-05 ENCOUNTER — Ambulatory Visit: Payer: 59

## 2017-03-05 ENCOUNTER — Ambulatory Visit: Payer: 59 | Admitting: Adult Health

## 2017-03-10 ENCOUNTER — Other Ambulatory Visit: Payer: Self-pay

## 2017-03-10 MED ORDER — METOPROLOL TARTRATE 25 MG PO TABS: 12.5000 mg | ORAL_TABLET | Freq: Two times a day (BID) | ORAL | 3 refills | Status: DC

## 2017-03-10 NOTE — Telephone Encounter (Signed)
Fax rerquest for metoprolol done

## 2017-03-23 ENCOUNTER — Ambulatory Visit: Payer: 59

## 2017-03-23 ENCOUNTER — Ambulatory Visit: Payer: 59 | Admitting: Adult Health

## 2017-04-15 ENCOUNTER — Other Ambulatory Visit: Payer: Self-pay | Admitting: Gastroenterology

## 2017-04-20 ENCOUNTER — Encounter: Payer: Self-pay | Admitting: Adult Health

## 2017-04-20 ENCOUNTER — Ambulatory Visit (INDEPENDENT_AMBULATORY_CARE_PROVIDER_SITE_OTHER): Payer: 59 | Admitting: *Deleted

## 2017-04-20 ENCOUNTER — Other Ambulatory Visit (INDEPENDENT_AMBULATORY_CARE_PROVIDER_SITE_OTHER): Payer: 59

## 2017-04-20 ENCOUNTER — Ambulatory Visit (INDEPENDENT_AMBULATORY_CARE_PROVIDER_SITE_OTHER): Payer: 59 | Admitting: Adult Health

## 2017-04-20 VITALS — BP 124/82 | HR 96 | Ht 65.0 in | Wt 142.0 lb

## 2017-04-20 DIAGNOSIS — Z23 Encounter for immunization: Secondary | ICD-10-CM

## 2017-04-20 DIAGNOSIS — J9611 Chronic respiratory failure with hypoxia: Secondary | ICD-10-CM | POA: Diagnosis not present

## 2017-04-20 DIAGNOSIS — I272 Pulmonary hypertension, unspecified: Secondary | ICD-10-CM

## 2017-04-20 DIAGNOSIS — J438 Other emphysema: Secondary | ICD-10-CM | POA: Diagnosis not present

## 2017-04-20 DIAGNOSIS — R0609 Other forms of dyspnea: Secondary | ICD-10-CM | POA: Diagnosis not present

## 2017-04-20 LAB — HEPATIC FUNCTION PANEL
ALBUMIN: 3.9 g/dL (ref 3.5–5.2)
ALT: 5 U/L (ref 0–35)
AST: 11 U/L (ref 0–37)
Alkaline Phosphatase: 45 U/L (ref 39–117)
BILIRUBIN TOTAL: 0.5 mg/dL (ref 0.2–1.2)
Bilirubin, Direct: 0.1 mg/dL (ref 0.0–0.3)
Total Protein: 7.2 g/dL (ref 6.0–8.3)

## 2017-04-20 NOTE — Progress Notes (Deleted)
SIX MIN WALK 09/11/2016 02/27/2016 11/06/2015 11/06/2015 11/06/2015 08/15/2015 03/06/2015  Medications Metoprolol 12.5mg , Morphine 50-2mg , Ranexa 500mg - taken approx 4:00 am.  Norco, Omeprazole, Morphine, Ranexa - - - Amlodipine 5mg , Cetirizine 10mg , Metoprolol 25mg , Morphine-Naltrexonce 60-2.4mg , Omeprazole 20mg , Ranolazine 500mg . -  Supplimental Oxygen during Test? (L/min) No Yes Yes Yes No No No  O2 Flow Rate - 3 3 2  - - -  Type - Continuous Continuous Continuous - - -  Laps 5 3 - - - 6 -  Partial Lap (in Meters) 0 0 - - - 17 -  Baseline BP (sitting) 126/64 100/88 - - - 116/84 -  Baseline Heartrate 99 101 - - - 117 -  Baseline Dyspnea (Borg Scale) 4 0 - - - 1 -  Baseline Fatigue (Borg Scale) 4 6 - - - 3 -  Baseline SPO2 92 94 - - - 94 -  BP (sitting) 136/78 112/92 - - - 142/88 -  Heartrate 114 118 - - - 117 -  Dyspnea (Borg Scale) 7 8 - - - 5 -  Fatigue (Borg Scale) 7 8 - - - 7 -  SPO2 80 90 - - - 89 -  BP (sitting) 116/62 102/82 - - - 144/92 -  Heartrate 105 106 - - - 109 -  SPO2 92 94 - - - 94 -  Stopped or Paused before Six Minutes Yes Yes - - - No -  Other Symptoms at end of Exercise Patient stopped 4:05 in to walk d/t dizziness.  Pt noted dizziness throughout walk, was holding on to walls.  I advised pt to stop test d/t her presentation of worsening dizziness.   Patient stopped with 2 minutes and 55 seconds on the timer. Stated that she could not finish the test due to her "multiple disabilities." - - - - -  Interpretation Dizziness - - - - Dizziness;Hip pain -  Distance Completed 240 144 - - - 305 -  Tech Comments: Pt walked a slow to moderate pace, notable dizziness during test.  Pt stopped test with 1:55 left in test d/t worsening dizziness.  - - did not complete laps 2 and 3 due to 02 desaturation did not complete lap 1 due to 02 desaturation Pt walked at a lower rate with no breaks. I rechecked her BP before she left 138/88 pt tolerated walk well, walked a moderate pace.

## 2017-04-20 NOTE — Patient Instructions (Addendum)
Continue on current regimen  Labs today .  Continue on Oxygen  Work on not smoking  Flu shot today .  Follow up Dr. Lake Bells in 3 months and As needed

## 2017-04-20 NOTE — Progress Notes (Signed)
@Patient  ID: Kathleen Burns, female    DOB: [birth year], 61 y.o.   MRN: [id number]  Chief Complaint  Patient presents with  . Follow-up    follow up copd    Referring provider: Avelino Leeds*  HPI: 61 year old female, smoker followed for COPD and pulmonary hypertension  TEST   Echo 04/2012:  Normal EF, LVH with diastolic dysfunction and elevated LV end-diastolic pressure, mildly dilated LA, mild decrease in RV function, PA peak pressure 80mm. [place] 2014:  Mean PA 35, PCWP 15, Fick CO 4.2, PVR 4-5 Felt to be due to her copd, as well as diastolic dysfxn. ONO RA 2014: desat to 80% >> started on nocturnal oxygen.  PFT's 08/2012:  FEV1 2.11 (75%), ratio 66, +airtrapping, no restriction, DLCO 38%. ONO 2014:  desat to 80% >> started on nocturnal oxygen >> turned back in.  Did not want. September 2016 pulmonary function testing ratio 67%, FEV1 1.86 L (67% predicted), total lung capacity 6.03 L (112% predicted), DLCO 7.97 (29% predicted), residual volume 3.34 L (159% predicted). Started Letairis March 2017.   Started exertional oxygen again in summer 2017 01/2016 6MW 120m, O2 saturation 90% on 3L [place] 08/2016 PFt done today that showed FEV1 59%, ratio 58, FVC 78%, + BD response, DLCO 34%.  Echo on 07/30/16 showed EF 55%, Gr 1 DD, PAP 34mmHg , RV mild to mod dilation.    04/21/2017 Follow up : COPD /Pulmonary HTN  Pt returns for a 3 month follow up .  She has underlying COPD. She continues on INCRUSE and BREO  She does continue to smoke. Trying to cut back. Unable to take Chantix , has tried in past but has not been unable to tolerate Had LDCT chest in 12/2016 -RADS 3S , probable benign . rec for 6 mon follow up .   Remains on Oxygen 2.5l/m . Says breathing is doing okay , gets winded with activity , esp walking .  Has Pulmonary HTN on Adcirca , Letaris and lasix .  Says swelling is at baseline .  6 min walk test was 288 m , on 2l/m O2.   Allergies  Allergen Reactions  . Artane  [Trihexyphenidyl] Other (See Comments)    tachycardia  . Aspirin Other (See Comments)    + nonsteroidals-tachycardia  . Cogentin [Benztropine Mesylate] Other (See Comments)    Tachycardia   . Demerol Other (See Comments)    Delirium  . Inderal [Propranolol Hcl] Other (See Comments)    Psychosis  . Levofloxacin Other (See Comments)    Muscle weakness  . Meperidine Other (See Comments)    ANXIETY   . Paxil [Paroxetine Hcl] Other (See Comments)    Anxiety   . Procardia [Nifedipine] Other (See Comments)    HEADACHE  . Proparacaine Other (See Comments)    UNKNOWN  . Benztropine Palpitations and Other (See Comments)    TA  . Penicillins Rash    Has patient had a PCN reaction causing immediate rash, facial/tongue/throat swelling, SOB or lightheadedness with hypotension: Yes Has patient had a PCN reaction causing severe rash involving mucus membranes or skin necrosis:UNKNOWN Has patient had a PCN reaction that required hospitalization No Has patient had a PCN reaction occurring within the last 10 years: No If all of the above answers are "NO", then may proceed with Cephalosporin use.     Immunization History  Administered Date(s) Administered  . Influenza Split 05/13/2012  . Influenza,inj,Quad PF,6+ Mos 06/04/2013, 05/10/2015, 02/27/2016, 04/20/2017  . Influenza-Unspecified 05/30/2014  Past Medical History:  Diagnosis Date  . Anxiety and depression    H/o suicide attempt  . Breast cancer ([place])    Left mastectomy in 1990s + chemotherapy  . Chest pain    Cardiac catheterization in 1999, 2003, 2008-normal EF, normal coronaries  . COPD (chronic obstructive pulmonary disease) ([place])   . GERD (gastroesophageal reflux disease)   . Hearing impairment   . Hiatal hernia    large  . Hyperlipidemia   . Hypertension   . Meige disease (lymphedema praecox)    blepharospasm; followed at [facility] And treated with botulinum toxin injections  . Obesity   . Osteoarthritis   . Pulmonary  hypertension ([place])   . Skull fracture [facility]) 1969   surgical repair in 1969  . Tobacco abuse    45 pack years    Tobacco History: History  Smoking Status  . Current Every Day Smoker  . Packs/day: 0.50  . Years: 30.00  . Types: Cigarettes  . Start date: 03/17/1974  Smokeless Tobacco  . Never Used    Comment: down to 0.5ppd 01/21/17   Ready to quit: No Counseling given: Yes   Outpatient Encounter Prescriptions as of 04/20/2017  Medication Sig  . albuterol (PROAIR HFA) 108 (90 BASE) MCG/ACT inhaler Inhale 2 puffs into the lungs 4 (four) times daily as needed. For shortness of breath   . ambrisentan (LETAIRIS) 5 MG tablet Take 1 tablet (5 mg total) by mouth daily.  Marland Kitchen buPROPion (WELLBUTRIN SR) 150 MG 12 hr tablet Take 1 tab daily X3 days, then increase to 1 tab BID  . cetirizine (ZYRTEC) 10 MG tablet Take 10 mg by mouth as needed for allergies.   . diphenhydramine-acetaminophen (TYLENOL PM) 25-500 MG TABS tablet Take 2 tablets by mouth at bedtime as needed (SLEEP).  . fluticasone furoate-vilanterol (BREO ELLIPTA) 100-25 MCG/INH AEPB Inhale 1 puff into the lungs daily.  . furosemide (LASIX) 40 MG tablet Take 1 tablet (40 mg total) by mouth daily as needed (if your weight is over 189 lbs).  . LORazepam (ATIVAN) 1 MG tablet Take 1 mg by mouth every 8 (eight) hours.    . metoprolol tartrate (LOPRESSOR) 25 MG tablet Take 0.5 tablets (12.5 mg total) by mouth 2 (two) times daily.  . montelukast (SINGULAIR) 10 MG tablet Take 10 mg by mouth at bedtime.  . Morphine-Naltrexone 50-2 MG CPCR Take 1 capsule by mouth 2 (two) times daily.  . nitroGLYCERIN (NITROSTAT) 0.4 MG SL tablet Place 1 tablet (0.4 mg total) under the tongue every 5 (five) minutes as needed for chest pain.  Marland Kitchen omeprazole (PRILOSEC) 40 MG capsule TAKE 1 CAPSULE BY MOUTH TWICE A DAY.  Marland Kitchen RANEXA 500 MG 12 hr tablet TAKE 1 TABLET BY MOUTH TWICE A DAY.  . tadalafil, PAH, (ADCIRCA) 20 MG tablet Take 1 tablet (20 mg total) by mouth daily.   . traZODone (DESYREL) 50 MG tablet Take 50 mg by mouth at bedtime.  Marland Kitchen umeclidinium bromide (INCRUSE ELLIPTA) 62.5 MCG/INH AEPB Inhale 1 puff into the lungs daily.   No facility-administered encounter medications on file as of 04/20/2017.      Review of Systems  Constitutional:   No  weight loss, night sweats,  Fevers, chills, + fatigue, or  lassitude.  HEENT:   No headaches,  Difficulty swallowing,  Tooth/dental problems, or  Sore throat,                No sneezing, itching, ear ache, nasal congestion, post nasal drip,  CV:  No chest pain,  Orthopnea, PND, swelling in lower extremities, anasarca, dizziness, palpitations, syncope.   GI  No heartburn, indigestion, abdominal pain, nausea, vomiting, diarrhea, change in bowel habits, loss of appetite, bloody stools.   Resp:    No chest wall deformity  Skin: no rash or lesions.  GU: no dysuria, change in color of urine, no urgency or frequency.  No flank pain, no hematuria   MS:  No joint pain or swelling.  No decreased range of motion.  No back pain.    Physical Exam  BP 124/82 (BP Location: Right Arm, Cuff Size: Normal)   Pulse 96   Ht 5\' 5"  (1.651 m)   Wt 142 lb (64.4 kg)   SpO2 94%   BMI 23.63 kg/m   GEN: A/Ox3; pleasant , NAD, chronically ill appearing on o2    HEENT:  [place]/AT,  EACs-clear, TMs-wnl, NOSE-clear, THROAT-clear, no lesions, no postnasal drip or exudate noted.   NECK:  Supple w/ fair ROM; no JVD; normal carotid impulses w/o bruits; no thyromegaly or nodules palpated; no lymphadenopathy.    RESP  Clear  P & A; w/o, wheezes/ rales/ or rhonchi. no accessory muscle use, no dullness to percussion  CARD:  RRR, no m/r/g, tr  peripheral edema, pulses intact, no cyanosis or clubbing.  GI:   Soft & nt; nml bowel sounds; no organomegaly or masses detected.   Musco: Warm bil, no deformities or joint swelling noted.   Neuro: alert, no focal deficits noted.    Skin: Warm, no lesions or rashes    Lab  Results:  CBC   BNP No results found for: BNP  ProBNP   Imaging: No results found.   Assessment & Plan:   COPD (chronic obstructive pulmonary disease) with emphysema Stable without flare   Plan  Patient Instructions  Continue on current regimen  Labs today .  Continue on Oxygen  Work on not smoking  Flu shot today .  Follow up Dr. Lake Bells in 3 months and As needed       Pulmonary hypertension 6 min walk test stable  Cont on current regimen  Check LFT today    Chronic respiratory failure with hypoxia (HCC) Cont on O2 .      Rexene Edison, NP 04/21/2017

## 2017-04-21 DIAGNOSIS — J9611 Chronic respiratory failure with hypoxia: Secondary | ICD-10-CM | POA: Insufficient documentation

## 2017-04-21 NOTE — Assessment & Plan Note (Signed)
6 min walk test stable  Cont on current regimen  Check LFT today

## 2017-04-21 NOTE — Assessment & Plan Note (Signed)
Cont on O2 .  

## 2017-04-21 NOTE — Assessment & Plan Note (Signed)
Stable without flare   Plan  Patient Instructions  Continue on current regimen  Labs today .  Continue on Oxygen  Work on not smoking  Flu shot today .  Follow up Dr. Lake Bells in 3 months and As needed

## 2017-04-21 NOTE — Progress Notes (Signed)
SIX MIN WALK 04/20/2017 09/11/2016 02/27/2016 11/06/2015 11/06/2015 11/06/2015 08/15/2015  Medications at 3:30 am   pt took the following medications---lopressor, morphine, omeprazole, ranexa Metoprolol 12.5mg , Morphine 50-2mg , Ranexa 500mg - taken approx 4:00 am.  Norco, Omeprazole, Morphine, Ranexa - - - Amlodipine 5mg , Cetirizine 10mg , Metoprolol 25mg , Morphine-Naltrexonce 60-2.4mg , Omeprazole 20mg , Ranolazine 500mg .  Supplimental Oxygen during Test? (L/min) Yes No Yes Yes Yes No No  O2 Flow Rate 2 - 3 3 2  - -  Type Continuous - Continuous Continuous Continuous - -  Laps 6 5 3  - - - 6  Partial Lap (in Meters) 0 0 0 - - - 17  Baseline BP (sitting) 126/80 126/64 100/88 - - - 116/84  Baseline Heartrate 98 99 101 - - - 117  Baseline Dyspnea (Borg Scale) 0 4 0 - - - 1  Baseline Fatigue (Borg Scale) 3 4 6  - - - 3  Baseline SPO2 92 92 94 - - - 94  BP (sitting) 140/82 136/78 112/92 - - - 142/88  Heartrate 110 114 118 - - - 117  Dyspnea (Borg Scale) 3 7 8  - - - 5  Fatigue (Borg Scale) 4 7 8  - - - 7  SPO2 92 80 90 - - - 89  BP (sitting) 136/80 116/62 102/82 - - - 144/92  Heartrate 104 105 106 - - - 109  SPO2 96 92 94 - - - 94  Stopped or Paused before Six Minutes Yes Yes Yes - - - No  Other Symptoms at end of Exercise clock at 4:47 (at 57 meters) pt dropped to 87% on room air and pulse at 103--pt placed on 2 liters at this time.  pt stopped the test with the clock at 33 seconds left due to severe fatigue.   Patient stopped 4:05 in to walk d/t dizziness.  Pt noted dizziness throughout walk, was holding on to walls.  I advised pt to stop test d/t her presentation of worsening dizziness.   Patient stopped with 2 minutes and 55 seconds on the timer. Stated that she could not finish the test due to her "multiple disabilities." - - - -  Interpretation Dizziness;Hip pain Dizziness - - - - Dizziness;Hip pain  Distance Completed 288 240 144 - - - 305  Tech Comments: pt stated that she did experience left hip pain due  to a bad back and some dizziness that is usual for her.  Pt walked a slow to moderate pace, notable dizziness during test.  Pt stopped test with 1:55 left in test d/t worsening dizziness.  - - did not complete laps 2 and 3 due to 02 desaturation did not complete lap 1 due to 02 desaturation Pt walked at a lower rate with no breaks. I rechecked her BP before she left 138/88

## 2017-04-27 ENCOUNTER — Ambulatory Visit (HOSPITAL_COMMUNITY)
Admission: RE | Admit: 2017-04-27 | Discharge: 2017-04-27 | Disposition: A | Discharge status: Home / Self Care | Payer: 59 | Source: Ambulatory Visit | Attending: Adult Health | Admitting: Adult Health

## 2017-04-27 DIAGNOSIS — I10 Essential (primary) hypertension: Secondary | ICD-10-CM | POA: Diagnosis not present

## 2017-04-27 DIAGNOSIS — I272 Pulmonary hypertension, unspecified: Secondary | ICD-10-CM | POA: Diagnosis not present

## 2017-04-27 DIAGNOSIS — Z853 Personal history of malignant neoplasm of breast: Secondary | ICD-10-CM | POA: Insufficient documentation

## 2017-04-27 DIAGNOSIS — Z720 Tobacco use: Secondary | ICD-10-CM | POA: Insufficient documentation

## 2017-04-27 DIAGNOSIS — I209 Angina pectoris, unspecified: Secondary | ICD-10-CM | POA: Insufficient documentation

## 2017-04-27 DIAGNOSIS — E785 Hyperlipidemia, unspecified: Secondary | ICD-10-CM | POA: Diagnosis not present

## 2017-04-27 DIAGNOSIS — J449 Chronic obstructive pulmonary disease, unspecified: Secondary | ICD-10-CM | POA: Diagnosis not present

## 2017-04-27 DIAGNOSIS — I071 Rheumatic tricuspid insufficiency: Secondary | ICD-10-CM | POA: Insufficient documentation

## 2017-04-27 NOTE — Progress Notes (Signed)
*  PRELIMINARY RESULTS* Echocardiogram 2D Echocardiogram has been performed.  Kathleen Burns 04/27/2017, 2:40 PM

## 2017-05-01 ENCOUNTER — Ambulatory Visit: Payer: 59 | Admitting: Cardiovascular Disease

## 2017-05-19 ENCOUNTER — Encounter: Payer: Self-pay | Admitting: Cardiovascular Disease

## 2017-05-19 ENCOUNTER — Ambulatory Visit (INDEPENDENT_AMBULATORY_CARE_PROVIDER_SITE_OTHER): Payer: 59 | Admitting: Cardiovascular Disease

## 2017-05-19 VITALS — BP 126/78 | HR 101 | Ht 64.0 in | Wt 149.0 lb

## 2017-05-19 DIAGNOSIS — I2510 Atherosclerotic heart disease of native coronary artery without angina pectoris: Secondary | ICD-10-CM | POA: Diagnosis not present

## 2017-05-19 DIAGNOSIS — Z720 Tobacco use: Secondary | ICD-10-CM | POA: Diagnosis not present

## 2017-05-19 DIAGNOSIS — I2583 Coronary atherosclerosis due to lipid rich plaque: Secondary | ICD-10-CM | POA: Diagnosis not present

## 2017-05-19 DIAGNOSIS — I10 Essential (primary) hypertension: Secondary | ICD-10-CM | POA: Diagnosis not present

## 2017-05-19 DIAGNOSIS — I272 Pulmonary hypertension, unspecified: Secondary | ICD-10-CM

## 2017-05-19 DIAGNOSIS — E785 Hyperlipidemia, unspecified: Secondary | ICD-10-CM | POA: Diagnosis not present

## 2017-05-19 NOTE — Progress Notes (Signed)
SUBJECTIVE: The patient presents for annual follow-up.  She has a history of stable nonobstructive coronary artery disease, hypertension, and tobacco abuse. She has a history of COPD and pulmonary hypertension and surgical correction of a massive hiatal hernia.  Most recent echocardiogram performed on 04/27/17 showed normal left ventricular systolic and diastolic function, LVEF 47-09%, mild left atrial dilatation, and pulmonary pressures of 57 mmHg.  This is a reduction from 71 mmHg on 07/30/16.  She is doing fairly well overall.  She denies chest pain and leg swelling.  She has felt much better since undergoing surgery for her hiatal hernia.  She sometimes feels dizzy when taking Ranexa and after withholding it, symptoms improved.    Review of Systems: As per "subjective", otherwise negative.  Allergies  Allergen Reactions  . Artane [Trihexyphenidyl] Other (See Comments)    tachycardia  . Aspirin Other (See Comments)    + nonsteroidals-tachycardia  . Cogentin [Benztropine Mesylate] Other (See Comments)    Tachycardia   . Demerol Other (See Comments)    Delirium  . Inderal [Propranolol Hcl] Other (See Comments)    Psychosis  . Levofloxacin Other (See Comments)    Muscle weakness  . Meperidine Other (See Comments)    ANXIETY   . Paxil [Paroxetine Hcl] Other (See Comments)    Anxiety   . Procardia [Nifedipine] Other (See Comments)    HEADACHE  . Proparacaine Other (See Comments)    UNKNOWN  . Benztropine Palpitations and Other (See Comments)    TA  . Penicillins Rash    Has patient had a PCN reaction causing immediate rash, facial/tongue/throat swelling, SOB or lightheadedness with hypotension: Yes Has patient had a PCN reaction causing severe rash involving mucus membranes or skin necrosis:UNKNOWN Has patient had a PCN reaction that required hospitalization No Has patient had a PCN reaction occurring within the last 10 years: No If all of the above answers are "NO",  then may proceed with Cephalosporin use.     Current Outpatient Medications  Medication Sig Dispense Refill  . albuterol (PROAIR HFA) 108 (90 BASE) MCG/ACT inhaler Inhale 2 puffs into the lungs 4 (four) times daily as needed. For shortness of breath     . ambrisentan (LETAIRIS) 5 MG tablet Take 1 tablet (5 mg total) by mouth daily. 90 tablet 3  . buPROPion (WELLBUTRIN SR) 150 MG 12 hr tablet Take 1 tab daily X3 days, then increase to 1 tab BID 60 tablet 5  . cetirizine (ZYRTEC) 10 MG tablet Take 10 mg by mouth as needed for allergies.     . diphenhydramine-acetaminophen (TYLENOL PM) 25-500 MG TABS tablet Take 2 tablets by mouth at bedtime as needed (SLEEP).    . fluticasone furoate-vilanterol (BREO ELLIPTA) 100-25 MCG/INH AEPB Inhale 1 puff into the lungs daily. 90 each 3  . furosemide (LASIX) 40 MG tablet Take 1 tablet (40 mg total) by mouth daily as needed (if your weight is over 189 lbs). 30 tablet 5  . LORazepam (ATIVAN) 1 MG tablet Take 1 mg by mouth every 8 (eight) hours.      . metoprolol tartrate (LOPRESSOR) 25 MG tablet Take 0.5 tablets (12.5 mg total) by mouth 2 (two) times daily. 90 tablet 3  . montelukast (SINGULAIR) 10 MG tablet Take 10 mg by mouth at bedtime.    . Morphine-Naltrexone 50-2 MG CPCR Take 1 capsule by mouth 2 (two) times daily.    . nitroGLYCERIN (NITROSTAT) 0.4 MG SL tablet Place 1 tablet (0.4  mg total) under the tongue every 5 (five) minutes as needed for chest pain. 25 tablet 3  . omeprazole (PRILOSEC) 40 MG capsule TAKE 1 CAPSULE BY MOUTH TWICE A DAY. 180 capsule 3  . RANEXA 500 MG 12 hr tablet TAKE 1 TABLET BY MOUTH TWICE A DAY. 180 tablet 3  . tadalafil, PAH, (ADCIRCA) 20 MG tablet Take 1 tablet (20 mg total) by mouth daily. 30 tablet 11  . traZODone (DESYREL) 50 MG tablet Take 50 mg by mouth at bedtime.    Marland Kitchen umeclidinium bromide (INCRUSE ELLIPTA) 62.5 MCG/INH AEPB Inhale 1 puff into the lungs daily. 90 each 1   No current facility-administered medications for  this visit.     Past Medical History:  Diagnosis Date  . Anxiety and depression    H/o suicide attempt  . Breast cancer ([place])    Left mastectomy in 1990s + chemotherapy  . Chest pain    Cardiac catheterization in 1999, 2003, 2008-normal EF, normal coronaries  . COPD (chronic obstructive pulmonary disease) ([place])   . GERD (gastroesophageal reflux disease)   . Hearing impairment   . Hiatal hernia    large  . Hyperlipidemia   . Hypertension   . Meige disease (lymphedema praecox)    blepharospasm; followed at [facility] And treated with botulinum toxin injections  . Obesity   . Osteoarthritis   . Pulmonary hypertension ([place])   . Skull fracture [facility]) 1969   surgical repair in 1969  . Tobacco abuse    45 pack years    Past Surgical History:  Procedure Laterality Date  . [place] and  . CARPAL TUNNEL RELEASE  1996  . CHOLECYSTECTOMY  1996  . COLONOSCOPY  2011   No significant abnormalities  . ESOPHAGOGASTRODUODENOSCOPY N/A 07/04/2016   Procedure: ESOPHAGOGASTRODUODENOSCOPY (EGD);  Surgeon: Manus Gunning, MD;  Location: [facility];  Service: Gastroenterology;  Laterality: N/A;  . EYE SURGERY  1999, 2001  . INCISIONAL HERNIA REPAIR N/A 10/27/2014   Procedure: Fatima Blank HERNIORRHAPHY WITH MESH;  Surgeon: Aviva Signs Md, MD;  Location: [facility];  Service: General;  Laterality: N/A;  . INSERTION OF MESH N/A 10/27/2014   Procedure: INSERTION OF MESH;  Surgeon: Aviva Signs Md, MD;  Location: [facility];  Service: General;  Laterality: N/A;  . KNEE SURGERY  1999   Left  . MASTECTOMY  1990   Left  . SHOULDER SURGERY  1996   Left  . SKULL FRACTURE ELEVATION  1969  . TONSILLECTOMY    . TOTAL ABDOMINAL HYSTERECTOMY W/ BILATERAL SALPINGOOPHORECTOMY  1994    Social History   Socioeconomic History  . Marital status: Single    Spouse name: Not on file  . Number of children: 0  . Years of education: Not on file  . Highest education level: Not on file  Social Needs  .  Financial resource strain: Not on file  . Food insecurity - worry: Not on file  . Food insecurity - inability: Not on file  . Transportation needs - medical: Not on file  . Transportation needs - non-medical: Not on file  Occupational History  . Occupation: Disabled    Comment: previously Tourist information centre manager with [employer]  Tobacco Use  . Smoking status: Current Every Day Smoker    Packs/day: 0.50    Years: 30.00    Pack years: 15.00    Types: Cigarettes    Start date: 03/17/1974  . Smokeless tobacco: Never Used  . Tobacco comment: down to 0.5ppd  01/21/17  Substance and Sexual Activity  . Alcohol use: No    Alcohol/week: 0.0 oz  . Drug use: No    Comment: remote marijuana--QUIT SMOKING IN 1990's  . Sexual activity: Not on file  Other Topics Concern  . Not on file  Social History Narrative  . Not on file     Vitals:   05/19/17 1525  BP: 126/78  Pulse: (!) 101  SpO2: 90%  Weight: 149 lb (67.6 kg)  Height: 5\' 4"  (1.626 m)    Wt Readings from Last 3 Encounters:  05/19/17 149 lb (67.6 kg)  04/20/17 142 lb (64.4 kg)  01/21/17 159 lb (72.1 kg)     PHYSICAL EXAM General: NAD HEENT: Poor dentition Neck: No JVD, no thyromegaly. Lungs: Diminished air entry b/l, no rales or wheezes. CV: Nondisplaced PMI. Regular rate and rhythm, normal S1/loud P2, no S3/S4, no murmur. No pretibial or periankle edema.   Abdomen: Soft,no distention.  Neurologic: Alert and oriented.  Psych: Normal affect. Skin: Normal.    ECG: Most recent ECG reviewed.   Labs: Lab Results  Component Value Date/Time   K 4.0 08/04/2016 04:22 PM   BUN 19 08/04/2016 04:22 PM   CREATININE 1.00 08/04/2016 04:22 PM   CREATININE 1.09 04/02/2012 04:36 PM   ALT 5 04/20/2017 05:01 PM   TSH 1.496 03/02/2012 04:15 PM   HGB 15.0 07/25/2016 05:06 PM     Lipids: Lab Results  Component Value Date/Time   LDLCALC 149 (H) 06/16/2012 04:25 PM   CHOL 241 (H) 06/16/2012 04:25 PM   TRIG 176 (H) 06/16/2012 04:25 PM    HDL 57 06/16/2012 04:25 PM       ASSESSMENT AND PLAN: 1. CAD: Stable nonobstructive CAD.   She had no significant epicardial disease at the time of her last catheterization.  I will discontinue Ranexa as prior symptoms were likely related to her hiatal hernia.  2. Essential HTN: Controlled. No changes.  3. Tobacco abuse: Cessation counseling previously provided.  4. COPD: 22 with pulmonary. Stable on present regimen.    5. Pulmonary hypertension: Currently on tadalafil and ambrisentan.  Follows with pulmonary.    Disposition: Follow up as needed   Kate Sable, M.D., F.A.C.C.

## 2017-05-19 NOTE — Patient Instructions (Signed)
Medication Instructions:  Your physician has recommended you make the following change in your medication:  Stop Taking Ranexa  Labwork: NONE   Testing/Procedures: NONE   Follow-Up: Your physician recommends that you schedule a follow-up appointment As Needed    Any Other Special Instructions Will Be Listed Below (If Applicable).     If you need a refill on your cardiac medications before your next appointment, please call your pharmacy.  Thank you for choosing [facility]!

## 2017-05-29 ENCOUNTER — Encounter: Payer: Self-pay | Admitting: Cardiovascular Disease

## 2017-06-01 ENCOUNTER — Other Ambulatory Visit: Payer: Self-pay

## 2017-06-01 MED ORDER — RANOLAZINE ER 500 MG PO TB12: 500.0000 mg | ORAL_TABLET | Freq: Two times a day (BID) | ORAL | 6 refills | Status: DC

## 2017-06-01 NOTE — Telephone Encounter (Signed)
[facility] [place], Ranexa 500 mg BID

## 2017-07-08 ENCOUNTER — Telehealth: Payer: Self-pay | Admitting: Pulmonary Disease

## 2017-07-08 MED ORDER — TADALAFIL (PAH) 20 MG PO TABS: 20.0000 mg | ORAL_TABLET | Freq: Every day | ORAL | 11 refills | Status: DC

## 2017-07-08 NOTE — Telephone Encounter (Signed)
Per pt chart, no PA has been initiated by our office.. Called [facility], apparently a PA was faxed in to [facility] for Adcirca.   Spoke with rep to provide additional info needed to process PA- PA approved through 06/29/2018.    Reference [id number]   Pharmacy made aware of approval.  Nothing further needed.

## 2017-07-23 ENCOUNTER — Other Ambulatory Visit: Payer: Self-pay | Admitting: Pulmonary Disease

## 2017-07-23 ENCOUNTER — Ambulatory Visit: Payer: 59 | Admitting: Pulmonary Disease

## 2017-07-24 ENCOUNTER — Encounter: Payer: Self-pay | Admitting: Pulmonary Disease

## 2017-07-24 ENCOUNTER — Other Ambulatory Visit (INDEPENDENT_AMBULATORY_CARE_PROVIDER_SITE_OTHER): Payer: 59

## 2017-07-24 ENCOUNTER — Other Ambulatory Visit: Payer: 59

## 2017-07-24 ENCOUNTER — Ambulatory Visit (INDEPENDENT_AMBULATORY_CARE_PROVIDER_SITE_OTHER): Payer: 59 | Admitting: Pulmonary Disease

## 2017-07-24 VITALS — BP 128/76 | HR 109 | Ht 64.0 in | Wt 148.0 lb

## 2017-07-24 DIAGNOSIS — I272 Pulmonary hypertension, unspecified: Secondary | ICD-10-CM

## 2017-07-24 DIAGNOSIS — J9611 Chronic respiratory failure with hypoxia: Secondary | ICD-10-CM | POA: Diagnosis not present

## 2017-07-24 DIAGNOSIS — Z5181 Encounter for therapeutic drug level monitoring: Secondary | ICD-10-CM

## 2017-07-24 DIAGNOSIS — J438 Other emphysema: Secondary | ICD-10-CM

## 2017-07-24 DIAGNOSIS — F17200 Nicotine dependence, unspecified, uncomplicated: Secondary | ICD-10-CM | POA: Diagnosis not present

## 2017-07-24 LAB — COMPREHENSIVE METABOLIC PANEL
ALT: 5 U/L (ref 0–35)
AST: 10 U/L (ref 0–37)
Albumin: 4 g/dL (ref 3.5–5.2)
Alkaline Phosphatase: 56 U/L (ref 39–117)
BILIRUBIN TOTAL: 0.4 mg/dL (ref 0.2–1.2)
BUN: 26 mg/dL — ABNORMAL HIGH (ref 6–23)
CALCIUM: 9.8 mg/dL (ref 8.4–10.5)
CO2: 27 meq/L (ref 19–32)
CREATININE: 1.03 mg/dL (ref 0.40–1.20)
Chloride: 99 mEq/L (ref 96–112)
GFR: 57.49 mL/min — ABNORMAL LOW (ref >60.00)
Glucose, Bld: 98 mg/dL (ref 70–99)
Potassium: 4.5 mEq/L (ref 3.5–5.1)
Sodium: 133 mEq/L — ABNORMAL LOW (ref 135–145)
Total Protein: 7.5 g/dL (ref 6.0–8.3)

## 2017-07-24 LAB — BRAIN NATRIURETIC PEPTIDE: Pro B Natriuretic peptide (BNP): 37 pg/mL (ref 0.0–100.0)

## 2017-07-24 NOTE — Progress Notes (Signed)
Subjective:    Patient ID: Kathleen Burns, female    DOB: 12-14-1955, 62 y.o.   MRN: [id number]  Synopsis: Former patient of Dr. Gwenette Greet with COPD and pulmonary hypertension;  She had a massive hiatal hernia which was taking up a significantin her thorax and in May 2018 she had surgical correction of this at [facility]. She had a laparoscopic gastropexy.  Started Letairis March 2017.   Started exertional oxygen again in summer 2017   HPI Chief Complaint  Patient presents with  . Follow-up    pt c/o stable dyspnea, also notes runny nose, sinus congestion.     Davene thinks she is OK. She doesn't feel "a whole lot better", but she is OK  She continues to have a "smoker's cough" > she is planning to have a CT scan next week > she is not coughing up mucus > does not feel congested  She sometimes feels dyspnea > walking down her hall at home is hard, has more dyspnea walking then > her oxygen has been 90-92% on Room Air  Chronic respiratory failure with hypoxemia: > she is not sleeping on oxygen consistently  > she is not using oxygen when walking around  She has a little pain in the right rib cage from time to time that fels like a knife.  She is gaining weight and says that her appetite is worse.   Her weight has been stable between 144-148.   She is still smoking cigarettes, probably 2 packs per day at the worst.     Past Medical History:  Diagnosis Date  . Anxiety and depression    H/o suicide attempt  . Breast cancer ([place])    Left mastectomy in 1990s + chemotherapy  . Chest pain    Cardiac catheterization in 1999, 2003, 2008-normal EF, normal coronaries  . COPD (chronic obstructive pulmonary disease) ([place])   . GERD (gastroesophageal reflux disease)   . Hearing impairment   . Hiatal hernia    large  . Hyperlipidemia   . Hypertension   . Meige disease (lymphedema praecox)    blepharospasm; [facility] at [facility] And treated with botulinum toxin injections  . Obesity   .  Osteoarthritis   . Pulmonary hypertension ([place])   . Skull fracture [facility]) 1969   surgical repair in 1969  . Tobacco abuse    45 pack years      Review of Systems  Constitutional: Positive for fatigue. Negative for chills and fever.  HENT: Negative for postnasal drip, rhinorrhea and sinus pressure.   Respiratory: Positive for cough and shortness of breath. Negative for wheezing.   Cardiovascular: Positive for chest pain. Negative for palpitations and leg swelling.       Objective:   Physical Exam Vitals:   07/24/17 1339  BP: 128/76  Pulse: (!) 109  SpO2: 92%  Weight: 148 lb (67.1 kg)  Height: '5\' 4"'$  (1.626 m)   RA  Gen: chronically ill appearing HENT: OP poor dentition, TM's clear, neck supple PULM: CTA B, normal percussion CV: RRR, no mgr, trace edema GI: BS+, soft, nontender Derm: no cyanosis or rash Psyche: normal mood and affect   Echo Echo 04/2012:  Normal EF, LVH with diastolic dysfunction and elevated LV end-diastolic pressure, mildly dilated LA, mild decrease in RV function, PA peak pressure 1m. 03/2017 LVEF 55-60% PA pressure 563mg   RHC RHC 2014:  Mean PA 35, PCWP 15, Fick CO 4.2, PVR 4-5  ONO ONO RA 2014: desat to 80% >>  started on nocturnal oxygen, later refused to use  PFT PFT's 08/2012:  FEV1 2.11 (75%), ratio 66, +airtrapping, no restriction, DLCO 38%. September 2016 pulmonary function testing ratio 67%, FEV1 1.86 L (67% predicted), total lung capacity 6.03 L (112% predicted), DLCO 7.97 (29% predicted), residual volume 3.34 L (159% predicted).   6MW: 01/2016 6MW 132m O2 saturation 90% on 3L [place] 03/2017 6 min walk test was 288 m , on 2l/m O2.      BMET    Component Value Date/Time   NA 135 08/04/2016 1622   K 4.0 08/04/2016 1622   CL 99 (L) 08/04/2016 1622   CO2 29 08/04/2016 1622   GLUCOSE 115 (H) 08/04/2016 1622   BUN 19 08/04/2016 1622   CREATININE 1.00 08/04/2016 1622   CREATININE 1.09 04/02/2012 1636   CALCIUM 9.3 08/04/2016  1622   GFRNONAA 59 (L) 08/04/2016 1622   GFRAA >60 08/04/2016 1622        Assessment & Plan:  Therapeutic drug monitoring - Plan: B Nat Peptide, Comp Met (CMET)  Pulmonary hypertension (HCC)  Other emphysema (HCC)  Chronic respiratory failure with hypoxia (HCC)  Tobacco use disorder  Discussion: This is been a stable interval for Kathleen Burns.  However unfortunately she continues to smoke cigarettes and is now smoking more than she did a year ago.  Despite this she has not had a decline in symptoms.  She is noncompliant with oxygen right now.  I counseled her today at length that this is likely going to cause her develop cor pulmonale if she does not start using oxygen again.  She has had a bit of orthostasis lately, I think this is predominantly due to pulmonary hypertension in the setting of noncompliance with oxygen.  Her weight is down some going to encourage her to drink a bit more water.  I counseled her at length today on the need to quit smoking.  Plan: COPD: Continue Breo Continue Spiriva Continue as needed albuterol  Tobacco use: Stop smoking right away Reviewed the smoking cessation sheet we gave you  Chronic respiratory failure with hypoxemia: We will check your oxygen level as you are ambulating today in the office 6-minute walk on the next visit Use oxygen while walking and while sleeping  Pulmonary hypertension: Continue Letaris  Continue Adcirca Check liver function test today Check basic metabolic panel today Weigh daily, your goal weight is 145 Use Lasix as needed for leg swelling or if your weight increases more than 3 pounds from baseline  We will see you back in 3 months with a 6-minute walk or sooner if needed    Current Outpatient Medications:  .  albuterol (PROAIR HFA) 108 (90 BASE) MCG/ACT inhaler, Inhale 2 puffs into the lungs 4 (four) times daily as needed. For shortness of breath , Disp: , Rfl:  .  buPROPion (WELLBUTRIN SR) 150 MG 12 hr  tablet, Take 1 tab daily X3 days, then increase to 1 tab BID, Disp: 60 tablet, Rfl: 5 .  cetirizine (ZYRTEC) 10 MG tablet, Take 10 mg by mouth as needed for allergies. , Disp: , Rfl:  .  diphenhydramine-acetaminophen (TYLENOL PM) 25-500 MG TABS tablet, Take 2 tablets by mouth at bedtime as needed (SLEEP)., Disp: , Rfl:  .  fluticasone furoate-vilanterol (BREO ELLIPTA) 100-25 MCG/INH AEPB, Inhale 1 puff into the lungs daily., Disp: 90 each, Rfl: 3 .  furosemide (LASIX) 40 MG tablet, Take 1 tablet (40 mg total) by mouth daily as needed (if your weight is over  189 lbs)., Disp: 30 tablet, Rfl: 5 .  LETAIRIS 5 MG tablet, TAKE 1 TABLET BY MOUTH EVERY DAY, Disp: 90 tablet, Rfl: 0 .  LORazepam (ATIVAN) 1 MG tablet, Take 1 mg by mouth every 8 (eight) hours.  , Disp: , Rfl:  .  metoprolol tartrate (LOPRESSOR) 25 MG tablet, Take 0.5 tablets (12.5 mg total) by mouth 2 (two) times daily., Disp: 90 tablet, Rfl: 3 .  montelukast (SINGULAIR) 10 MG tablet, Take 10 mg by mouth at bedtime., Disp: , Rfl:  .  Morphine-Naltrexone 50-2 MG CPCR, Take 1 capsule by mouth 2 (two) times daily., Disp: , Rfl:  .  nitroGLYCERIN (NITROSTAT) 0.4 MG SL tablet, Place 1 tablet (0.4 mg total) under the tongue every 5 (five) minutes as needed for chest pain., Disp: 25 tablet, Rfl: 3 .  omeprazole (PRILOSEC) 40 MG capsule, TAKE 1 CAPSULE BY MOUTH TWICE A DAY., Disp: 180 capsule, Rfl: 3 .  ranolazine (RANEXA) 500 MG 12 hr tablet, Take 1 tablet (500 mg total) by mouth 2 (two) times daily., Disp: 60 tablet, Rfl: 6 .  tadalafil, PAH, (ADCIRCA) 20 MG tablet, Take 1 tablet (20 mg total) by mouth daily., Disp: 30 tablet, Rfl: 11 .  traZODone (DESYREL) 50 MG tablet, Take 50 mg by mouth at bedtime., Disp: , Rfl:  .  umeclidinium bromide (INCRUSE ELLIPTA) 62.5 MCG/INH AEPB, Inhale 1 puff into the lungs daily., Disp: 90 each, Rfl: 1

## 2017-07-24 NOTE — Patient Instructions (Signed)
COPD: Continue Breo Continue Spiriva Continue as needed albuterol  Tobacco use: Stop smoking right away Reviewed the smoking cessation sheet we gave you  Chronic respiratory failure with hypoxemia: We will check your oxygen level as you are ambulating today in the office 6-minute walk on the next visit Use oxygen while walking and while sleeping  Pulmonary hypertension: Continue Letaris  Continue Adcirca Check liver function test today Check basic metabolic panel today Weigh daily, your goal weight is 145 Use Lasix as needed for leg swelling or if your weight increases more than 3 pounds from baseline  We will see you back in 3 months with a 6-minute walk or sooner if needed

## 2017-07-27 ENCOUNTER — Ambulatory Visit (INDEPENDENT_AMBULATORY_CARE_PROVIDER_SITE_OTHER)
Admission: RE | Admit: 2017-07-27 | Discharge: 2017-07-27 | Disposition: A | Discharge status: Home / Self Care | Payer: 59 | Source: Ambulatory Visit | Attending: Acute Care | Admitting: Acute Care

## 2017-07-27 DIAGNOSIS — R918 Other nonspecific abnormal finding of lung field: Secondary | ICD-10-CM

## 2017-07-27 DIAGNOSIS — F17210 Nicotine dependence, cigarettes, uncomplicated: Secondary | ICD-10-CM

## 2017-07-31 ENCOUNTER — Other Ambulatory Visit: Payer: Self-pay | Admitting: Acute Care

## 2017-07-31 DIAGNOSIS — Z122 Encounter for screening for malignant neoplasm of respiratory organs: Secondary | ICD-10-CM

## 2017-07-31 DIAGNOSIS — F17210 Nicotine dependence, cigarettes, uncomplicated: Principal | ICD-10-CM

## 2017-08-21 ENCOUNTER — Encounter: Payer: Self-pay | Admitting: Neurology

## 2017-09-09 ENCOUNTER — Encounter: Payer: Self-pay | Admitting: Neurology

## 2017-09-09 ENCOUNTER — Ambulatory Visit (INDEPENDENT_AMBULATORY_CARE_PROVIDER_SITE_OTHER): Payer: 59 | Admitting: Neurology

## 2017-09-09 VITALS — BP 124/68 | HR 82 | Resp 16 | Ht 64.5 in | Wt 149.2 lb

## 2017-09-09 DIAGNOSIS — H81391 Other peripheral vertigo, right ear: Secondary | ICD-10-CM

## 2017-09-09 DIAGNOSIS — M542 Cervicalgia: Secondary | ICD-10-CM | POA: Diagnosis not present

## 2017-09-09 DIAGNOSIS — Z720 Tobacco use: Secondary | ICD-10-CM

## 2017-09-09 NOTE — Patient Instructions (Addendum)
I think the dizziness is from the ear.   I will refer you to ear, nose and throat.  If they think it is not the ear, then I will see you back and we can pursue other testing. Follow up as needed
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
## 2017-09-11 IMAGING — DX DG CHEST 2V
2 series · 2 of 2 positions shown · non-contrast
Comparison: 11/06/2015

CLINICAL DATA: Preoperative evaluation, history COPD, pulmonary
hypertension, diabetes mellitus, breast cancer, smoker,
hypertension, hiatal hernia

EXAM:
CHEST  2 VIEW

[chest pa]
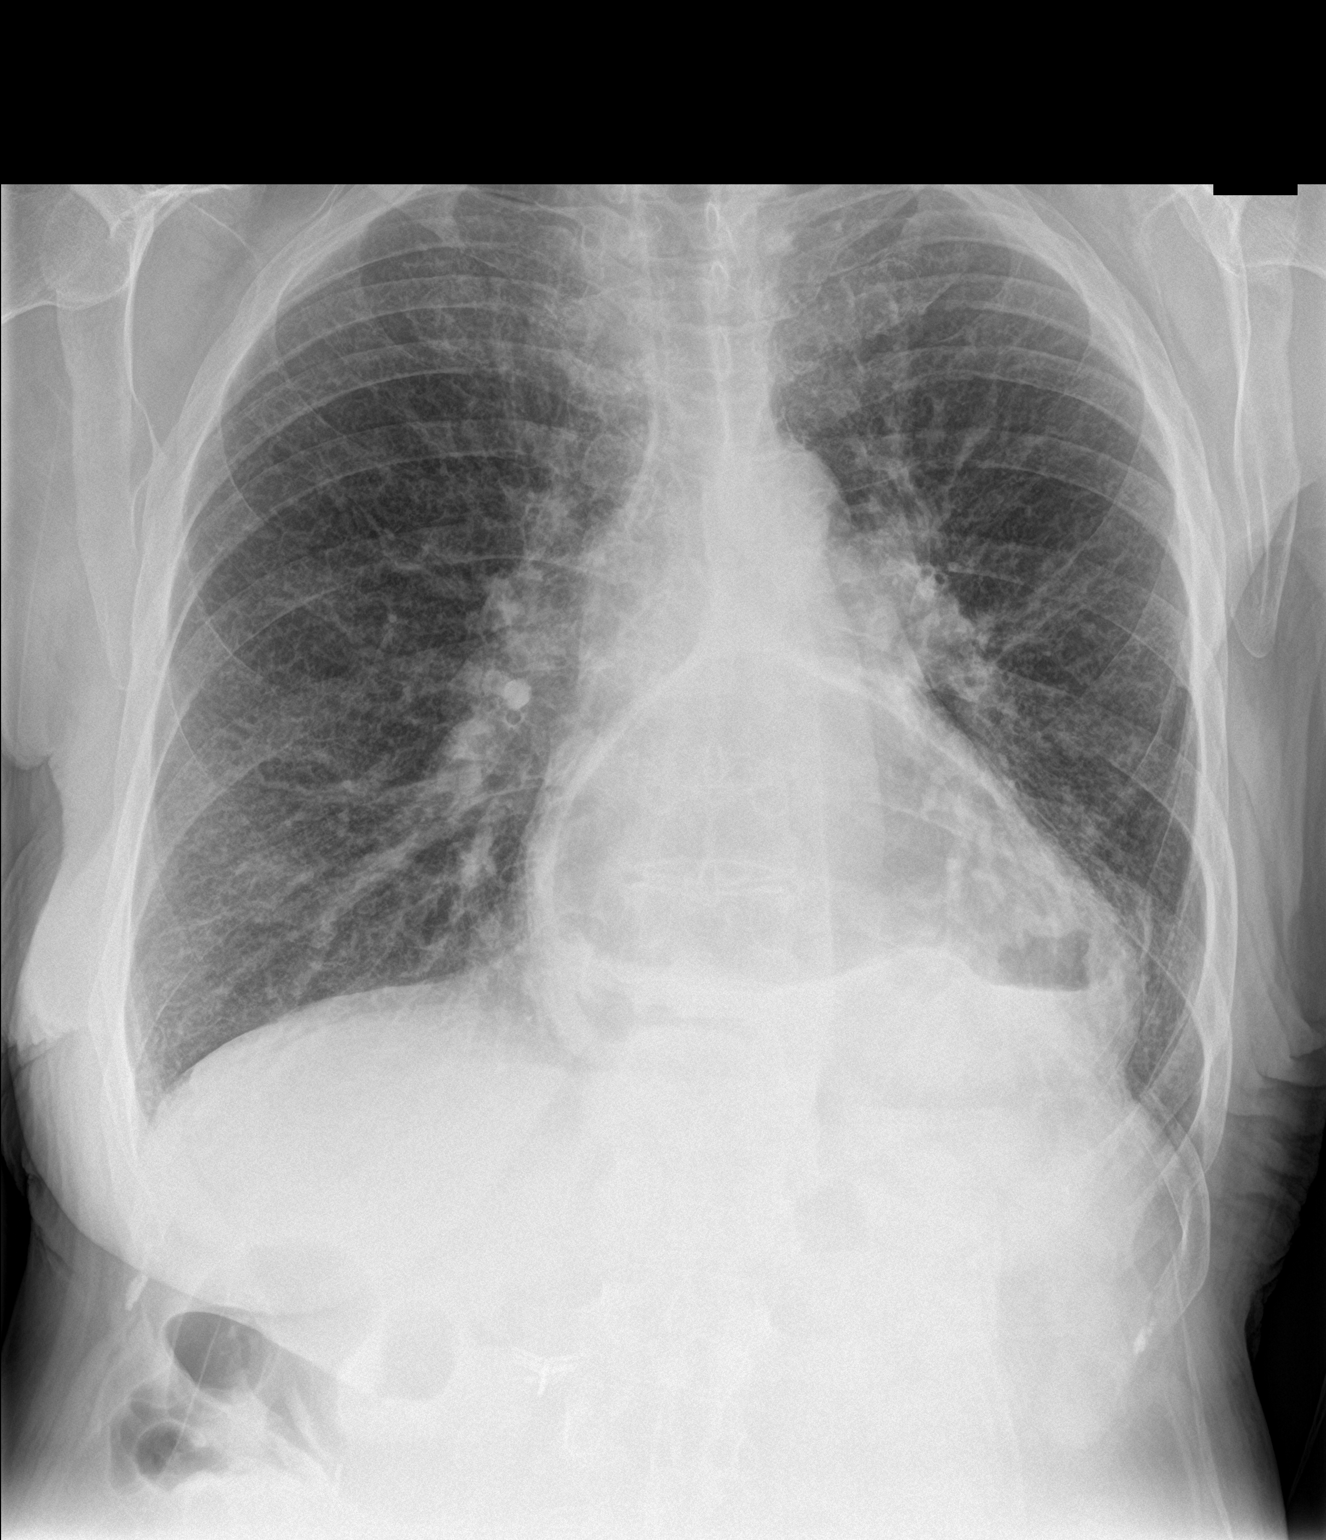

[chest lat]
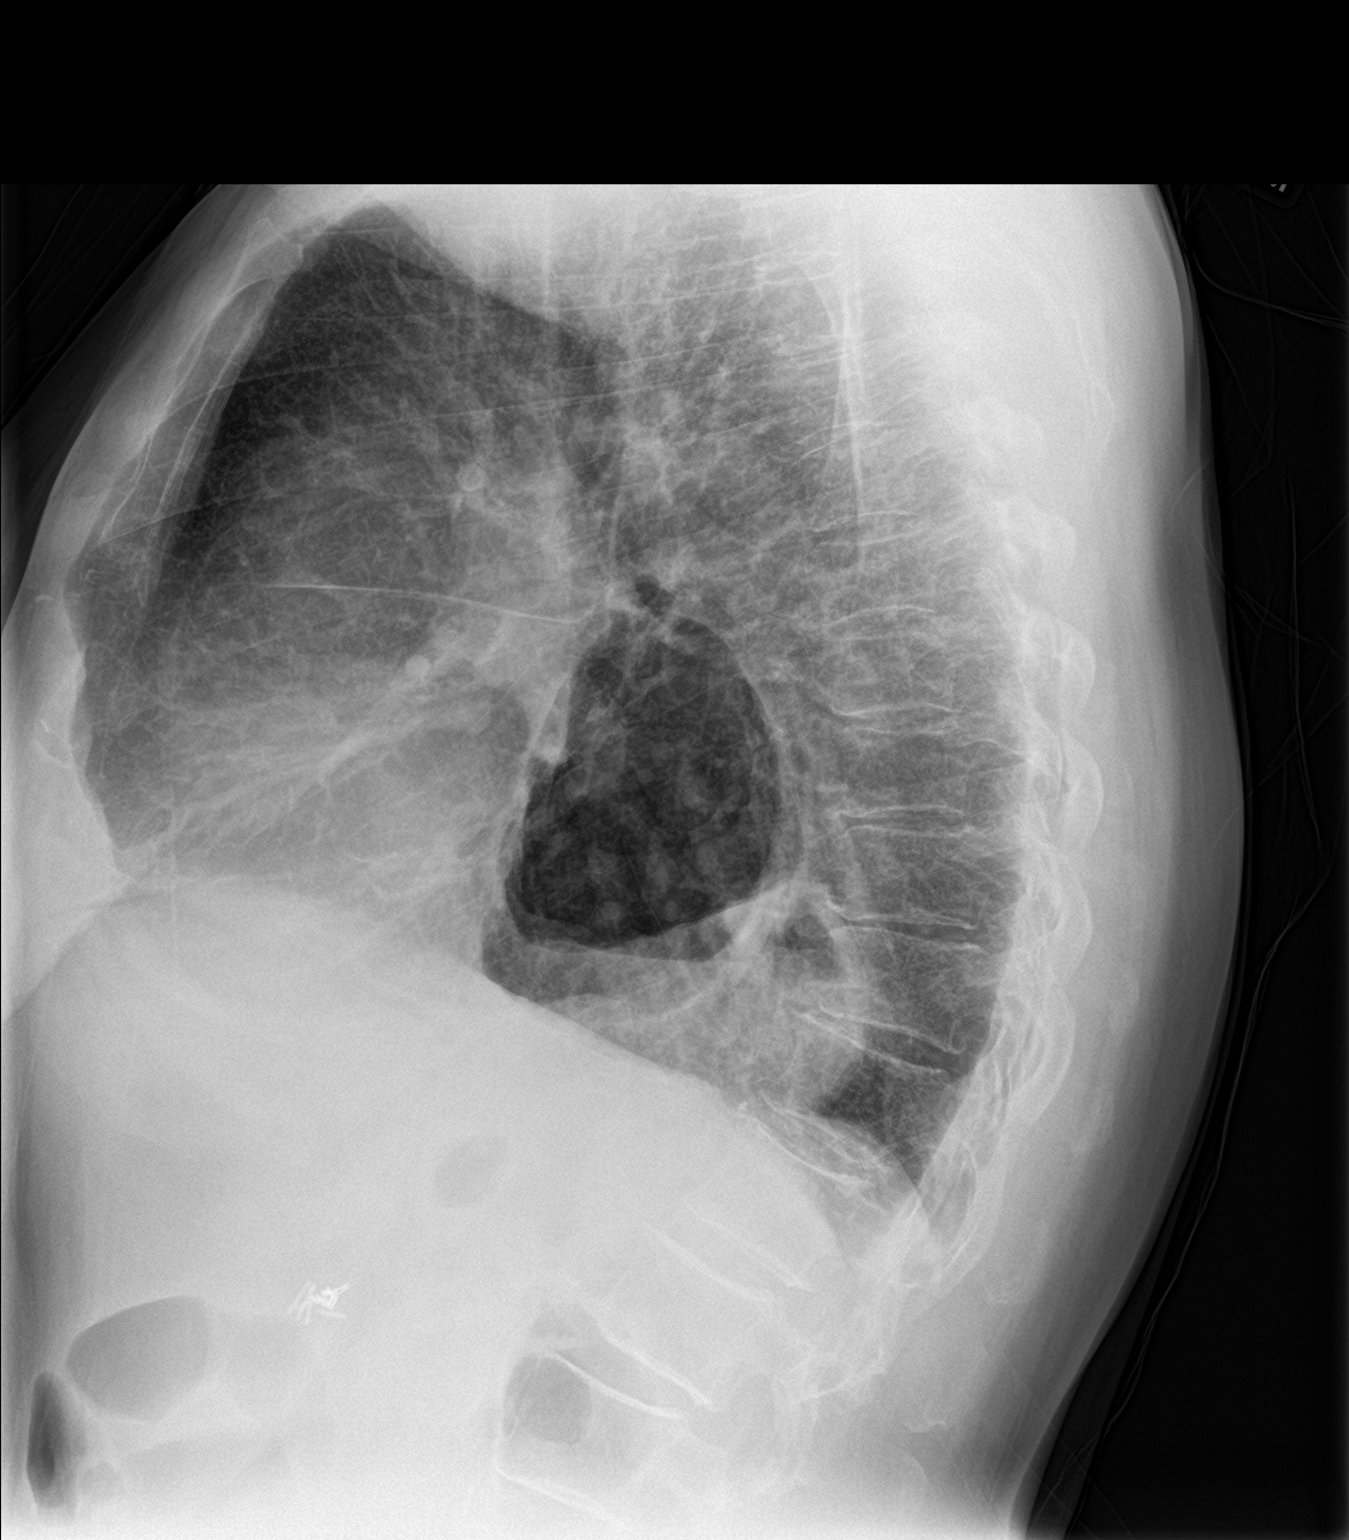

[2 of 2 positions shown; findings below may reference images not displayed]

FINDINGS: Enlargement of cardiac silhouette.

Pulmonary vascularity normal.

Large hiatal hernia.

Emphysematous and bronchitic changes consistent with history of
COPD.

LEFT basilar atelectasis.

No acute infiltrate, pleural effusion or pneumothorax.

Bones demineralized.
IMPRESSION: COPD changes with LEFT basilar atelectasis.

Large hiatal hernia.

Mild enlargement of cardiac silhouette.

## 2017-09-17 ENCOUNTER — Encounter: Payer: Self-pay | Admitting: Neurology

## 2017-09-18 ENCOUNTER — Other Ambulatory Visit: Payer: Self-pay

## 2017-09-18 MED ORDER — MECLIZINE HCL 25 MG PO TABS: 25.0000 mg | ORAL_TABLET | Freq: Three times a day (TID) | ORAL | 1 refills | Status: DC | PRN

## 2017-09-24 ENCOUNTER — Telehealth: Payer: Self-pay | Admitting: Neurology

## 2017-09-24 NOTE — Telephone Encounter (Signed)
Patient states that she has not heard anything from the ENT office and she wants to talk to some one about the status of the referral

## 2017-09-24 NOTE — Telephone Encounter (Signed)
Called and spoke with Pt, advsd her I had called Dr Thornell Mule' office and they are booking out to Aug. I spoke with Dr Tomi Likens, he mentioned Dr Minna Merritts as an alternative, I called [facility] office and their first available is April 4th. Pt wants referral to [facility] office. Faxed referral.

## 2017-10-01 ENCOUNTER — Telehealth: Payer: Self-pay | Admitting: Neurology

## 2017-10-01 NOTE — Telephone Encounter (Signed)
Called Dr Berle Mull office, was closed. Called and spoke with Pamala Hurry, offered her the number to call [facility] office in the morning after 8:15A, she was driving and unable to write it down.

## 2017-10-01 NOTE — Telephone Encounter (Signed)
Pamala Hurry called regarding Pt and that Dr Tomi Likens had wanted pt to see an ENT but pt has not been contacted yet and would like a call back regarding this

## 2017-10-02 ENCOUNTER — Encounter: Payer: Self-pay | Admitting: *Deleted

## 2017-10-05 ENCOUNTER — Other Ambulatory Visit: Payer: Self-pay | Admitting: Otolaryngology

## 2017-10-05 ENCOUNTER — Ambulatory Visit
Admission: RE | Admit: 2017-10-05 | Discharge: 2017-10-05 | Disposition: A | Discharge status: Home / Self Care | Payer: 59 | Source: Ambulatory Visit | Attending: Otolaryngology | Admitting: Otolaryngology

## 2017-10-05 DIAGNOSIS — Z87891 Personal history of nicotine dependence: Secondary | ICD-10-CM

## 2017-10-07 ENCOUNTER — Other Ambulatory Visit (HOSPITAL_COMMUNITY): Payer: Self-pay | Admitting: Otolaryngology

## 2017-10-07 DIAGNOSIS — H9041 Sensorineural hearing loss, unilateral, right ear, with unrestricted hearing on the contralateral side: Secondary | ICD-10-CM

## 2017-10-07 DIAGNOSIS — H8110 Benign paroxysmal vertigo, unspecified ear: Secondary | ICD-10-CM

## 2017-10-07 DIAGNOSIS — B370 Candidal stomatitis: Secondary | ICD-10-CM

## 2017-10-07 DIAGNOSIS — IMO0001 Reserved for inherently not codable concepts without codable children: Secondary | ICD-10-CM

## 2017-10-09 ENCOUNTER — Ambulatory Visit (INDEPENDENT_AMBULATORY_CARE_PROVIDER_SITE_OTHER): Payer: 59 | Admitting: *Deleted

## 2017-10-09 ENCOUNTER — Encounter: Payer: Self-pay | Admitting: Pulmonary Disease

## 2017-10-09 ENCOUNTER — Other Ambulatory Visit (INDEPENDENT_AMBULATORY_CARE_PROVIDER_SITE_OTHER): Payer: 59

## 2017-10-09 ENCOUNTER — Ambulatory Visit: Payer: 59

## 2017-10-09 ENCOUNTER — Ambulatory Visit (INDEPENDENT_AMBULATORY_CARE_PROVIDER_SITE_OTHER): Payer: 59 | Admitting: Pulmonary Disease

## 2017-10-09 VITALS — BP 128/66 | HR 107 | Ht 64.0 in | Wt 150.0 lb

## 2017-10-09 DIAGNOSIS — I272 Pulmonary hypertension, unspecified: Secondary | ICD-10-CM

## 2017-10-09 DIAGNOSIS — Z5181 Encounter for therapeutic drug level monitoring: Secondary | ICD-10-CM

## 2017-10-09 DIAGNOSIS — J438 Other emphysema: Secondary | ICD-10-CM | POA: Diagnosis not present

## 2017-10-09 DIAGNOSIS — J449 Chronic obstructive pulmonary disease, unspecified: Secondary | ICD-10-CM | POA: Diagnosis not present

## 2017-10-09 DIAGNOSIS — Z9981 Dependence on supplemental oxygen: Secondary | ICD-10-CM

## 2017-10-09 DIAGNOSIS — F17210 Nicotine dependence, cigarettes, uncomplicated: Secondary | ICD-10-CM | POA: Diagnosis not present

## 2017-10-09 LAB — BRAIN NATRIURETIC PEPTIDE: Pro B Natriuretic peptide (BNP): 111 pg/mL — ABNORMAL HIGH (ref 0.0–100.0)

## 2017-10-09 LAB — COMPREHENSIVE METABOLIC PANEL
ALT: 3 U/L (ref 0–35)
AST: 7 U/L (ref 0–37)
Albumin: 4.1 g/dL (ref 3.5–5.2)
Alkaline Phosphatase: 57 U/L (ref 39–117)
BUN: 20 mg/dL (ref 6–23)
CALCIUM: 9.5 mg/dL (ref 8.4–10.5)
CO2: 26 meq/L (ref 19–32)
Chloride: 102 mEq/L (ref 96–112)
Creatinine, Ser: 0.98 mg/dL (ref 0.40–1.20)
GFR: 60.85 mL/min (ref >60.00)
Glucose, Bld: 108 mg/dL — ABNORMAL HIGH (ref 70–99)
Potassium: 4.5 mEq/L (ref 3.5–5.1)
Sodium: 135 mEq/L (ref 135–145)
Total Bilirubin: 0.5 mg/dL (ref 0.2–1.2)
Total Protein: 7.7 g/dL (ref 6.0–8.3)

## 2017-10-09 NOTE — Progress Notes (Signed)
SIX MIN WALK 10/09/2017 04/20/2017 09/11/2016 02/27/2016 11/06/2015 11/06/2015 11/06/2015  Medications when asked pt could not recall names of the medications she took, stating it was a bad day at 3:30 am   pt took the following medications---lopressor, morphine, omeprazole, ranexa Metoprolol 12.5mg , Morphine 50-2mg , Ranexa 500mg - taken approx 4:00 am.  Norco, Omeprazole, Morphine, Ranexa - - -  Supplimental Oxygen during Test? (L/min) Yes Yes No Yes Yes Yes No  O2 Flow Rate 4 2 - 3 3 2  -  Type Pulse Continuous - Continuous Continuous Continuous -  Laps 5 6 5 3  - - -  Partial Lap (in Meters) 36 0 0 0 - - -  Baseline BP (sitting) 122/80 126/80 126/64 100/88 - - -  Baseline Heartrate 101 98 99 101 - - -  Baseline Dyspnea (Borg Scale) 3 0 4 0 - - -  Baseline Fatigue (Borg Scale) 3 3 4 6  - - -  Baseline SPO2 91 92 92 94 - - -  BP (sitting) 132/78 140/82 136/78 112/92 - - -  Heartrate 115 110 114 118 - - -  Dyspnea (Borg Scale) 3 3 7 8  - - -  Fatigue (Borg Scale) 4 4 7 8  - - -  SPO2 89 92 80 90 - - -  BP (sitting) 118/78 136/80 116/62 102/82 - - -  Heartrate 111 104 105 106 - - -  SPO2 93 96 92 94 - - -  Stopped or Paused before Six Minutes No Yes Yes Yes - - -  Other Symptoms at end of Exercise - clock at 4:47 (at 57 meters) pt dropped to 87% on room air and pulse at 103--pt placed on 2 liters at this time.  pt stopped the test with the clock at 33 seconds left due to severe fatigue.   Patient stopped 4:05 in to walk d/t dizziness.  Pt noted dizziness throughout walk, was holding on to walls.  I advised pt to stop test d/t her presentation of worsening dizziness.   Patient stopped with 2 minutes and 55 seconds on the timer. Stated that she could not finish the test due to her "multiple disabilities." - - -  Interpretation - Dizziness;Hip pain Dizziness - - - -  Distance Completed 276 288 240 144 - - -  Tech Comments: Pateint walked on 4L pulse without stopping, gait was off but watched closely, no other  complications TA/CMA pt stated that she did experience left hip pain due to a bad back and some dizziness that is usual for her.  Pt walked a slow to moderate pace, notable dizziness during test.  Pt stopped test with 1:55 left in test d/t worsening dizziness.  - - did not complete laps 2 and 3 due to 02 desaturation did not complete lap 1 due to 02 desaturation

## 2017-10-09 NOTE — Progress Notes (Signed)
Subjective:    Patient ID: Kathleen Burns, female    DOB: June 13, 1956, 62 y.o.   MRN: [id number]  Synopsis: Former patient of Dr. Gwenette Burns with COPD and pulmonary hypertension;  She had a massive hiatal hernia which was taking up a significantin her thorax and in May 2018 she had surgical correction of this at [facility]. She had a laparoscopic gastropexy.  Started Letairis March 2017.   Started exertional oxygen again in summer 2017   HPI Chief Complaint  Patient presents with  . Follow-up    review 24m.  pt c/o stable sob, sinus congestion, intermittent dizziness.    Kathleen Burns is on an antibiotic right now for a sinus infection so she feels a little out of sorts.  She feels her breathing has been about the same lately.  She hasnot had any problems with the breathing worsening.  She is exercising more  Walking her dog in the yard.  No chest tightness, no wheezing.  No respiratory infections this year.    She is still smoking up to 2 packs in a day.  She is still using her BThomasena Burns Adcirca and Kathleen Burns  No problems with that.    She is still using and benefiting form her oxygen lately.   On room air at home her O2 saturation will be a little lower at night.     Past Medical History:  Diagnosis Date  . Anxiety and depression    H/o suicide attempt  . Breast cancer ([place]    Left mastectomy in 1990s + chemotherapy  . Chest pain    Cardiac catheterization in 1999, 2003, 2008-normal EF, normal coronaries  . COPD (chronic obstructive [place] disease) ([place]   . GERD (gastroesophageal reflux disease)   . Hearing impairment   . Hiatal hernia    large  . Hyperlipidemia   . Hypertension   . Meige disease (lymphedema praecox)    [facility]  . Obesity   . Osteoarthritis   . Pulmonary hypertension ([place]   . Skull fracture ([facility]   surgical repair in 1969  . Tobacco abuse    45 pack years      Review of  Systems  Constitutional: Positive for fatigue. Negative for chills and fever.  HENT: Negative for postnasal drip, rhinorrhea and sinus pressure.   Respiratory: Positive for cough and shortness of breath. Negative for wheezing.   Cardiovascular: Positive for chest pain. Negative for palpitations and leg swelling.       Objective:   Physical Exam Vitals:   10/09/17 1424  BP: 128/66  Pulse: (!) 107  SpO2: 92%  Weight: 150 lb (68 kg)  Height: '5\' 4"'$  (1.626 m)   RA  Gen: chronically ill appearing HENT: OP clear, TM's clear, neck supple PULM: CTA B, normal percussion CV: RRR, no mgr, trace edema GI: BS+, soft, nontender Derm: no cyanosis or rash Psyche: normal mood and affect  Chest imaging: 06/2017 Screening CT chest : RADS 2, stable adenopathy, mod gastric hernia, trace pericardial effusion  Echo Echo 04/2012:  Normal EF, LVH with diastolic dysfunction and elevated LV end-diastolic pressure, mildly dilated LA, mild decrease in RV function, PA peak pressure 671m 03/2017 LVEF 55-60% PA pressure 5736m   RHC RHC 2014:  Mean PA 35, PCWP 15, Fick CO 4.2, PVR 4-5  ONO ONO RA 2014: desat to 80% >> started on nocturnal oxygen, later refused to use  PFT PFT's 08/2012:  FEV1 2.11 (  75%), ratio 66, +airtrapping, no restriction, DLCO 38%. September 2016 pulmonary function testing ratio 67%, FEV1 1.86 L (67% predicted), total lung capacity 6.03 L (112% predicted), DLCO 7.97 (29% predicted), residual volume 3.34 L (159% predicted).   6MW: 01/2016 6MW 117m O2 saturation 90% on 3L [place] 03/2017 6 min walk test was 288 m , on 2l/m O2 continuous.  09/2017 6 min walk 2732m4L O2 pulse     BMET    Component Value Date/Time   NA 133 (L) 07/24/2017 1327   K 4.5 07/24/2017 1327   CL 99 07/24/2017 1327   CO2 27 07/24/2017 1327   GLUCOSE 98 07/24/2017 1327   BUN 26 (H) 07/24/2017 1327   CREATININE 1.03 07/24/2017 1327   CREATININE 1.09 04/02/2012 1636   CALCIUM 9.8 07/24/2017 1327    GFRNONAA 59 (L) 08/04/2016 1622   GFRAA >60 08/04/2016 1622        Assessment & Plan:  Therapeutic drug monitoring - Plan: Comp Met (CMET), B Nat Peptide  Pulmonary hypertension (HCC) - Plan: Comp Met (CMET), B Nat Peptide  Moderate smoker (20 or less per day)  Other emphysema (HCCapulin Oxygen dependent  Discussion: This has been a stable interval for Kathleen Burns.  Her 6-minute walk distance is stable today.  Unfortunately she continues to smoke cigarettes but despite that she has minimal airflow obstruction and emphysema and more prominent pulmonary hypertension findings.  I advised her again today that the best pulmonary vasodilator she can use is oxygen and so I reminded her to use it when walking.  Plan: For COPD: Continue Breo Continue Spiriva Use albuterol as needed for chest tightness wheezing or shortness of breath  Cigarette smoking: Stop smoking Follow-up with the lung cancer screening program CT scans, the next will be in January 2020  Chronic respiratory failure with hypoxemia: It is very important that she use 4 L of oxygen when you exert yourself  Pulmonary hypertension: Continue Adcirca and Letairis We will check lab work today to look for evidence of liver toxicity We will check a 6-minute walk in 6 months  I will plan on seeing him back in 3 months or sooner if needed.     Current Outpatient Medications:  .  albuterol (PROAIR HFA) 108 (90 BASE) MCG/ACT inhaler, Inhale 2 puffs into the lungs 4 (four) times daily as needed. For shortness of breath , Disp: , Rfl:  .  azithromycin (ZITHROMAX) 500 MG tablet, Take 500 mg by mouth daily., Disp: , Rfl:  .  buPROPion (WELLBUTRIN SR) 150 MG 12 hr tablet, Take 1 tab daily X3 days, then increase to 1 tab BID, Disp: 60 tablet, Rfl: 5 .  cetirizine (ZYRTEC) 10 MG tablet, Take 10 mg by mouth as needed for allergies. , Disp: , Rfl:  .  diphenhydramine-acetaminophen (TYLENOL PM) 25-500 MG TABS tablet, Take 2 tablets by mouth at  bedtime as needed (SLEEP)., Disp: , Rfl:  .  fluconazole (DIFLUCAN) 100 MG tablet, Take 100 mg by mouth daily., Disp: , Rfl:  .  fluticasone furoate-vilanterol (BREO ELLIPTA) 100-25 MCG/INH AEPB, Inhale 1 puff into the lungs daily., Disp: 90 each, Rfl: 3 .  furosemide (LASIX) 40 MG tablet, Take 1 tablet (40 mg total) by mouth daily as needed (if your weight is over 189 lbs)., Disp: 30 tablet, Rfl: 5 .  LETAIRIS 5 MG tablet, TAKE 1 TABLET BY MOUTH EVERY DAY, Disp: 90 tablet, Rfl: 0 .  LORazepam (ATIVAN) 1 MG tablet, Take 1 mg by mouth  every 8 (eight) hours.  , Disp: , Rfl:  .  meclizine (ANTIVERT) 25 MG tablet, Take 1 tablet (25 mg total) by mouth 3 (three) times daily as needed for dizziness., Disp: 30 tablet, Rfl: 1 .  metoprolol tartrate (LOPRESSOR) 25 MG tablet, Take 0.5 tablets (12.5 mg total) by mouth 2 (two) times daily., Disp: 90 tablet, Rfl: 3 .  montelukast (SINGULAIR) 10 MG tablet, Take 10 mg by mouth at bedtime., Disp: , Rfl:  .  Morphine-Naltrexone 50-2 MG CPCR, Take 1 capsule by mouth 2 (two) times daily., Disp: , Rfl:  .  nitroGLYCERIN (NITROSTAT) 0.4 MG SL tablet, Place 1 tablet (0.4 mg total) under the tongue every 5 (five) minutes as needed for chest pain., Disp: 25 tablet, Rfl: 3 .  omeprazole (PRILOSEC) 40 MG capsule, TAKE 1 CAPSULE BY MOUTH TWICE A DAY., Disp: 180 capsule, Rfl: 3 .  ranolazine (RANEXA) 500 MG 12 hr tablet, Take 1 tablet (500 mg total) by mouth 2 (two) times daily., Disp: 60 tablet, Rfl: 6 .  tadalafil, PAH, (ADCIRCA) 20 MG tablet, Take 1 tablet (20 mg total) by mouth daily., Disp: 30 tablet, Rfl: 11 .  traZODone (DESYREL) 50 MG tablet, Take 50 mg by mouth at bedtime., Disp: , Rfl:  .  umeclidinium bromide (INCRUSE ELLIPTA) 62.5 MCG/INH AEPB, Inhale 1 puff into the lungs daily., Disp: 90 each, Rfl: 1

## 2017-10-09 NOTE — Patient Instructions (Signed)
For COPD: Continue Breo Continue Spiriva Use albuterol as needed for chest tightness wheezing or shortness of breath  Cigarette smoking: Stop smoking Follow-up with the lung cancer screening program CT scans, the next will be in January 2020  Chronic respiratory failure with hypoxemia: It is very important that she use 4 L of oxygen when you exert yourself  Pulmonary hypertension: Continue Adcirca and Letairis We will check lab work today to look for evidence of liver toxicity We will check a 6-minute walk in 6 months  I will plan on seeing him back in 3 months or sooner if needed.

## 2017-10-13 ENCOUNTER — Ambulatory Visit (HOSPITAL_COMMUNITY)
Admission: RE | Admit: 2017-10-13 | Discharge: 2017-10-13 | Disposition: A | Discharge status: Home / Self Care | Payer: 59 | Source: Ambulatory Visit | Attending: Otolaryngology | Admitting: Otolaryngology

## 2017-10-13 DIAGNOSIS — I6782 Cerebral ischemia: Secondary | ICD-10-CM | POA: Insufficient documentation

## 2017-10-13 DIAGNOSIS — IMO0001 Reserved for inherently not codable concepts without codable children: Secondary | ICD-10-CM

## 2017-10-13 DIAGNOSIS — H8110 Benign paroxysmal vertigo, unspecified ear: Secondary | ICD-10-CM | POA: Diagnosis not present

## 2017-10-13 DIAGNOSIS — H9041 Sensorineural hearing loss, unilateral, right ear, with unrestricted hearing on the contralateral side: Secondary | ICD-10-CM | POA: Diagnosis present

## 2017-10-13 DIAGNOSIS — B370 Candidal stomatitis: Secondary | ICD-10-CM | POA: Diagnosis not present

## 2017-10-13 MED ORDER — GADOBENATE DIMEGLUMINE 529 MG/ML IV SOLN
15.0000 mL | Freq: Once | INTRAVENOUS | Status: AC | PRN
  Administered 2017-10-13: 14 mL via INTRAVENOUS

## 2017-10-18 ENCOUNTER — Other Ambulatory Visit: Payer: Self-pay | Admitting: Pulmonary Disease

## 2017-11-16 ENCOUNTER — Other Ambulatory Visit (HOSPITAL_COMMUNITY): Payer: Self-pay | Admitting: Family

## 2017-11-16 DIAGNOSIS — N644 Mastodynia: Secondary | ICD-10-CM

## 2017-12-01 ENCOUNTER — Encounter (HOSPITAL_COMMUNITY): Payer: 59

## 2017-12-22 ENCOUNTER — Encounter (HOSPITAL_COMMUNITY): Payer: 59

## 2017-12-22 ENCOUNTER — Encounter (HOSPITAL_COMMUNITY): Payer: Self-pay

## 2017-12-30 ENCOUNTER — Telehealth: Payer: Self-pay | Admitting: Pulmonary Disease

## 2017-12-30 NOTE — Telephone Encounter (Signed)
Attempted to call patient regarding O2 to be d/c and meds to be stopped. I did not receive an answer at time of call. I have left a voicemail message for pt to return call. X1

## 2018-01-01 NOTE — Telephone Encounter (Signed)
None of this sounds good Can she come see me or someone in the office? I don't want to stop any medicines or oxygen

## 2018-01-01 NOTE — Telephone Encounter (Signed)
Spoke with pt. She is very distraught and upset. Her roommate is her sole source for helping her with everyday things. I offered the pt an appointment for today and she states that she can't because she has no one to bring her. Pt is going to see if anyone can bring her next week. States that she will call on Monday for an appointment.

## 2018-01-01 NOTE — Telephone Encounter (Signed)
Spoke with patient. She stated that she was calling to discontinue her O2 and treatment due to uncertainty in her life at this moment. Her roommate, also a BQ patient Dreama Saa) is in the hospital and will be going to an nursing facility when she is discharged. Patient currently does not have transportation and is not sure of when she will be able to see BQ again.   Advised patient that we could send in a referral to a pulmonologist in her new area. She stated that she does not trust anyone else but BQ with her health.   Advised patient that I could call [employer] to see if she could keep her O2 until everything calms down. She has not received any calls from [facility] stating they were take her O2. She verbalized understanding.   Spoke with Estill Bamberg , she stated that the patient is not due for a follow up for insurance purposes for her O2.   Will route to BQ is he is aware.

## 2018-01-11 ENCOUNTER — Telehealth: Payer: Self-pay | Admitting: Pulmonary Disease

## 2018-01-11 NOTE — Telephone Encounter (Signed)
Attempted to call Thurmond Butts back but the office is now closed, so will try to call back tomorrow.  Attempted to call the patient, no answer, left a voicemail to call back.   BQ the [employer] program is requesting an email address from the provider. Is it okay to use yours or would you prefer for me to give mine?

## 2018-01-12 ENCOUNTER — Ambulatory Visit: Payer: 59 | Admitting: Pulmonary Disease

## 2018-01-12 NOTE — Telephone Encounter (Signed)
OK to give mine

## 2018-01-12 NOTE — Telephone Encounter (Signed)
I am going to route this message to Burman Nieves so that she may update this form.

## 2018-01-13 NOTE — Telephone Encounter (Signed)
Re-faxed over MD form with Dr. Anastasia Pall email address. Called and spoke to someone and they will send a form to patient to obtain her signature.

## 2018-01-15 NOTE — Telephone Encounter (Signed)
Attempted to call patient today regarding if she rec'd form from [employer] program to sign a, date and complete at this time. . I did not receive an answer at time of call. I have left a voicemail message for pt to return call. X1

## 2018-01-18 NOTE — Telephone Encounter (Signed)
Left message for patient-want to confirm that she has rec'd her packet of information to complete and return to the company.

## 2018-01-19 NOTE — Telephone Encounter (Signed)
Attempted to call patient today regarding if she rec'd form from [employer] program to sign a, date and complete at this time. I did not receive an answer at time of call. I have left a voicemail message forptto return call. X3

## 2018-01-21 NOTE — Telephone Encounter (Signed)
Attempted to call patient today regardingif she rec'd form from[employer]programto sign a, date and complete at this time. I did not receive an answer at time of call. I have left a voicemail message forptto return call. X4 Mailed letter in mail today for pt to contact office

## 2018-01-28 ENCOUNTER — Telehealth: Payer: Self-pay | Admitting: Pulmonary Disease

## 2018-01-28 NOTE — Telephone Encounter (Signed)
Spoke with the pt  She states that she received the [employer] form in the mail  She wondered if BQ wanted her to stay on the medications letairis and adcirca  She was not sure if she should bother filling out the form and sending it back to [employer]  I advised that she should continue on her current tx plan that was discussed at last visit She states that she will fill out form to the best of her ability and mail back  I asked her to please let us know if she needs our help with this  She has been having issues with transportation and this is why she cancelled last ov  I have scheduled her to see BQ on 02/25/18 at 10:45 and asked her to call sooner if needed  She verbalized understanding

## 2018-02-16 ENCOUNTER — Ambulatory Visit: Payer: 59 | Admitting: Pulmonary Disease

## 2018-02-25 ENCOUNTER — Ambulatory Visit: Payer: 59 | Admitting: Pulmonary Disease

## 2018-05-19 ENCOUNTER — Other Ambulatory Visit: Payer: Self-pay

## 2018-05-19 ENCOUNTER — Other Ambulatory Visit: Payer: Self-pay | Admitting: Pulmonary Disease

## 2018-05-19 MED ORDER — METOPROLOL TARTRATE 25 MG PO TABS: 12.5000 mg | ORAL_TABLET | Freq: Two times a day (BID) | ORAL | 3 refills | Status: DC

## 2018-05-19 NOTE — Telephone Encounter (Signed)
Pt is PRN, refilled metoprolol

## 2018-05-24 ENCOUNTER — Encounter (HOSPITAL_COMMUNITY): Payer: Self-pay | Admitting: Emergency Medicine

## 2018-05-24 ENCOUNTER — Emergency Department (HOSPITAL_COMMUNITY): Payer: 59

## 2018-05-24 ENCOUNTER — Emergency Department (HOSPITAL_COMMUNITY)
Admission: EM | Admit: 2018-05-24 | Discharge: 2018-05-24 | Disposition: A | Discharge status: Home / Self Care | Payer: 59 | Attending: Emergency Medicine | Admitting: Emergency Medicine

## 2018-05-24 DIAGNOSIS — I110 Hypertensive heart disease with heart failure: Secondary | ICD-10-CM | POA: Diagnosis not present

## 2018-05-24 DIAGNOSIS — Z853 Personal history of malignant neoplasm of breast: Secondary | ICD-10-CM | POA: Insufficient documentation

## 2018-05-24 DIAGNOSIS — R197 Diarrhea, unspecified: Secondary | ICD-10-CM | POA: Diagnosis not present

## 2018-05-24 DIAGNOSIS — R531 Weakness: Secondary | ICD-10-CM | POA: Diagnosis present

## 2018-05-24 DIAGNOSIS — I5032 Chronic diastolic (congestive) heart failure: Secondary | ICD-10-CM | POA: Insufficient documentation

## 2018-05-24 DIAGNOSIS — F17210 Nicotine dependence, cigarettes, uncomplicated: Secondary | ICD-10-CM | POA: Diagnosis not present

## 2018-05-24 DIAGNOSIS — Z79899 Other long term (current) drug therapy: Secondary | ICD-10-CM | POA: Insufficient documentation

## 2018-05-24 DIAGNOSIS — N3000 Acute cystitis without hematuria: Secondary | ICD-10-CM | POA: Diagnosis not present

## 2018-05-24 DIAGNOSIS — J449 Chronic obstructive pulmonary disease, unspecified: Secondary | ICD-10-CM | POA: Insufficient documentation

## 2018-05-24 LAB — CBC WITH DIFFERENTIAL/PLATELET
Abs Immature Granulocytes: 0.03 10*3/uL (ref 0.00–0.07)
Basophils Absolute: 0 10*3/uL (ref 0.0–0.1)
Basophils Relative: 0 %
Eosinophils Absolute: 0 10*3/uL (ref 0.0–0.5)
Eosinophils Relative: 0 %
HCT: 49.3 % — ABNORMAL HIGH (ref 36.0–46.0)
Hemoglobin: 16 g/dL — ABNORMAL HIGH (ref 12.0–15.0)
Immature Granulocytes: 0 %
Lymphocytes Relative: 26 %
Lymphs Abs: 2.6 10*3/uL (ref 0.7–4.0)
MCH: 32.6 pg (ref 26.0–34.0)
MCHC: 32.5 g/dL (ref 30.0–36.0)
MCV: 100.4 fL — ABNORMAL HIGH (ref 80.0–100.0)
Monocytes Absolute: 0.7 10*3/uL (ref 0.1–1.0)
Monocytes Relative: 7 %
Neutro Abs: 6.7 10*3/uL (ref 1.7–7.7)
Neutrophils Relative %: 67 %
Platelets: 284 10*3/uL (ref 150–400)
RBC: 4.91 MIL/uL (ref 3.87–5.11)
RDW: 12.5 % (ref 11.5–15.5)
WBC: 10.1 10*3/uL (ref 4.0–10.5)
nRBC: 0 % (ref 0.0–0.2)

## 2018-05-24 LAB — COMPREHENSIVE METABOLIC PANEL
ALT: 13 U/L (ref 0–44)
AST: 17 U/L (ref 15–41)
Albumin: 4.8 g/dL (ref 3.5–5.0)
Alkaline Phosphatase: 58 U/L (ref 38–126)
Anion gap: 8 (ref 5–15)
BUN: 27 mg/dL — ABNORMAL HIGH (ref 8–23)
CO2: 21 mmol/L — ABNORMAL LOW (ref 22–32)
Calcium: 10.2 mg/dL (ref 8.9–10.3)
Chloride: 107 mmol/L (ref 98–111)
Creatinine, Ser: 1.18 mg/dL — ABNORMAL HIGH (ref 0.44–1.00)
GFR calc Af Amer: 55 mL/min — ABNORMAL LOW (ref >60)
GFR calc non Af Amer: 48 mL/min — ABNORMAL LOW (ref >60)
Glucose, Bld: 129 mg/dL — ABNORMAL HIGH (ref 70–99)
Potassium: 3.9 mmol/L (ref 3.5–5.1)
Sodium: 136 mmol/L (ref 135–145)
Total Bilirubin: 0.8 mg/dL (ref 0.3–1.2)
Total Protein: 8.6 g/dL — ABNORMAL HIGH (ref 6.5–8.1)

## 2018-05-24 LAB — URINALYSIS, ROUTINE W REFLEX MICROSCOPIC
Bilirubin Urine: NEGATIVE
Glucose, UA: NEGATIVE mg/dL
Hgb urine dipstick: NEGATIVE
Ketones, ur: NEGATIVE mg/dL
Leukocytes, UA: NEGATIVE
Nitrite: POSITIVE — AB
Protein, ur: 30 mg/dL — AB
Specific Gravity, Urine: 1.021 (ref 1.005–1.030)
pH: 5 (ref 5.0–8.0)

## 2018-05-24 LAB — MAGNESIUM: Magnesium: 2.1 mg/dL (ref 1.7–2.4)

## 2018-05-24 LAB — LACTIC ACID, PLASMA
Lactic Acid, Venous: 1.4 mmol/L (ref 0.5–1.9)
Lactic Acid, Venous: 0.9 mmol/L (ref 0.5–1.9)

## 2018-05-24 LAB — C DIFFICILE QUICK SCREEN W PCR REFLEX
C Diff antigen: NEGATIVE
C Diff interpretation: NOT DETECTED
C Diff toxin: NEGATIVE

## 2018-05-24 MED ORDER — CEPHALEXIN 500 MG PO CAPS: 500.0000 mg | ORAL_CAPSULE | Freq: Three times a day (TID) | ORAL | 0 refills | Status: DC

## 2018-05-24 MED ORDER — LACTATED RINGERS IV BOLUS
1000.0000 mL | Freq: Once | INTRAVENOUS | Status: AC
  Administered 2018-05-24: 1000 mL via INTRAVENOUS

## 2018-05-24 MED ORDER — MORPHINE SULFATE (PF) 4 MG/ML IV SOLN
4.0000 mg | Freq: Once | INTRAVENOUS | Status: AC
  Administered 2018-05-24: 4 mg via INTRAVENOUS
  Filled 2018-05-24: qty 1

## 2018-05-24 MED ORDER — SODIUM CHLORIDE 0.9 % IV SOLN
1.0000 g | Freq: Once | INTRAVENOUS | Status: AC
  Administered 2018-05-24: 1 g via INTRAVENOUS
  Filled 2018-05-24: qty 10

## 2018-05-24 NOTE — ED Provider Notes (Signed)
[facility] EMERGENCY DEPARTMENT Provider Note   CSN: [id number] Arrival date & time: 05/24/18  1127     History   Chief Complaint Chief Complaint  Patient presents with  . Weakness    HPI Kathleen Burns is a 62 y.o. female.  HPI   62 year old female with generalized weakness.  Progressively worsening over the past weeks to months.  She feels like she has no energy.  She has had persistent diarrhea.  She denies frank blood or melena.  She states that she will have watery stool anytime she eats or drinks.  No nausea or vomiting. She reports significant unintentional weight loss.  She estimates approximately 30 pounds since this summer.  She has a distant history of breast cancer which was treated 30 years ago.  Felt to be in remission.  She continues to smoke.  She is followed by pulmonology for both COPD and pulmonary hypertension.  She says that she is chronically on 3 L of oxygen at baseline.  From a respiratory standpoint she has actually been feeling pretty well recently.  Past Medical History:  Diagnosis Date  . Anxiety and depression    H/o suicide attempt  . Breast cancer ([place])    Left mastectomy in 1990s + chemotherapy  . Chest pain    Cardiac catheterization in 1999, 2003, 2008-normal EF, normal coronaries  . COPD (chronic obstructive pulmonary disease) ([place])   . GERD (gastroesophageal reflux disease)   . Hearing impairment   . Hiatal hernia    large  . Hyperlipidemia   . Hypertension   . Meige disease (lymphedema praecox)    blepharospasm; followed at [facility] And treated with botulinum toxin injections  . Obesity   . Osteoarthritis   . Pulmonary hypertension ([place])   . Skull fracture [facility]) 1969   surgical repair in 1969  . Tobacco abuse    45 pack years    Patient Active Problem List   Diagnosis Date Noted  . Chronic respiratory failure with hypoxia ([place]) 04/21/2017  . Gastritis and gastroduodenitis   . Esophageal stricture 05/27/2016  . Oxygen dependent  05/27/2016  . Constipation 05/27/2016  . Large hiatal hernia 12/07/2015  . Mediastinal lymphadenopathy 11/27/2015  . Dysphagia 11/27/2015  . Tobacco use disorder 03/06/2015  . COPD exacerbation ([place]) 06/21/2013  . 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 Chronic diastolic heart failure ([place]) 09/23/2012  . DOE (dyspnea on exertion) 08/31/2012  . Pulmonary hypertension ([place]) 06/17/2012  . Hyperkalemia 03/05/2012  . Chronic kidney disease 01/28/2012  . Chest pain   . Hypertension   . Meige disease (lymphedema praecox)   . Breast cancer ([place])   . Hyperlipidemia   . Anxiety and depression   . Tobacco abuse   . Obesity     Past Surgical History:  Procedure Laterality Date  . [place] and  . CARPAL TUNNEL RELEASE  1996  . CHOLECYSTECTOMY  1996  . COLONOSCOPY  2011   No significant abnormalities  . ESOPHAGOGASTRODUODENOSCOPY N/A 07/04/2016   Procedure: ESOPHAGOGASTRODUODENOSCOPY (EGD);  Surgeon: Manus Gunning, MD;  Location: [facility];  Service: Gastroenterology;  Laterality: N/A;  . EYE SURGERY  1999, 2001  . INCISIONAL HERNIA REPAIR N/A 10/27/2014   Procedure: Fatima Blank HERNIORRHAPHY WITH MESH;  Surgeon: Aviva Signs Md, MD;  Location: [facility];  Service: General;  Laterality: N/A;  . INSERTION OF MESH N/A 10/27/2014   Procedure: INSERTION OF MESH;  Surgeon: Aviva Signs Md, MD;  Location: [facility]  ORS;  Service: General;  Laterality: N/A;  . KNEE SURGERY  1999   Left  . MASTECTOMY  1990   Left  . SHOULDER SURGERY  1996   Left  . SKULL FRACTURE ELEVATION  1969  . TONSILLECTOMY    . TOTAL ABDOMINAL HYSTERECTOMY W/ BILATERAL SALPINGOOPHORECTOMY  1994     OB History   None      Home Medications    Prior to Admission medications   Medication Sig Start Date End Date Taking? Authorizing Provider  metoprolol tartrate (LOPRESSOR) 25 MG tablet Take 0.5 tablets (12.5 mg total) by mouth 2 (two) times daily. 05/19/18  Yes  Herminio Commons, MD  omeprazole (PRILOSEC) 40 MG capsule TAKE 1 CAPSULE BY MOUTH TWICE A DAY. 04/15/17  Yes Armbruster, Carlota Raspberry, MD    Family History Family History  Problem Relation Age of Onset  . Coronary artery disease Unknown 18       + mother, sister  . Hypertension Unknown        + father, Sister  . Diabetes Unknown        + Sister    Social History Social History   Tobacco Use  . Smoking status: Current Every Day Smoker    Packs/day: 0.50    Years: 30.00    Pack years: 15.00    Types: Cigarettes    Start date: 03/17/1974  . Smokeless tobacco: Never Used  Substance Use Topics  . Alcohol use: No    Alcohol/week: 0.0 standard drinks  . Drug use: Yes    Types: Marijuana     Allergies   Artane [trihexyphenidyl]; Aspirin; Cogentin [benztropine mesylate]; Demerol; Inderal [propranolol hcl]; Levofloxacin; Meperidine; Paxil [paroxetine hcl]; Procardia [nifedipine]; Proparacaine; Benztropine; and Penicillins   Review of Systems Review of Systems  All systems reviewed and negative, other than as noted in HPI.  Physical Exam Updated Vital Signs BP (!) 146/102   Pulse 90   Temp 98.3 F (36.8 C) (Oral)   Resp 19   Ht 5\' 5"  (1.651 m)   Wt 52.6 kg   SpO2 98%   BMI 19.30 kg/m   Physical Exam  Constitutional: She is oriented to person, place, and time. She appears well-developed and well-nourished. No distress.  Laying in bed.  Awake.  Appears tired but not acutely distressed.  HENT:  Head: Normocephalic and atraumatic.  Eyes: Conjunctivae are normal. Right eye exhibits no discharge. Left eye exhibits no discharge.  Neck: Neck supple.  Cardiovascular: Regular rhythm and normal heart sounds. Exam reveals no gallop and no friction rub.  No murmur heard. Mild tachycardia  Pulmonary/Chest: Effort normal and breath sounds normal. No respiratory distress.  Abdominal: Soft. She exhibits no distension. There is no tenderness.  Musculoskeletal: She exhibits no  edema or tenderness.  Neurological: She is alert and oriented to person, place, and time. No cranial nerve deficit. She exhibits normal muscle tone.  Skin: Skin is warm and dry.  Psychiatric: She has a normal mood and affect. Her behavior is normal. Thought content normal.  Nursing note and vitals reviewed.    ED Treatments / Results  Labs (all labs ordered are listed, but only abnormal results are displayed) Labs Reviewed  URINE CULTURE - Abnormal; Notable for the following components:      Result Value   Culture >=100,000 COLONIES/mL GRAM NEGATIVE RODS (*)    All other components within normal limits  COMPREHENSIVE METABOLIC PANEL - Abnormal; Notable for the following components:   CO2 21 (*)  Glucose, Bld 129 (*)    BUN 27 (*)    Creatinine, Ser 1.18 (*)    Total Protein 8.6 (*)    GFR calc non Af Amer 48 (*)    GFR calc Af Amer 55 (*)    All other components within normal limits  CBC WITH DIFFERENTIAL/PLATELET - Abnormal; Notable for the following components:   Hemoglobin 16.0 (*)    HCT 49.3 (*)    MCV 100.4 (*)    All other components within normal limits  URINALYSIS, ROUTINE W REFLEX MICROSCOPIC - Abnormal; Notable for the following components:   APPearance HAZY (*)    Protein, ur 30 (*)    Nitrite POSITIVE (*)    Bacteria, UA MANY (*)    All other components within normal limits  GASTROINTESTINAL PANEL BY PCR, STOOL (REPLACES STOOL CULTURE)  C DIFFICILE QUICK SCREEN W PCR REFLEX  MAGNESIUM  LACTIC ACID, PLASMA  LACTIC ACID, PLASMA    EKG None  Radiology Dg Chest 2 View  Result Date: 05/24/2018 CLINICAL DATA:  Breast cancer COPD.  Rapid weight loss EXAM: CHEST - 2 VIEW COMPARISON:  10/05/2017 FINDINGS: Advanced COPD with pulmonary hyperinflation. Negative for pneumonia. No mass or heart failure identified Large hiatal hernia IMPRESSION: Severe COPD without acute cardiopulmonary abnormality Large hiatal hernia Electronically Signed   By: Franchot Gallo M.D.    On: 05/24/2018 13:47    Procedures Procedures (including critical care time)  Medications Ordered in ED Medications  cefTRIAXone (ROCEPHIN) 1 g in sodium chloride 0.9 % 100 mL IVPB (has no administration in time range)  lactated ringers bolus 1,000 mL (1,000 mLs Intravenous New Bag/Given 05/24/18 1328)     Initial Impression / Assessment and Plan / ED Course  I have reviewed the triage vital signs and the nursing notes.  Pertinent labs & imaging results that were available during my care of the patient were reviewed by me and considered in my medical decision making (see chart for details).     62 year old female with generalized weakness and weight loss.  She is now feeling better after IV fluids.  Noted to have urinary tract infection.  Given Rocephin.  Will be continued on Keflex.  She is anxious to be discharged.  At this time, I think she is medically fine to do so.  Emergent return precautions were discussed.  Discussed the need to follow-up with her PCP.  Final Clinical Impressions(s) / ED Diagnoses   Final diagnoses:  Weakness  Acute cystitis without hematuria    ED Discharge Orders    None       Virgel Manifold, MD 05/26/18 [phone]

## 2018-05-24 NOTE — ED Triage Notes (Signed)
Pt reports generalized weakness with diarrhea for several months which has worsened over the past 2 weeks.  States she has lost over 30 pounds since august.

## 2018-05-25 LAB — GASTROINTESTINAL PANEL BY PCR, STOOL (REPLACES STOOL CULTURE)

## 2018-05-27 LAB — URINE CULTURE: Culture: >=100000 — AB

## 2018-07-28 ENCOUNTER — Inpatient Hospital Stay: Admission: RE | Admit: 2018-07-28 | Payer: 59 | Source: Ambulatory Visit

## 2019-06-18 ENCOUNTER — Other Ambulatory Visit: Payer: Self-pay

## 2019-06-18 ENCOUNTER — Emergency Department (HOSPITAL_COMMUNITY): Payer: 59

## 2019-06-18 ENCOUNTER — Encounter (HOSPITAL_COMMUNITY): Payer: Self-pay

## 2019-06-18 ENCOUNTER — Emergency Department (HOSPITAL_COMMUNITY)
Admission: EM | Admit: 2019-06-18 | Discharge: 2019-06-18 | Disposition: A | Discharge status: Home / Self Care | Payer: 59 | Attending: Emergency Medicine | Admitting: Emergency Medicine

## 2019-06-18 DIAGNOSIS — N189 Chronic kidney disease, unspecified: Secondary | ICD-10-CM | POA: Insufficient documentation

## 2019-06-18 DIAGNOSIS — F17210 Nicotine dependence, cigarettes, uncomplicated: Secondary | ICD-10-CM | POA: Diagnosis not present

## 2019-06-18 DIAGNOSIS — I5032 Chronic diastolic (congestive) heart failure: Secondary | ICD-10-CM | POA: Insufficient documentation

## 2019-06-18 DIAGNOSIS — Z853 Personal history of malignant neoplasm of breast: Secondary | ICD-10-CM | POA: Diagnosis not present

## 2019-06-18 DIAGNOSIS — J449 Chronic obstructive pulmonary disease, unspecified: Secondary | ICD-10-CM | POA: Insufficient documentation

## 2019-06-18 DIAGNOSIS — R112 Nausea with vomiting, unspecified: Secondary | ICD-10-CM

## 2019-06-18 DIAGNOSIS — N3000 Acute cystitis without hematuria: Secondary | ICD-10-CM | POA: Insufficient documentation

## 2019-06-18 DIAGNOSIS — R197 Diarrhea, unspecified: Secondary | ICD-10-CM

## 2019-06-18 DIAGNOSIS — Z79899 Other long term (current) drug therapy: Secondary | ICD-10-CM | POA: Insufficient documentation

## 2019-06-18 DIAGNOSIS — I130 Hypertensive heart and chronic kidney disease with heart failure and stage 1 through stage 4 chronic kidney disease, or unspecified chronic kidney disease: Secondary | ICD-10-CM | POA: Diagnosis not present

## 2019-06-18 LAB — CBC WITH DIFFERENTIAL/PLATELET
Abs Immature Granulocytes: 0.04 10*3/uL (ref 0.00–0.07)
Basophils Absolute: 0.1 10*3/uL (ref 0.0–0.1)
Basophils Relative: 1 %
Eosinophils Absolute: 0 10*3/uL (ref 0.0–0.5)
Eosinophils Relative: 0 %
HCT: 46 % (ref 36.0–46.0)
Hemoglobin: 15.6 g/dL — ABNORMAL HIGH (ref 12.0–15.0)
Immature Granulocytes: 0 %
Lymphocytes Relative: 27 %
Lymphs Abs: 2.7 10*3/uL (ref 0.7–4.0)
MCH: 32.4 pg (ref 26.0–34.0)
MCHC: 33.9 g/dL (ref 30.0–36.0)
MCV: 95.4 fL (ref 80.0–100.0)
Monocytes Absolute: 0.8 10*3/uL (ref 0.1–1.0)
Monocytes Relative: 8 %
Neutro Abs: 6.5 10*3/uL (ref 1.7–7.7)
Neutrophils Relative %: 64 %
Platelets: 269 10*3/uL (ref 150–400)
RBC: 4.82 MIL/uL (ref 3.87–5.11)
RDW: 11.7 % (ref 11.5–15.5)
WBC: 10.1 10*3/uL (ref 4.0–10.5)
nRBC: 0 % (ref 0.0–0.2)

## 2019-06-18 LAB — COMPREHENSIVE METABOLIC PANEL
ALT: 16 U/L (ref 0–44)
AST: 18 U/L (ref 15–41)
Albumin: 4.3 g/dL (ref 3.5–5.0)
Alkaline Phosphatase: 84 U/L (ref 38–126)
Anion gap: 7 (ref 5–15)
BUN: 42 mg/dL — ABNORMAL HIGH (ref 8–23)
CO2: 23 mmol/L (ref 22–32)
Calcium: 9.7 mg/dL (ref 8.9–10.3)
Chloride: 112 mmol/L — ABNORMAL HIGH (ref 98–111)
Creatinine, Ser: 1.35 mg/dL — ABNORMAL HIGH (ref 0.44–1.00)
GFR calc Af Amer: 48 mL/min — ABNORMAL LOW (ref >60)
GFR calc non Af Amer: 41 mL/min — ABNORMAL LOW (ref >60)
Glucose, Bld: 128 mg/dL — ABNORMAL HIGH (ref 70–99)
Potassium: 3.8 mmol/L (ref 3.5–5.1)
Sodium: 142 mmol/L (ref 135–145)
Total Bilirubin: 0.7 mg/dL (ref 0.3–1.2)
Total Protein: 7.9 g/dL (ref 6.5–8.1)

## 2019-06-18 LAB — URINALYSIS, ROUTINE W REFLEX MICROSCOPIC
Bilirubin Urine: NEGATIVE
Glucose, UA: NEGATIVE mg/dL
Hgb urine dipstick: NEGATIVE
Ketones, ur: 5 mg/dL — AB
Leukocytes,Ua: NEGATIVE
Nitrite: POSITIVE — AB
Protein, ur: 30 mg/dL — AB
Specific Gravity, Urine: 1.021 (ref 1.005–1.030)
pH: 6 (ref 5.0–8.0)

## 2019-06-18 LAB — TROPONIN I (HIGH SENSITIVITY)
Troponin I (High Sensitivity): 15 ng/L (ref <18)
Troponin I (High Sensitivity): 14 ng/L (ref <18)

## 2019-06-18 LAB — LIPASE, BLOOD: Lipase: 92 U/L — ABNORMAL HIGH (ref 11–51)

## 2019-06-18 MED ORDER — ONDANSETRON HCL 4 MG/2ML IJ SOLN
4.0000 mg | Freq: Once | INTRAMUSCULAR | Status: AC
  Administered 2019-06-18: 4 mg via INTRAVENOUS
  Filled 2019-06-18: qty 2

## 2019-06-18 MED ORDER — ONDANSETRON HCL 4 MG PO TABS: 4.0000 mg | ORAL_TABLET | Freq: Four times a day (QID) | ORAL | 0 refills | Status: DC | PRN

## 2019-06-18 MED ORDER — SODIUM CHLORIDE 0.9 % IV SOLN
1.0000 g | Freq: Once | INTRAVENOUS | Status: AC
  Administered 2019-06-18: 1 g via INTRAVENOUS
  Filled 2019-06-18: qty 10

## 2019-06-18 MED ORDER — SODIUM CHLORIDE 0.9 % IV BOLUS
1000.0000 mL | Freq: Once | INTRAVENOUS | Status: AC
  Administered 2019-06-18: 1000 mL via INTRAVENOUS

## 2019-06-18 MED ORDER — IOHEXOL 300 MG/ML  SOLN
80.0000 mL | Freq: Once | INTRAMUSCULAR | Status: AC | PRN
  Administered 2019-06-18: 80 mL via INTRAVENOUS

## 2019-06-18 MED ORDER — CEPHALEXIN 500 MG PO CAPS: ORAL_CAPSULE | ORAL | 0 refills | Status: DC

## 2019-06-18 MED ORDER — DICYCLOMINE HCL 20 MG PO TABS: 20.0000 mg | ORAL_TABLET | Freq: Two times a day (BID) | ORAL | 0 refills | Status: DC

## 2019-06-18 NOTE — ED Notes (Signed)
Pt states she has called her niece to pick her up. States she wants to go home.

## 2019-06-18 NOTE — ED Provider Notes (Signed)
[facility] Provider Note   CSN: [id number] Arrival date & time: 06/18/19  1323     History Chief Complaint  Patient presents with  . Nausea  . Emesis  . Diarrhea    Kathleen Burns is a 63 y.o. female who  has a past medical history of Anxiety and depression, Breast cancer ([place]), Chest pain, COPD (chronic obstructive pulmonary disease) ([place]), GERD (gastroesophageal reflux disease), Hearing impairment, Hiatal hernia, Hyperlipidemia, Hypertension, Meige disease (lymphedema praecox), Obesity, Osteoarthritis, Pulmonary hypertension ([place]), Skull fracture ([place]) (1969), and Tobacco abuse. She presents with 3 days of N/V/D. She has been unable to hold down any foods or fluid. She complains of CP, abdominal cramping and tenesmus. She denies cough or known exposure to covid. She denies fevers  The history is provided by the patient and medical records.  Emesis Associated symptoms: diarrhea   Diarrhea Associated symptoms: vomiting        Past Medical History:  Diagnosis Date  . Anxiety and depression    H/o suicide attempt  . Breast cancer ([place])    Left mastectomy in 1990s + chemotherapy  . Chest pain    Cardiac catheterization in 1999, 2003, 2008-normal EF, normal coronaries  . COPD (chronic obstructive pulmonary disease) ([place])   . GERD (gastroesophageal reflux disease)   . Hearing impairment   . Hiatal hernia    large  . Hyperlipidemia   . Hypertension   . Meige disease (lymphedema praecox)    blepharospasm; [facility] at [facility] And treated with botulinum toxin injections  . Obesity   . Osteoarthritis   . Pulmonary hypertension ([place])   . Skull fracture [facility]) 1969   surgical repair in 1969  . Tobacco abuse    45 pack years    Patient Active Problem List   Diagnosis Date Noted  . Chronic respiratory failure with hypoxia ([place]) 04/21/2017  . Gastritis and gastroduodenitis   . Esophageal stricture 05/27/2016  . Oxygen dependent 05/27/2016  . Constipation  05/27/2016  . Large hiatal hernia 12/07/2015  . Mediastinal lymphadenopathy 11/27/2015  . Dysphagia 11/27/2015  . Tobacco use disorder 03/06/2015  . COPD exacerbation ([place]) 06/21/2013  . 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 Chronic diastolic heart failure ([place]) 09/23/2012  . DOE (dyspnea on exertion) 08/31/2012  . Pulmonary hypertension ([place]) 06/17/2012  . Hyperkalemia 03/05/2012  . Chronic kidney disease 01/28/2012  . Chest pain   . Hypertension   . Meige disease (lymphedema praecox)   . Breast cancer ([place])   . Hyperlipidemia   . Anxiety and depression   . Tobacco abuse   . Obesity     Past Surgical History:  Procedure Laterality Date  . [place] and  . CARPAL TUNNEL RELEASE  1996  . CHOLECYSTECTOMY  1996  . COLONOSCOPY  2011   No significant abnormalities  . ESOPHAGOGASTRODUODENOSCOPY N/A 07/04/2016   Procedure: ESOPHAGOGASTRODUODENOSCOPY (EGD);  Surgeon: Manus Gunning, MD;  Location[facility];  Service: Gastroenterology;  Laterality: N/A;  . EYE SURGERY  1999, 2001  . INCISIONAL HERNIA REPAIR N/A 10/27/2014   Procedure: Fatima Blank HERNIORRHAPHY WITH MESH;  Surgeon: Aviva Signs Md, MD;  Location: [facility];  Service: General;  Laterality: N/A;  . INSERTION OF MESH N/A 10/27/2014   Procedure: INSERTION OF MESH;  Surgeon: Aviva Signs Md, MD;  Location: [facility];  Service: General;  Laterality: N/A;  . KNEE SURGERY  1999   Left  . MASTECTOMY  1990   Left  .  SHOULDER SURGERY  1996   Left  . SKULL FRACTURE ELEVATION  1969  . TONSILLECTOMY    . TOTAL ABDOMINAL HYSTERECTOMY W/ BILATERAL SALPINGOOPHORECTOMY  1994     OB History   No obstetric history on file.     Family History  Problem Relation Age of Onset  . Coronary artery disease Other 47       + mother, sister  . Hypertension Other        + father, Sister  . Diabetes Other        + Sister    Social History   Tobacco Use  . Smoking status:  Current Every Day Smoker    Packs/day: 0.50    Years: 30.00    Pack years: 15.00    Types: Cigarettes    Start date: 03/17/1974  . Smokeless tobacco: Never Used  Substance Use Topics  . Alcohol use: No    Alcohol/week: 0.0 standard drinks  . Drug use: Yes    Types: Marijuana    Home Medications Prior to Admission medications   Medication Sig Start Date End Date Taking? Authorizing Provider  cephALEXin (KEFLEX) 500 MG capsule Take 1 capsule (500 mg total) by mouth 3 (three) times daily. 05/24/18   Virgel Manifold, MD  metoprolol tartrate (LOPRESSOR) 25 MG tablet Take 0.5 tablets (12.5 mg total) by mouth 2 (two) times daily. 05/19/18   Herminio Commons, MD  omeprazole (PRILOSEC) 40 MG capsule TAKE 1 CAPSULE BY MOUTH TWICE A DAY. 04/15/17   Armbruster, Carlota Raspberry, MD    Allergies    Artane [trihexyphenidyl], Aspirin, Cogentin [benztropine mesylate], Demerol, Inderal [propranolol hcl], Levofloxacin, Meperidine, Paxil [paroxetine hcl], Procardia [nifedipine], Proparacaine, Benztropine, and Penicillins  Review of Systems   Review of Systems  Gastrointestinal: Positive for diarrhea and vomiting.   Ten systems reviewed and are negative for acute change, except as noted in the HPI.   Physical Exam Updated Vital Signs BP 134/75   Pulse (!) 111   Temp 97.7 F (36.5 C) (Oral)   Resp (!) 21   Ht 5\' 6"  (1.676 m)   Wt 52.2 kg   SpO2 100%   BMI 18.56 kg/m   Physical Exam Vitals and nursing note reviewed.  Constitutional:      General: She is not in acute distress.    Appearance: She is underweight. She is ill-appearing. She is not toxic-appearing or diaphoretic.  HENT:     Head: Normocephalic and atraumatic.  Eyes:     General: No scleral icterus.    Conjunctiva/sclera: Conjunctivae normal.  Cardiovascular:     Rate and Rhythm: Normal rate and regular rhythm.     Heart sounds: Normal heart sounds. No murmur. No friction rub. No gallop.   Pulmonary:     Effort: Pulmonary  effort is normal. No respiratory distress.     Breath sounds: Normal breath sounds.  Abdominal:     General: Bowel sounds are normal. There is no distension.     Palpations: Abdomen is soft. There is no mass.     Tenderness: There is no abdominal tenderness. There is no guarding.  Musculoskeletal:     Cervical back: Normal range of motion.  Skin:    General: Skin is warm and dry.  Neurological:     Mental Status: She is alert and oriented to person, place, and time.  Psychiatric:        Behavior: Behavior normal.      ED Results / Procedures / Treatments  Labs (all labs ordered are listed, but only abnormal results are displayed) Labs Reviewed  CBC WITH DIFFERENTIAL/PLATELET  COMPREHENSIVE METABOLIC PANEL  LIPASE, BLOOD  URINALYSIS, ROUTINE W REFLEX MICROSCOPIC  POC SARS CORONAVIRUS 2 AG -  ED  TROPONIN I (HIGH SENSITIVITY)    EKG None ECG interpretation   Date: 06/18/2019  Rate: 103  Rhythm: Sinus tachycardia  QRS Axis: normal  Intervals: normal  ST/T Wave abnormalities: normal  Conduction Disutrbances: none  Narrative Interpretation:   Radiology No results found.  Procedures Procedures (including critical care time)  Medications Ordered in ED Medications  ondansetron (ZOFRAN) injection 4 mg (has no administration in time range)  sodium chloride 0.9 % bolus 1,000 mL (has no administration in time range)    ED Course  I have reviewed the triage vital signs and the nursing notes.  Pertinent labs & imaging results that were available during my care of the patient were reviewed by me and considered in my medical decision making (see chart for details).    MDM Rules/Calculators/A&P                       Final Clinical Impression(s) / ED Diagnoses Final diagnoses:  Acute cystitis without hematuria  Nausea vomiting and diarrhea   CC:n/v/d VS:  Vitals:   06/18/19 1343 06/18/19 1344 06/18/19 1400  BP: 116/84  134/75  Pulse: (!) 111    Resp: 16  (!)  21  Temp: 97.7 F (36.5 C)    TempSrc: Oral    SpO2: 100%    Weight:  52.2 kg   Height:  5\' 6"  (1.676 m)     KN:LZJQBHA is gathered by patient and EMR. DDX:The emergent differential diagnosis for vomiting includes, but is not limited to ACS/MI, Boerhaave's, DKA, Intracranial Hemorrhage, Ischemic bowel, Meningitis, Sepsis, Acute radiation syndrome, Acute gastric dilation, Acetaminophen toxicity, Adrenal insufficiency, Appendicitis, Aspirin toxicity, Bowel obstruction/ileus, Carbon monoxide poisoning, Cholecystitis, CNS tumor. Digoxin toxicity, Electrolyte abnormalities, Elevated ICP, Gastric outlet obstruction, Hyperemesis gravidarum, Pancreatitis, Peritonitis, Ruptured viscus, Testicular torsion/ovarian torsion, Theophyline toxicity, Biliary colic, Cannabinoid hyperemesis syndrome, Chemotherapy, Disulfiram effect, Erythromycin, ETOH, Gastritis, Gastroenteritis, Gastroparesis, Hepatitis, Ibuprofen, Ipecac toxicity, Labyrinthitis, Migraine, Motion sickness, Narcotic withdrawal, Thyroid, Pregnancy, Peptic ulcer disease, Renal colic, and UTI Labs: I reviewed the labs which show elevated lipase of insignificant value.  CBC shows no elevated white blood Cell count.  Hemoglobin elevated likely secondary to smoking.  CMP shows mildly elevated BUN and creatinine and cycling of several days of vomiting.  Covid test is pending.  UA is positive for infection with nitrites and bacteria. Imaging: I personally reviewed the images (CT abdomen pelvis) which show(s) no acute abnormalities EKG: Sinus tachycardia with a rate of 103 MDM: Patient with nausea and vomiting.  Her urine is positive for infection.  Treated here with Rocephin.  Discharged home with Keflex.  She has been able to tolerate food and fluids by mouth here.  Abdominal exam benign. Patient disposition: Discharge Patient condition: Good. The patient appears reasonably screened and/or stabilized for discharge and I doubt any other medical condition or  other [facility] requiring further screening, evaluation, or treatment in the ED at this time prior to discharge. I have discussed lab and/or imaging findings with the patient and answered all questions/concerns to the best of my ability. I have discussed return precautions and OP follow up.     Rx / DC Orders ED Discharge Orders    None       Margarita Mail,  PA-C 06/18/19 2202    Milton Ferguson, MD 06/22/19 [phone]

## 2019-06-18 NOTE — Discharge Instructions (Signed)
Contact a health care provider if:  Your symptoms do not get better after 1-2 days.  Your symptoms go away and then return.  Get help right away if you have:  Severe pain in your back or your lower abdomen.  A fever.  Nausea or vomiting.

## 2019-06-18 NOTE — ED Notes (Addendum)
Pt stated she wanted to leave AMA. Spoke with PA who stated that it was okay she left with her AVS and Rx.

## 2019-06-18 NOTE — ED Triage Notes (Addendum)
Pt states she is out of her BP medication, and back pain medication. States she has no transportation to retrieve medications. Complaining of nausea and diarrhea since Sunday.   Per EMS pt needs SW consult. Pt states she has no transportation, food, or meds.   Upon arrival , pt states "I am very hardheaded". Pt states she has lost 65 lbs in the last year due to insufficient food supply and her stove is broken. Pt arrived at ED without shoes. States she has no idea where her shoes are.   Pt complaining of chest pressure when taking deep breaths. Expiratory wheezing noted.

## 2019-06-18 NOTE — ED Notes (Addendum)
Pt refused final VS and to sign AMA paperwork

## 2019-06-21 LAB — URINE CULTURE: Culture: >=100000 — AB

## 2019-06-22 ENCOUNTER — Telehealth: Payer: Self-pay

## 2019-06-22 NOTE — Telephone Encounter (Signed)
Post ED Visit - Positive Culture Follow-up  Culture report reviewed by antimicrobial stewardship pharmacist: [place] Team []  Elenor Quinones, Pharm.D. []  Heide Guile, Pharm.D., BCPS AQ-ID []  Parks Neptune, Pharm.D., BCPS []  Alycia Rossetti, Pharm.D., BCPS []  [place], Pharm.D., BCPS, AAHIVP []  Legrand Como, Pharm.D., BCPS, AAHIVP []  Salome Arnt, PharmD, BCPS []  Johnnette Gourd, PharmD, BCPS []  Hughes Better, PharmD, BCPS []  Leeroy Cha, PharmD [x]  Laqueta Linden, PharmD, BCPS []  Albertina Parr, PharmD  [place] Team []  Leodis Sias, PharmD []  Lindell Spar, PharmD []  Royetta Asal, PharmD []  Graylin Shiver, Rph []  Rema Fendt) Glennon Mac, PharmD []  Arlyn Dunning, PharmD []  Netta Cedars, PharmD []  Dia Sitter, PharmD []  Leone Haven, PharmD []  Gretta Arab, PharmD []  Theodis Shove, PharmD []  Peggyann Juba, PharmD []  Reuel Boom, PharmD   Positive urine culture Treated with Cephalexin, organism sensitive to the same and no further patient follow-up is required at this time.  [name] 06/22/2019, 11:27 AM

## 2020-11-28 DEATH — deceased
